# Patient Record
Sex: Female | Born: 1990
Health system: Southern US, Community
[De-identification: ages and names within clinical notes are randomized; demographics above are authoritative.]

## PROBLEM LIST (undated history)

## (undated) DIAGNOSIS — J45909 Unspecified asthma, uncomplicated: Secondary | ICD-10-CM

## (undated) DIAGNOSIS — F32A Depression, unspecified: Secondary | ICD-10-CM

## (undated) DIAGNOSIS — K219 Gastro-esophageal reflux disease without esophagitis: Secondary | ICD-10-CM

## (undated) DIAGNOSIS — F329 Major depressive disorder, single episode, unspecified: Secondary | ICD-10-CM

## (undated) DIAGNOSIS — E282 Polycystic ovarian syndrome: Secondary | ICD-10-CM

## (undated) DIAGNOSIS — F419 Anxiety disorder, unspecified: Secondary | ICD-10-CM

## (undated) DIAGNOSIS — F909 Attention-deficit hyperactivity disorder, unspecified type: Secondary | ICD-10-CM

## (undated) DIAGNOSIS — K589 Irritable bowel syndrome without diarrhea: Secondary | ICD-10-CM

## (undated) HISTORY — DX: Depression, unspecified: F32.A

## (undated) HISTORY — DX: Irritable bowel syndrome, unspecified: K58.9

## (undated) HISTORY — DX: Attention-deficit hyperactivity disorder, unspecified type: F90.9

## (undated) HISTORY — DX: Unspecified asthma, uncomplicated: J45.909

## (undated) HISTORY — DX: Gastro-esophageal reflux disease without esophagitis: K21.9

## (undated) HISTORY — DX: Polycystic ovarian syndrome: E28.2

## (undated) HISTORY — DX: Major depressive disorder, single episode, unspecified: F32.9

---
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
## 2014-03-07 ENCOUNTER — Other Ambulatory Visit: Payer: Self-pay | Admitting: Family Medicine

## 2014-03-07 ENCOUNTER — Other Ambulatory Visit (HOSPITAL_COMMUNITY)
Admission: RE | Admit: 2014-03-07 | Discharge: 2014-03-07 | Disposition: A | Discharge status: Home / Self Care | Payer: Commercial Managed Care - PPO | Source: Ambulatory Visit | Attending: Family Medicine | Admitting: Family Medicine

## 2014-03-07 DIAGNOSIS — Z113 Encounter for screening for infections with a predominantly sexual mode of transmission: Secondary | ICD-10-CM | POA: Diagnosis present

## 2014-03-07 DIAGNOSIS — Z124 Encounter for screening for malignant neoplasm of cervix: Secondary | ICD-10-CM | POA: Insufficient documentation

## 2014-03-08 LAB — CYTOLOGY - PAP

## 2014-10-17 ENCOUNTER — Ambulatory Visit: Payer: Self-pay | Admitting: Family Medicine

## 2014-10-25 ENCOUNTER — Encounter: Payer: Self-pay | Admitting: Family Medicine

## 2014-10-25 ENCOUNTER — Encounter (INDEPENDENT_AMBULATORY_CARE_PROVIDER_SITE_OTHER): Payer: Self-pay

## 2014-10-25 ENCOUNTER — Ambulatory Visit (INDEPENDENT_AMBULATORY_CARE_PROVIDER_SITE_OTHER): Payer: 59 | Admitting: Family Medicine

## 2014-10-25 VITALS — BP 121/72 | HR 88 | Ht 66.0 in | Wt 198.0 lb

## 2014-10-25 DIAGNOSIS — M25562 Pain in left knee: Secondary | ICD-10-CM

## 2014-10-25 NOTE — Patient Instructions (Signed)
You have patellofemoral syndrome and pes planus (flat feet). Avoid painful activities (especially squats and lunges, plyometrics, increasing running mileage) as much as possible. Straight leg raise, hip side raise, straight leg raise with foot turned outwards 3 sets of 10 once a day.  Add ankle weight if these become too easy. Consider formal physical therapy Correct foot breakdown with dr. Jari Sportsmanscholls active series or the sports insoles with scaphoid pads. Avoid barefoot walking, flat shoes as much as possible. Icing 15 minutes at a time 3-4 times a day as needed. Tylenol or aleve as needed for pain Try a small toe spacer on the right foot between 4th and 5th toes. Consider custom orthotics if you do well with these ones also. Follow up with me in 5-6 weeks.

## 2014-10-27 ENCOUNTER — Encounter: Payer: Self-pay | Admitting: Family Medicine

## 2014-10-27 DIAGNOSIS — M25562 Pain in left knee: Secondary | ICD-10-CM | POA: Insufficient documentation

## 2014-10-27 NOTE — Assessment & Plan Note (Signed)
consistent with patellofemoral syndrome.  Has pes planus which is contributing to this and her foot pain.  Shown home exercises to do daily.  Arch support.  Avoid barefoot walking/flat shoes.  Icing, tylenol/nsaids.  Consider custom orthotics, PT if not improving.  F/u in 5-6 weeks.

## 2014-10-27 NOTE — Progress Notes (Signed)
PCP: No primary care provider on file.  Subjective:   HPI: Patient is a 24 y.o. female here for left knee pain.  Patient reports for several months to years she's had off and on left knee pain. No known injury or trauma. No swelling. Pain worse going down stairs and after running. Pain is primarily around kneecap, patellar tendon area. No catching, locking. Has been icing, using heat. Brace helped. ALso with pain in the ball of her feet.  Past Medical History  Diagnosis Date  . IBS (irritable bowel syndrome)     No current outpatient prescriptions on file prior to visit.   No current facility-administered medications on file prior to visit.    No past surgical history on file.  No Known Allergies  History   Social History  . Marital Status: Single    Spouse Name: N/A  . Number of Children: N/A  . Years of Education: N/A   Occupational History  . Not on file.   Social History Main Topics  . Smoking status: Never Smoker   . Smokeless tobacco: Not on file  . Alcohol Use: Not on file  . Drug Use: Not on file  . Sexual Activity: Not on file   Other Topics Concern  . Not on file   Social History Narrative    No family history on file.  BP 121/72 mmHg  Pulse 88  Ht 5\' 6"  (1.676 m)  Wt 198 lb (89.812 kg)  BMI 31.97 kg/m2  Review of Systems: See HPI above.    Objective:  Physical Exam:  Gen: NAD  Pes planus  Left knee: No gross deformity, ecchymoses, effusion.  VMO atrophy No TTP currently. FROM. Negative ant/post drawers. Negative valgus/varus testing. Negative lachmanns. Negative mcmurrays, apleys, patellar apprehension. Hip abduction 4/5 strength. NV intact distally.    Assessment & Plan:  1. Left knee pain - consistent with patellofemoral syndrome.  Has pes planus which is contributing to this and her foot pain.  Shown home exercises to do daily.  Arch support.  Avoid barefoot walking/flat shoes.  Icing, tylenol/nsaids.  Consider custom  orthotics, PT if not improving.  F/u in 5-6 weeks.

## 2014-11-01 ENCOUNTER — Encounter: Payer: Self-pay | Admitting: Family Medicine

## 2014-11-01 ENCOUNTER — Ambulatory Visit (INDEPENDENT_AMBULATORY_CARE_PROVIDER_SITE_OTHER): Payer: 59 | Admitting: Family Medicine

## 2014-11-01 VITALS — BP 116/73 | HR 71 | Ht 66.0 in | Wt 198.0 lb

## 2014-11-01 DIAGNOSIS — M25562 Pain in left knee: Secondary | ICD-10-CM

## 2014-11-02 ENCOUNTER — Encounter: Payer: Self-pay | Admitting: Family Medicine

## 2014-11-02 NOTE — Progress Notes (Signed)
Patient ID: Laura Perez, female   DOB: 27-Nov-1990, 24 y.o.   MRN: [id number] PCP: Cala Bradford, MD  Subjective:   HPI: Patient is a 24 y.o. female here for orthotics.  6/1: Patient reports for several months to years she's had off and on left knee pain. No known injury or trauma. No swelling. Pain worse going down stairs and after running. Pain is primarily around kneecap, patellar tendon area. No catching, locking. Has been icing, using heat. Brace helped. ALso with pain in the ball of her feet.  6/8: Patient reports exercises and sports insoles are helping - would like to get custom orthotics today.  Past Medical History  Diagnosis Date  . IBS (irritable bowel syndrome)     Current Outpatient Prescriptions on File Prior to Visit  Medication Sig Dispense Refill  . hyoscyamine (LEVSIN, ANASPAZ) 0.125 MG tablet Take 0.125 mg by mouth every 4 (four) hours as needed.     No current facility-administered medications on file prior to visit.    History reviewed. No pertinent past surgical history.  No Known Allergies  History   Social History  . Marital Status: Single    Spouse Name: N/A  . Number of Children: N/A  . Years of Education: N/A   Occupational History  . Not on file.   Social History Main Topics  . Smoking status: Never Smoker   . Smokeless tobacco: Not on file  . Alcohol Use: Not on file  . Drug Use: Not on file  . Sexual Activity: Not on file   Other Topics Concern  . Not on file   Social History Narrative    No family history on file.  BP 116/73 mmHg  Pulse 71  Ht 5\' 6"  (1.676 m)  Wt 198 lb (89.812 kg)  BMI 31.97 kg/m2  Review of Systems: See HPI above.    Objective:  Physical Exam:  Gen: NAD  Pes planus No hallux valgus or rigidus. No transverse arch collapse. Leg lengths equal.  Left knee - not reexamined today. No gross deformity, ecchymoses, effusion.  VMO atrophy No TTP currently. FROM. Negative ant/post drawers.  Negative valgus/varus testing. Negative lachmanns. Negative mcmurrays, apleys, patellar apprehension. Hip abduction 4/5 strength. NV intact distally.    Assessment & Plan:  1. Patellofemoral syndrome, pes planus, gait abnormality - custom orthotics made today. Patient was fitted for a : standard, cushioned, semi-rigid orthotic. The orthotic was heated and afterward the patient stood on the orthotic blank positioned on the orthotic stand. The patient was positioned in subtalar neutral position and 10 degrees of ankle dorsiflexion in a weight bearing stance. After completion of molding, a stable base was applied to the orthotic blank. The blank was ground to a stable position for weight bearing. Size: 8  Base: blue med density eva Posting: none Additional orthotic padding: none Total prep time 40 minutes.

## 2014-11-02 NOTE — Assessment & Plan Note (Signed)
With pes planus, gait abnormality - custom orthotics made today. Patient was fitted for a : standard, cushioned, semi-rigid orthotic. The orthotic was heated and afterward the patient stood on the orthotic blank positioned on the orthotic stand. The patient was positioned in subtalar neutral position and 10 degrees of ankle dorsiflexion in a weight bearing stance. After completion of molding, a stable base was applied to the orthotic blank. The blank was ground to a stable position for weight bearing. Size: 8  Base: blue med density eva Posting: none Additional orthotic padding: none Total prep time 40 minutes.

## 2014-11-10 ENCOUNTER — Encounter: Payer: Self-pay | Admitting: Internal Medicine

## 2014-12-11 ENCOUNTER — Encounter: Payer: Self-pay | Admitting: Emergency Medicine

## 2014-12-11 ENCOUNTER — Emergency Department
Admission: EM | Admit: 2014-12-11 | Discharge: 2014-12-11 | Disposition: A | Discharge status: Home / Self Care | Payer: 59 | Source: Home / Self Care

## 2014-12-11 DIAGNOSIS — H9202 Otalgia, left ear: Secondary | ICD-10-CM

## 2014-12-11 DIAGNOSIS — H6982 Other specified disorders of Eustachian tube, left ear: Secondary | ICD-10-CM | POA: Diagnosis not present

## 2014-12-11 HISTORY — DX: Anxiety disorder, unspecified: F41.9

## 2014-12-11 MED ORDER — HYDROCORTISONE-ACETIC ACID 1-2 % OT SOLN: 4.0000 [drp] | Freq: Three times a day (TID) | OTIC | Status: DC

## 2014-12-11 MED ORDER — PREDNISONE 20 MG PO TABS: 20.0000 mg | ORAL_TABLET | Freq: Two times a day (BID) | ORAL | Status: DC

## 2014-12-11 NOTE — ED Provider Notes (Signed)
CSN: [id number]     Arrival date & time 12/11/14  1429 History   None    Chief Complaint  Patient presents with  . Otalgia      HPI Comments: Patient complains of itching and pain in her left ear during the past three days.  No nasal congestion, sore throat, or fever.  She reports that she had been swimming last week.  No drainage from her left ear canal.  The history is provided by the patient.    Past Medical History  Diagnosis Date  . Anxiety   . IBS (irritable bowel syndrome)    History reviewed. No pertinent past surgical history. No family history on file. History  Substance Use Topics  . Smoking status: Never Smoker   . Smokeless tobacco: Not on file  . Alcohol Use: No   OB History    No data available     Review of Systems No sore throat No cough No pleuritic pain No wheezing No nasal congestion No post-nasal drainage No sinus pain/pressure No itchy/red eyes ? earache No hemoptysis No SOB No fever/chills No nausea No vomiting No abdominal pain No diarrhea No urinary symptoms No skin rash No fatigue No myalgias No headache    Allergies  Review of patient's allergies indicates no known allergies.  Home Medications   Prior to Admission medications   Medication Sig Start Date End Date Taking? Authorizing Provider  hyoscyamine (LEVSIN, ANASPAZ) 0.125 MG tablet Take 0.125 mg by mouth every 4 (four) hours as needed.   Yes Historical Provider, MD  acetic acid-hydrocortisone (VOSOL-HC) otic solution Place 4 drops into the left ear 3 (three) times daily. Use for 3 to 5 days until itching resolves. 12/11/14   Lattie HawStephen A Zosia Lucchese, MD  predniSONE (DELTASONE) 20 MG tablet Take 1 tablet (20 mg total) by mouth 2 (two) times daily. Take with food. 12/11/14   Lattie HawStephen A Tierra Divelbiss, MD   BP 117/75 mmHg  Pulse 75  Temp(Src) 98.1 F (36.7 C) (Oral)  Ht 5\' 6"  (1.676 m)  Wt 211 lb (95.709 kg)  BMI 34.07 kg/m2  SpO2 98%  LMP 11/09/2014 Physical Exam Nursing notes and  Vital Signs reviewed. Appearance:  Patient appears stated age, and in no acute distress Eyes:  Pupils are equal, round, and reactive to light and accomodation.  Extraocular movement is intact.  Conjunctivae are not inflamed  Ears:  Canals normal although there is mild tenderness with insertion of speculum into left distal canal.  Tympanic membranes normal.  Nose:  Congested turbinates.  No sinus tenderness.   Mouth/Pharynx:  Normal Neck:  Supple.  No adenopathy Skin:  No rash present.   ED Course  Procedures    Labs Reviewed -   Tympanogram:  Wide left ear; positive peak pressure right ear    MDM   1. Otalgia of left ear; ?mild otitis externa   2. Eustachian tube dysfunction, left    Begin prednisone burst.  Rx for VoSol otic solution. May use Afrin nasal spray (or generic oxymetazoline) twice daily for about 5 days.  Also recommend using saline nasal spray several times daily and saline nasal irrigation (AYR is a common brand).  Use Flonase nasal spray each morning after using Afrin nasal spray and saline nasal irrigation. Followup with ENT if not improving.    Lattie HawStephen A Genever Hentges, MD 12/16/14 [phone]

## 2014-12-11 NOTE — ED Notes (Signed)
Left ear pain and itching x 3 days

## 2014-12-11 NOTE — Discharge Instructions (Signed)
May use Afrin nasal spray (or generic oxymetazoline) twice daily for about 5 days.  Also recommend using saline nasal spray several times daily and saline nasal irrigation (AYR is a common brand).  Use Flonase nasal spray each morning after using Afrin nasal spray and saline nasal irrigation.

## 2015-01-10 ENCOUNTER — Encounter: Payer: Self-pay | Admitting: Emergency Medicine

## 2015-01-18 ENCOUNTER — Ambulatory Visit: Payer: Commercial Managed Care - PPO | Admitting: Internal Medicine

## 2015-05-08 ENCOUNTER — Encounter: Payer: Self-pay | Admitting: *Deleted

## 2015-05-08 ENCOUNTER — Emergency Department
Admission: EM | Admit: 2015-05-08 | Discharge: 2015-05-08 | Disposition: A | Discharge status: Home / Self Care | Payer: 59 | Source: Home / Self Care | Attending: Family Medicine | Admitting: Family Medicine

## 2015-05-08 DIAGNOSIS — J0100 Acute maxillary sinusitis, unspecified: Secondary | ICD-10-CM | POA: Diagnosis not present

## 2015-05-08 DIAGNOSIS — H6593 Unspecified nonsuppurative otitis media, bilateral: Secondary | ICD-10-CM

## 2015-05-08 DIAGNOSIS — J069 Acute upper respiratory infection, unspecified: Secondary | ICD-10-CM | POA: Diagnosis not present

## 2015-05-08 MED ORDER — BENZONATATE 100 MG PO CAPS: 100.0000 mg | ORAL_CAPSULE | Freq: Three times a day (TID) | ORAL | Status: DC

## 2015-05-08 MED ORDER — AMOXICILLIN 400 MG/5ML PO SUSR: 875.0000 mg | Freq: Two times a day (BID) | ORAL | Status: DC

## 2015-05-08 MED ORDER — HYDROCODONE-HOMATROPINE 5-1.5 MG/5ML PO SYRP: 5.0000 mL | ORAL_SOLUTION | Freq: Four times a day (QID) | ORAL | Status: DC | PRN

## 2015-05-08 NOTE — ED Notes (Signed)
Pt c/o productive cough, sneezing, facial pressure, and chest hurts x 4 days. Denies fever.

## 2015-05-08 NOTE — ED Provider Notes (Signed)
CSN: [id number]     Arrival date & time 05/08/15  1615 History   First MD Initiated Contact with Patient 05/08/15 1632     Chief Complaint  Patient presents with  . Cough   (Consider location/radiation/quality/duration/timing/severity/associated sxs/prior Treatment) HPI  Pt is a 24yo female [facility] with c/o mild to moderately productive cough that is gradually worsening. Associated nasal congestion, facial pain and pressure, and fatigue. Cough keeps her up at night.  Chest soreness when she coughs. Symptoms started 4 days ago. She has tried OTC medications w/o relief. No sick contacts or recent travel. Denies difficulty breathing at this time.   Past Medical History  Diagnosis Date  . Anxiety   . IBS (irritable bowel syndrome)    History reviewed. No pertinent past surgical history. History reviewed. No pertinent family history. Social History  Substance Use Topics  . Smoking status: Never Smoker   . Smokeless tobacco: None  . Alcohol Use: No   OB History    Gravida Para Term Preterm AB TAB SAB Ectopic Multiple Living   0 0 0 0 0 0 0 0       Review of Systems  Constitutional: Positive for chills and fatigue. Negative for fever.  HENT: Positive for congestion, postnasal drip, rhinorrhea, sinus pressure, sneezing, sore throat and voice change. Negative for ear pain and trouble swallowing.   Respiratory: Positive for cough and wheezing. Negative for shortness of breath.   Cardiovascular: Negative for chest pain and palpitations.  Gastrointestinal: Negative for nausea, vomiting, abdominal pain and diarrhea.  Musculoskeletal: Positive for myalgias and arthralgias. Negative for back pain.  Skin: Negative for rash.  Neurological: Positive for headaches. Negative for dizziness and light-headedness.    Allergies  Review of patient's allergies indicates no known allergies.  Home Medications   Prior to Admission medications   Medication Sig Start Date End Date Taking?  Authorizing Provider  cloNIDine (CATAPRES) 0.2 MG tablet Take 0.2 mg by mouth 2 (two) times daily.   Yes Historical Provider, MD  escitalopram (LEXAPRO) 10 MG tablet Take 10 mg by mouth daily.   Yes Historical Provider, MD  lisdexamfetamine (VYVANSE) 50 MG capsule Take 50 mg by mouth daily.   Yes Historical Provider, MD  topiramate (TOPAMAX) 50 MG tablet Take 50 mg by mouth 2 (two) times daily.   Yes Historical Provider, MD  amoxicillin (AMOXIL) 400 MG/5ML suspension Take 10.9 mLs (875 mg total) by mouth 2 (two) times daily. For 7 days 05/08/15   Junius FinnerErin O'Malley, PA-C  benzonatate (TESSALON) 100 MG capsule Take 1 capsule (100 mg total) by mouth every 8 (eight) hours. 05/08/15   Junius FinnerErin O'Malley, PA-C  HYDROcodone-homatropine (HYCODAN) 5-1.5 MG/5ML syrup Take 5 mLs by mouth every 6 (six) hours as needed for cough. 05/08/15   Junius FinnerErin O'Malley, PA-C  hyoscyamine (LEVSIN, ANASPAZ) 0.125 MG tablet Take 0.125 mg by mouth every 4 (four) hours as needed.    Historical Provider, MD   Meds Ordered and Administered this Visit  Medications - No data to display  BP 139/86 mmHg  Pulse 96  Temp(Src) 98.1 F (36.7 C) (Oral)  Resp 16  Ht 5\' 6"  (1.676 m)  Wt 219 lb (99.338 kg)  BMI 35.36 kg/m2  SpO2 98%  LMP 10/25/2014 No data found.   Physical Exam  Constitutional: She appears well-developed and well-nourished. No distress.  HENT:  Head: Normocephalic and atraumatic.  Right Ear: Hearing, tympanic membrane, external ear and ear canal normal.  Left Ear: Hearing, tympanic membrane,  external ear and ear canal normal.  Nose: Mucosal edema present. Right sinus exhibits maxillary sinus tenderness and frontal sinus tenderness. Left sinus exhibits maxillary sinus tenderness and frontal sinus tenderness.  Mouth/Throat: Uvula is midline and mucous membranes are normal. Posterior oropharyngeal erythema present. No oropharyngeal exudate, posterior oropharyngeal edema or tonsillar abscesses.  Eyes: Conjunctivae are  normal. No scleral icterus.  Neck: Normal range of motion.  Cardiovascular: Normal rate, regular rhythm and normal heart sounds.   Pulmonary/Chest: Effort normal and breath sounds normal. No respiratory distress. She has no wheezes. She has no rales. She exhibits no tenderness.  Abdominal: Soft. She exhibits no distension and no mass. There is no tenderness. There is no rebound and no guarding.  Musculoskeletal: Normal range of motion.  Neurological: She is alert.  Skin: Skin is warm and dry. She is not diaphoretic.  Nursing note and vitals reviewed.   ED Course  Procedures (including critical care time)  Labs Review Labs Reviewed - No data to display  Imaging Review No results found.     MDM   1. Acute maxillary sinusitis, recurrence not specified   2. Middle ear effusion, bilateral   3. Acute upper respiratory infection    Pt c/o facial pain with worsening cough. Exam c/w sinusitis secondary to URI. Rx: amoxicillin, tessalon, hycodan.  Advised pt to use acetaminophen and ibuprofen as needed for fever and pain. Encouraged rest and fluids. F/u with PCP in 7-10 days if not improving, sooner if worsening. Pt verbalized understanding and agreement with tx plan.     Junius Finner, PA-C 05/10/15 [phone]

## 2015-05-08 NOTE — Discharge Instructions (Signed)
Hycodan is a narcotic pain and cough medication.  While taking, do not drink alcohol, drive, or perform any other activities that requires focus while taking these medications.   You may take 400-600mg  Ibuprofen (Motrin) every 6-8 hours for fever and pain  Alternate with Tylenol  You may take 500mg  Tylenol every 4-6 hours as needed for fever and pain  Follow-up with your primary care provider next week for recheck of symptoms if not improving.  Be sure to drink plenty of fluids and rest, at least 8hrs of sleep a night, preferably more while you are sick. Return urgent care or go to closest ER if you cannot keep down fluids/signs of dehydration, fever not reducing with Tylenol, difficulty breathing/wheezing, stiff neck, worsening condition, or other concerns (see below)  Please take antibiotics as prescribed and be sure to complete entire course even if you start to feel better to ensure infection does not come back.  Cool Mist Vaporizers Vaporizers may help relieve the symptoms of a cough and cold. They add moisture to the air, which helps mucus to become thinner and less sticky. This makes it easier to breathe and cough up secretions. Cool mist vaporizers do not cause serious burns like hot mist vaporizers, which may also be called steamers or humidifiers. Vaporizers have not been proven to help with colds. You should not use a vaporizer if you are allergic to mold. HOME CARE INSTRUCTIONS  Follow the package instructions for the vaporizer.  Do not use anything other than distilled water in the vaporizer.  Do not run the vaporizer all of the time. This can cause mold or bacteria to grow in the vaporizer.  Clean the vaporizer after each time it is used.  Clean and dry the vaporizer well before storing it.  Stop using the vaporizer if worsening respiratory symptoms develop.   This information is not intended to replace advice given to you by your health care provider. Make sure you discuss  any questions you have with your health care provider.   Document Released: 02/07/2004 Document Revised: 05/17/2013 Document Reviewed: 09/29/2012 Elsevier Interactive Patient Education [employer]

## 2015-05-11 DIAGNOSIS — N912 Amenorrhea, unspecified: Secondary | ICD-10-CM | POA: Insufficient documentation

## 2015-05-31 MED FILL — TOPIRAMATE 50 MG TABLET: 50 | 30 days supply | Qty: 30 | Fill #1

## 2015-06-01 DIAGNOSIS — E669 Obesity, unspecified: Secondary | ICD-10-CM | POA: Diagnosis not present

## 2015-06-01 DIAGNOSIS — N911 Secondary amenorrhea: Secondary | ICD-10-CM | POA: Diagnosis not present

## 2015-06-01 DIAGNOSIS — Z6835 Body mass index (BMI) 35.0-35.9, adult: Secondary | ICD-10-CM | POA: Diagnosis not present

## 2015-06-01 DIAGNOSIS — E282 Polycystic ovarian syndrome: Secondary | ICD-10-CM | POA: Diagnosis not present

## 2015-06-07 MED FILL — LORYNA 3-0.02 MG TAB: 3-0.02 | 84 days supply | Qty: 84 | Fill #0

## 2015-07-03 MED FILL — TOPIRAMATE 50 MG TABLET: 50 | 30 days supply | Qty: 30 | Fill #2

## 2015-07-03 MED FILL — ESCITALOPRAM 10 MG TABLET: 10 | 30 days supply | Qty: 45 | Fill #1

## 2015-07-12 MED FILL — VYVANSE 50 MG CAPSULE: 50 | 30 days supply | Qty: 30 | Fill #0

## 2015-07-12 MED FILL — ANGELIQ 0.25 MG-0.5 MG TAB: 0.25-0.5 | 28 days supply | Qty: 28 | Fill #0

## 2015-07-17 DIAGNOSIS — Z309 Encounter for contraceptive management, unspecified: Secondary | ICD-10-CM | POA: Diagnosis not present

## 2015-07-17 DIAGNOSIS — R102 Pelvic and perineal pain: Secondary | ICD-10-CM | POA: Diagnosis not present

## 2015-07-17 DIAGNOSIS — R300 Dysuria: Secondary | ICD-10-CM | POA: Diagnosis not present

## 2015-07-17 DIAGNOSIS — E282 Polycystic ovarian syndrome: Secondary | ICD-10-CM | POA: Diagnosis not present

## 2015-07-23 DIAGNOSIS — R102 Pelvic and perineal pain: Secondary | ICD-10-CM | POA: Diagnosis not present

## 2015-07-24 MED FILL — HYOSCYAMINE 0.125 MG TAB SL: 0.125 | 5 days supply | Qty: 30 | Fill #0

## 2015-07-30 MED FILL — ESCITALOPRAM 10 MG TABLET: 10 | 30 days supply | Qty: 45 | Fill #2

## 2015-08-13 MED FILL — TOPIRAMATE 50 MG TABLET: 50 | 30 days supply | Qty: 30 | Fill #3

## 2015-08-13 MED FILL — ANGELIQ 0.25 MG-0.5 MG TAB: 0.25-0.5 | 28 days supply | Qty: 28 | Fill #1

## 2015-08-16 DIAGNOSIS — F901 Attention-deficit hyperactivity disorder, predominantly hyperactive type: Secondary | ICD-10-CM | POA: Diagnosis not present

## 2015-08-17 MED FILL — FLUVOXAMINE MALEATE 100 MG: 100 | 30 days supply | Qty: 30 | Fill #0

## 2015-09-04 MED FILL — VYVANSE 50 MG CAPSULE: 50 | 30 days supply | Qty: 30 | Fill #0

## 2015-09-04 MED FILL — TOPIRAMATE 50 MG TABLET: 50 | 30 days supply | Qty: 30 | Fill #0

## 2015-09-04 MED FILL — ANGELIQ 0.25 MG-0.5 MG TAB: 0.25-0.5 | 28 days supply | Qty: 28 | Fill #2

## 2015-09-24 MED FILL — FLUVOXAMINE MALEATE 100 MG: 100 | 30 days supply | Qty: 30 | Fill #1

## 2015-10-03 MED FILL — ANGELIQ 0.25 MG-0.5 MG TAB: 0.25-0.5 | 28 days supply | Qty: 28 | Fill #3

## 2015-10-18 MED FILL — FLUVOXAMINE MALEATE 100 MG: 100 | 30 days supply | Qty: 30 | Fill #2

## 2015-10-18 MED FILL — TOPIRAMATE 50 MG TABLET: 50 | 30 days supply | Qty: 30 | Fill #1

## 2015-10-29 MED FILL — ANGELIQ 0.25 MG-0.5 MG TAB: 0.25-0.5 | 28 days supply | Qty: 28 | Fill #4

## 2015-11-08 DIAGNOSIS — F901 Attention-deficit hyperactivity disorder, predominantly hyperactive type: Secondary | ICD-10-CM | POA: Diagnosis not present

## 2015-11-08 MED FILL — FLUVOXAMINE MALEATE 100 MG: 100 | 30 days supply | Qty: 60 | Fill #0

## 2015-11-08 MED FILL — TOPIRAMATE 100 MG TABLET: 100 | 30 days supply | Qty: 30 | Fill #0

## 2015-11-08 MED FILL — VYVANSE 70 MG CAPSULE: 70 | 30 days supply | Qty: 30 | Fill #0

## 2015-12-03 MED FILL — VYVANSE 70 MG CAPSULE: 70 | 30 days supply | Qty: 30 | Fill #0

## 2015-12-03 MED FILL — ANGELIQ 0.25 MG-0.5 MG TAB: 0.25-0.5 | 28 days supply | Qty: 28 | Fill #5

## 2015-12-03 MED FILL — TOPIRAMATE 100 MG TABLET: 100 | 30 days supply | Qty: 30 | Fill #1

## 2015-12-03 MED FILL — FLUVOXAMINE MALEATE 100 MG: 100 | 30 days supply | Qty: 60 | Fill #1

## 2015-12-24 MED FILL — ANGELIQ 0.25 MG-0.5 MG TAB: 0.25-0.5 | 28 days supply | Qty: 28 | Fill #6

## 2016-01-03 DIAGNOSIS — F901 Attention-deficit hyperactivity disorder, predominantly hyperactive type: Secondary | ICD-10-CM | POA: Diagnosis not present

## 2016-01-04 MED FILL — BUPROPION HCL XL 150 MG TAB: 150 | 30 days supply | Qty: 30 | Fill #0

## 2016-01-07 DIAGNOSIS — L0231 Cutaneous abscess of buttock: Secondary | ICD-10-CM | POA: Diagnosis not present

## 2016-01-07 MED FILL — AMOX-CLAV 875-125 MG TABLET: 875-125 | 10 days supply | Qty: 20 | Fill #0

## 2016-01-08 MED FILL — FLUVOXAMINE MALEATE 100 MG: 100 | 30 days supply | Qty: 60 | Fill #2

## 2016-01-08 MED FILL — TOPIRAMATE 100 MG TABLET: 100 | 30 days supply | Qty: 30 | Fill #2

## 2016-01-21 MED FILL — ANGELIQ 0.25 MG-0.5 MG TAB: 0.25-0.5 | 28 days supply | Qty: 28 | Fill #7

## 2016-02-04 MED FILL — BUPROPION HCL XL 150 MG TAB: 150 | 30 days supply | Qty: 30 | Fill #1

## 2016-02-04 MED FILL — TOPIRAMATE 50 MG TABLET: 50 | 30 days supply | Qty: 30 | Fill #0

## 2016-02-11 MED FILL — FLUVOXAMINE MALEATE 100 MG: 100 | 30 days supply | Qty: 60 | Fill #0

## 2016-02-26 MED FILL — TOPIRAMATE 50 MG TABLET: 50 | 30 days supply | Qty: 30 | Fill #1

## 2016-02-27 MED FILL — ANGELIQ 0.25 MG-0.5 MG TAB: 0.25-0.5 | 28 days supply | Qty: 28 | Fill #8

## 2016-03-10 DIAGNOSIS — J029 Acute pharyngitis, unspecified: Secondary | ICD-10-CM | POA: Diagnosis not present

## 2016-03-10 MED FILL — AMOXICILLIN 875 MG TABLET: 875 | 10 days supply | Qty: 20 | Fill #0

## 2016-03-17 MED FILL — TOPIRAMATE 50 MG TABLET: 50 | 30 days supply | Qty: 30 | Fill #2

## 2016-03-17 MED FILL — BUPROPION HCL XL 150 MG TAB: 150 | 30 days supply | Qty: 30 | Fill #2

## 2016-03-17 MED FILL — VYVANSE 70 MG CAPSULE: 70 | 30 days supply | Qty: 30 | Fill #0

## 2016-03-17 MED FILL — FLUVOXAMINE MALEATE 100 MG: 100 | 30 days supply | Qty: 60 | Fill #1

## 2016-03-19 DIAGNOSIS — H52223 Regular astigmatism, bilateral: Secondary | ICD-10-CM | POA: Diagnosis not present

## 2016-03-27 DIAGNOSIS — F901 Attention-deficit hyperactivity disorder, predominantly hyperactive type: Secondary | ICD-10-CM | POA: Diagnosis not present

## 2016-03-28 MED FILL — ANGELIQ 0.25 MG-0.5 MG TAB: 0.25-0.5 | 28 days supply | Qty: 28 | Fill #9

## 2016-03-28 MED FILL — BUPROPION HCL XL 300 MG TAB: 300 | 30 days supply | Qty: 30 | Fill #0

## 2016-04-09 ENCOUNTER — Encounter: Payer: Self-pay | Admitting: Emergency Medicine

## 2016-04-09 ENCOUNTER — Emergency Department
Admission: EM | Admit: 2016-04-09 | Discharge: 2016-04-09 | Disposition: A | Discharge status: Home / Self Care | Payer: 59 | Source: Home / Self Care | Attending: Family Medicine | Admitting: Family Medicine

## 2016-04-09 DIAGNOSIS — R112 Nausea with vomiting, unspecified: Secondary | ICD-10-CM

## 2016-04-09 DIAGNOSIS — R197 Diarrhea, unspecified: Secondary | ICD-10-CM

## 2016-04-09 MED ORDER — ONDANSETRON 4 MG PO TBDP: ORAL_TABLET | ORAL | 0 refills | Status: AC

## 2016-04-09 MED FILL — ONDANSETRON ODT 4 MG TABLET: 4 | 3 days supply | Qty: 12 | Fill #0

## 2016-04-09 NOTE — Discharge Instructions (Signed)
Begin clear liquids (Pedialyte while having diarrhea) until improved, then advance to a [employer] diet (Bananas, Rice, Applesauce, Toast).  Then gradually resume a regular diet when tolerated.  Avoid milk products until. When stools become more formed, may take Imodium (loperamide) once or twice daily to decrease stool frequency.  If symptoms become significantly worse during the night or over the weekend, proceed to the local emergency room.

## 2016-04-09 NOTE — ED Provider Notes (Signed)
[facility]    CSN: [id number] Arrival date & time: 04/09/16  1308     History   Chief Complaint Chief Complaint  Patient presents with  . Emesis    HPI Laura Perez is a 25 y.o. female.   Patient states that she has been under increased stress, and had some loose stools yesterday.  At 2am today she developed nausea, fatigue, and low grade fever.  This morning she vomited at 4am, and has had nausea since then.  No abdominal pain.   The history is provided by the patient.  Emesis  Duration:  9 hours Timing:  Sporadic Quality:  Stomach contents Able to tolerate:  Liquids Progression:  Resolved Chronicity:  New Recent urination:  Normal Relieved by:  Nothing Exacerbated by: PeptoBismol. Associated symptoms: chills, diarrhea and fever   Associated symptoms: no abdominal pain, no arthralgias, no cough, no headaches, no myalgias, no sore throat and no URI   Diarrhea:    Quality:  Watery   Severity:  Mild   Timing:  Sporadic   Progression:  Resolved   Past Medical History:  Diagnosis Date  . Anxiety   . IBS (irritable bowel syndrome)     Patient Active Problem List   Diagnosis Date Noted  . Left knee pain 10/27/2014    History reviewed. No pertinent surgical history.  OB History    Gravida Para Term Preterm AB Living   0 0 0 0 0     SAB TAB Ectopic Multiple Live Births   0 0 0           Home Medications    Prior to Admission medications   Medication Sig Start Date End Date Taking? Authorizing Provider  amoxicillin (AMOXIL) 400 MG/5ML suspension Take 10.9 mLs (875 mg total) by mouth 2 (two) times daily. For 7 days 05/08/15   Junius Finner, PA-C  benzonatate (TESSALON) 100 MG capsule Take 1 capsule (100 mg total) by mouth every 8 (eight) hours. 05/08/15   Junius Finner, PA-C  cloNIDine (CATAPRES) 0.2 MG tablet Take 0.2 mg by mouth 2 (two) times daily.    Historical Provider, MD  escitalopram (LEXAPRO) 10 MG tablet Take 10 mg by mouth  daily.    Historical Provider, MD  HYDROcodone-homatropine (HYCODAN) 5-1.5 MG/5ML syrup Take 5 mLs by mouth every 6 (six) hours as needed for cough. 05/08/15   Junius Finner, PA-C  hyoscyamine (LEVSIN, ANASPAZ) 0.125 MG tablet Take 0.125 mg by mouth every 4 (four) hours as needed.    Historical Provider, MD  lisdexamfetamine (VYVANSE) 50 MG capsule Take 50 mg by mouth daily.    Historical Provider, MD  ondansetron (ZOFRAN ODT) 4 MG disintegrating tablet Take one tab by mouth Q6hr prn nausea (Dissolve under tongue) 04/09/16   Lattie Haw, MD  topiramate (TOPAMAX) 50 MG tablet Take 50 mg by mouth 2 (two) times daily.    Historical Provider, MD    Family History No family history on file.  Social History Social History  Substance Use Topics  . Smoking status: Never Smoker  . Smokeless tobacco: Never Used  . Alcohol use No     Allergies   Patient has no known allergies.   Review of Systems Review of Systems  Constitutional: Positive for chills and fever.  HENT: Negative for sore throat.   Respiratory: Negative for cough.   Gastrointestinal: Positive for diarrhea and vomiting. Negative for abdominal pain.  Musculoskeletal: Negative for arthralgias and myalgias.  Neurological: Negative for headaches.  All other systems reviewed and are negative.    Physical Exam Triage Vital Signs ED Triage Vitals  Enc Vitals Group     BP 04/09/16 1327 131/78     Pulse Rate 04/09/16 1327 93     Resp --      Temp 04/09/16 1327 97.9 F (36.6 C)     Temp Source 04/09/16 1327 Oral     SpO2 04/09/16 1327 99 %     Weight 04/09/16 1327 218 lb (98.9 kg)     Height 04/09/16 1327 5\' 6"  (1.676 m)     Head Circumference --      Peak Flow --      Pain Score 04/09/16 1333 0     Pain Loc --      Pain Edu? --      Excl. in GC? --    No data found.   Updated Vital Signs BP 131/78 (BP Location: Left Arm)   Pulse 93   Temp 97.9 F (36.6 C) (Oral)   Ht 5\' 6"  (1.676 m)   Wt 218 lb (98.9 kg)    LMP 04/02/2016 (Approximate)   SpO2 99%   BMI 35.19 kg/m   Visual Acuity Right Eye Distance:   Left Eye Distance:   Bilateral Distance:    Right Eye Near:   Left Eye Near:    Bilateral Near:     Physical Exam Nursing notes and Vital Signs reviewed. Appearance:  Patient appears stated age, and in no acute distress Eyes:  Pupils are equal, round, and reactive to light and accomodation.  Extraocular movement is intact.  Conjunctivae are not inflamed  Ears:  Canals normal.  Tympanic membranes normal.  Nose: Normal turbinates.  No sinus tenderness.  Pharynx:  Normal; moist mucous membranes  Neck:  Supple.  No adenopathy. Lungs:  Clear to auscultation.  Breath sounds are equal.  Moving air well. Heart:  Regular rate and rhythm without murmurs, rubs, or gallops.  Abdomen:  Nontender without masses or hepatosplenomegaly.  Bowel sounds are present.  No CVA or flank tenderness.  Extremities:  No edema.  Skin:  No rash present.    UC Treatments / Results  Labs (all labs ordered are listed, but only abnormal results are displayed) Labs Reviewed - No data to display  EKG  EKG Interpretation None       Radiology No results found.  Procedures Procedures (including critical care time)  Medications Ordered in UC Medications - No data to display   Initial Impression / Assessment and Plan / UC Course  I have reviewed the triage vital signs and the nursing notes.  Pertinent labs & imaging results that were available during my care of the patient were reviewed by me and considered in my medical decision making (see chart for details).  Clinical Course   Suspect viral gastroenteritis. Administered Zofran ODT 4mg  PO; given Rx for same. Begin clear liquids (Pedialyte while having diarrhea) until improved, then advance to a [employer] diet (Bananas, Rice, Applesauce, Toast).  Then gradually resume a regular diet when tolerated.  Avoid milk products until. When stools become more formed,  may take Imodium (loperamide) once or twice daily to decrease stool frequency.  If symptoms become significantly worse during the night or over the weekend, proceed to the local emergency room.  Followup with Family Doctor if not improved in about 5 days.     Final Clinical Impressions(s) / UC Diagnoses   Final diagnoses:  Non-intractable vomiting with nausea, unspecified  vomiting type  Diarrhea, unspecified type    New Prescriptions New Prescriptions   ONDANSETRON (ZOFRAN ODT) 4 MG DISINTEGRATING TABLET    Take one tab by mouth Q6hr prn nausea (Dissolve under tongue)     Lattie HawStephen A Beese, MD 04/12/16 [phone]

## 2016-04-09 NOTE — ED Triage Notes (Signed)
Diarrhea yesterday, nausea last night vomited x 4 this am

## 2016-04-15 MED FILL — TOPIRAMATE 50 MG TABLET: 50 | 30 days supply | Qty: 30 | Fill #3

## 2016-05-01 ENCOUNTER — Encounter (INDEPENDENT_AMBULATORY_CARE_PROVIDER_SITE_OTHER): Payer: Self-pay | Admitting: Family Medicine

## 2016-05-13 ENCOUNTER — Ambulatory Visit (INDEPENDENT_AMBULATORY_CARE_PROVIDER_SITE_OTHER): Payer: 59 | Admitting: Family Medicine

## 2016-05-13 ENCOUNTER — Encounter (INDEPENDENT_AMBULATORY_CARE_PROVIDER_SITE_OTHER): Payer: Self-pay | Admitting: Family Medicine

## 2016-05-13 VITALS — BP 119/82 | HR 112 | Temp 99.2°F | Resp 10 | Ht 66.0 in | Wt 217.8 lb

## 2016-05-13 DIAGNOSIS — Z6835 Body mass index (BMI) 35.0-35.9, adult: Secondary | ICD-10-CM | POA: Diagnosis not present

## 2016-05-13 DIAGNOSIS — R000 Tachycardia, unspecified: Secondary | ICD-10-CM | POA: Diagnosis not present

## 2016-05-13 DIAGNOSIS — R5383 Other fatigue: Secondary | ICD-10-CM

## 2016-05-13 DIAGNOSIS — Z1389 Encounter for screening for other disorder: Secondary | ICD-10-CM | POA: Diagnosis not present

## 2016-05-13 DIAGNOSIS — R0602 Shortness of breath: Secondary | ICD-10-CM | POA: Diagnosis not present

## 2016-05-13 DIAGNOSIS — Z9189 Other specified personal risk factors, not elsewhere classified: Secondary | ICD-10-CM

## 2016-05-13 DIAGNOSIS — E282 Polycystic ovarian syndrome: Secondary | ICD-10-CM | POA: Diagnosis not present

## 2016-05-13 DIAGNOSIS — Z1331 Encounter for screening for depression: Secondary | ICD-10-CM

## 2016-05-13 DIAGNOSIS — F902 Attention-deficit hyperactivity disorder, combined type: Secondary | ICD-10-CM | POA: Insufficient documentation

## 2016-05-13 DIAGNOSIS — J309 Allergic rhinitis, unspecified: Secondary | ICD-10-CM | POA: Insufficient documentation

## 2016-05-13 DIAGNOSIS — E669 Obesity, unspecified: Secondary | ICD-10-CM

## 2016-05-13 DIAGNOSIS — F419 Anxiety disorder, unspecified: Secondary | ICD-10-CM | POA: Insufficient documentation

## 2016-05-13 NOTE — Progress Notes (Signed)
Office: [phone]  /  Fax: [phone]   HPI:   Chief Complaint: OBESITY  Laura Perez (MR# [id number]) is a 25 y.o. female who presents on 05/13/2016 for obesity evaluation and treatment. Current BMI is Body mass index is 35.15 kg/m.Marland Kitchen. Laura Perez has struggled with obesity for years and has been unsuccessful in either losing weight or maintaining long term weight loss. Laura Perez attended our information session and states she is currently in the action stage of change and ready to dedicate time achieving and maintaining a healthier weight.  Ciani states she struggles with family and or coworkers weight loss sabotage she has significant food cravings issues  she is frequently drinking liquids with calories she frequently makes poor food choices she frequently eats larger portions than normal  she has binge eating behaviors she struggles with emotional eating    Fatigue Laura Perez feels her energy is lower than it should be. This has worsened with weight gain and has not worsened recently. Laura Perez reports daytime somnolence and  reports waking up still tired. Patient is at risk for obstructive sleep apnea. Patent has a history of symptoms of daytime fatigue. Patient generally gets 7 hours of sleep per night, and states they generally have nightime awakenings. Snoring is present. Apneic episodes are not present. Epworth Sleepiness Score is 2   Dyspnea on exertion Laura Perez notes increasing shortness of breath with exercising and seems to be worsening over time with weight gain. She notes getting out of breath sooner with activity than she used to. This has not gotten worse recently.  PCOS Pt has a hx of PCOS, not on metformin, on BCP, notes hirsutism and irregular periods.   Tachycardia Pt has Sinus Tach at 101 on EKG. Is on Vyvance and had her Wellbutrin increased about 3 months ago, notes worsening anxiety and irritability since then. No CB  Depression Screen Laura Perez Food and Mood  (modified PHQ-9) score was  Depression screen PHQ 2/9 05/13/2016  Decreased Interest 1  Down, Depressed, Hopeless 3  PHQ - 2 Score 4  Altered sleeping 2  Tired, decreased energy 1  Change in appetite 3  Feeling bad or failure about yourself  3  Trouble concentrating 2  Moving slowly or fidgety/restless 0  Suicidal thoughts 1  PHQ-9 Score 16    ALLERGIES: No Known Allergies  MEDICATIONS: Current Outpatient Prescriptions on File Prior to Visit  Medication Sig Dispense Refill  . escitalopram (LEXAPRO) 10 MG tablet Take 10 mg by mouth daily.    Marland Kitchen. lisdexamfetamine (VYVANSE) 50 MG capsule Take 50 mg by mouth daily.    Marland Kitchen. topiramate (TOPAMAX) 50 MG tablet Take 50 mg by mouth 2 (two) times daily.    Marland Kitchen. amoxicillin (AMOXIL) 400 MG/5ML suspension Take 10.9 mLs (875 mg total) by mouth 2 (two) times daily. For 7 days (Patient not taking: Reported on 05/13/2016) 160 mL 0  . benzonatate (TESSALON) 100 MG capsule Take 1 capsule (100 mg total) by mouth every 8 (eight) hours. (Patient not taking: Reported on 05/13/2016) 21 capsule 0  . cloNIDine (CATAPRES) 0.2 MG tablet Take 0.2 mg by mouth 2 (two) times daily.    Marland Kitchen. HYDROcodone-homatropine (HYCODAN) 5-1.5 MG/5ML syrup Take 5 mLs by mouth every 6 (six) hours as needed for cough. (Patient not taking: Reported on 05/13/2016) 120 mL 0  . hyoscyamine (LEVSIN, ANASPAZ) 0.125 MG tablet Take 0.125 mg by mouth every 4 (four) hours as needed.    . ondansetron (ZOFRAN ODT) 4 MG disintegrating tablet Take one tab by  mouth Q6hr prn nausea (Dissolve under tongue) (Patient not taking: Reported on 05/13/2016) 12 tablet 0   No current facility-administered medications on file prior to visit.     PAST MEDICAL HISTORY: Past Medical History:  Diagnosis Date  . ADHD   . Anxiety   . Asthma   . Depression   . GERD (gastroesophageal reflux disease)   . IBS (irritable bowel syndrome)   . PCOS (polycystic ovarian syndrome)     PAST SURGICAL HISTORY: History  reviewed. No pertinent surgical history.  SOCIAL HISTORY: Social History  Substance Use Topics  . Smoking status: Never Smoker  . Smokeless tobacco: Never Used  . Alcohol use No    FAMILY HISTORY: Family History  Problem Relation Age of Onset  . Adopted: Yes    ROS: ROS  PHYSICAL EXAM: Blood pressure 119/82, pulse (!) 112, temperature 99.2 F (37.3 C), temperature source Oral, resp. rate 10, height 5\' 6"  (1.676 m), weight 217 lb 12.8 oz (98.8 kg), SpO2 98 %. Body mass index is 35.15 kg/m. Physical Exam  Constitutional: She is oriented to person, place, and time. She appears well-developed and well-nourished.  HENT:  Head: Normocephalic and atraumatic.  Nose: Nose normal.  Eyes: EOM are normal. No scleral icterus.  Neck: Normal range of motion. Neck supple. No thyromegaly present.  Cardiovascular: Normal rate and regular rhythm.   Pulmonary/Chest: Effort normal and breath sounds normal.  Abdominal: Soft. There is no tenderness.  Musculoskeletal: Normal range of motion. She exhibits edema.  Neurological: She is alert and oriented to person, place, and time. Coordination normal.  Skin: Skin is warm and dry.  Psychiatric: She has a normal mood and affect. Her behavior is normal.  Vitals reviewed.   RECENT LABS AND TESTS: BMET No results found for: NA, K, CL, CO2, GLUCOSE, BUN, CREATININE, CALCIUM, GFRNONAA, GFRAA No results found for: HGBA1C No results found for: INSULIN CBC No results found for: WBC, RBC, HGB, HCT, PLT, MCV, MCH, MCHC, RDW, LYMPHSABS, MONOABS, EOSABS, BASOSABS Iron/TIBC/Ferritin/ %Sat No results found for: IRON, TIBC, FERRITIN, IRONPCTSAT Lipid Panel  No results found for: CHOL, TRIG, HDL, CHOLHDL, VLDL, LDLCALC, LDLDIRECT Hepatic Function Panel  No results found for: PROT, ALBUMIN, AST, ALT, ALKPHOS, BILITOT, BILIDIR, IBILI No results found for: TSH  ECG done today shows NSR @ 101 BPM INDIRECT CALORIMETER done today shows a VO2 of 240 and a REE  of 1670.    ASSESSMENT AND PLAN: Other fatigue - Plan: EKG 12-Lead, Comprehensive metabolic panel, CBC With Differential, Lipid Panel With LDL/HDL Ratio, VITAMIN D 25 Hydroxy (Vit-D Deficiency, Fractures), Vitamin B12, Folate, T3, T4, free, TSH  Shortness of breath  PCOS (polycystic ovarian syndrome)  At risk for diabetes mellitus - Plan: Hemoglobin A1c, Insulin, random  Depression screening  Class 2 obesity without serious comorbidity with body mass index (BMI) of 35.0 to 35.9 in adult, unspecified obesity type  PLAN:  Fatigue Laura Perez was informed that her fatigue may be related to obesity, depression or many other causes. Labs will be ordered, and in the meanwhile Laura Perez has agreed to work on diet, exercise and weight loss to help with fatigue. Proper sleep hygiene was discussed including the need for 7-8 hours of quality sleep each night. A sleep study was not ordered based on symptoms and Epworth score.  Exercise Induced Shortness of Breath Laura Perez's shortness of breath appears to be obesity related and exercise induced. She has agreed to work on weight loss and gradually increase exercise to treat her exercise  induced shortness of breath. If Laura Perez follows our instructions and loses weight without improvement of her shortness of breath, we will plan to refer to pulmonology. We will monitor this condition regularly. Laura Perez agrees to this plan.  PCOS Will check labs and follow, will work on weight loss and treating insulin resistance once labs are back.  Tachycardia In light of worsening anxiety and tachycardia, I have asked her to hold her Wellbutrin for now and follow up in 2 weeks to reasses. We may consider another option for depression and anxiety.  Depression Screen Laura Perez had a positive depression screening. Depression is commonly associated with obesity and often results in emotional eating behaviors. We will monitor this closely and work on CBT to help improve the  non-hunger eating patterns. Referral to Psychology may be required if no improvement is seen as she continues in our clinic.  Obesity Laura Perez is currently in the action stage of change and her goal is to continue with weight loss efforts She has agreed to follow the Category 2 plan Laura Perez has been instructed to work up to a goal of 150 minutes of combined cardio and strengthening exercise per week for weight loss and overall health benefits. We discussed the following Behavioral Modification Stratagies today: increasing lean protein intake, increasing vegetables, work on meal planning and easy cooking plans, dealing with family or coworker sabotage and holiday eating strategies   Laura Perez has agreed to follow up with our clinic in 2 weeks. She was informed of the importance of frequent follow up visits to maximize her success with intensive lifestyle modifications for her multiple health conditions. She was informed we would discuss her lab results at her next visit unless there is a critical issue that needs to be addressed sooner. Laura Perez agreed to keep her next visit at the agreed upon time to discuss these results.  I, [place] CraneJoanne Murray, am acting as scribe for Quillian Quincearen Mirakle Tomlin, MD  I have reviewed the above documentation for accuracy and completeness, and I agree with the above. -Quillian Quincearen Raeshawn Vo, MD

## 2016-05-15 ENCOUNTER — Other Ambulatory Visit (INDEPENDENT_AMBULATORY_CARE_PROVIDER_SITE_OTHER): Payer: Self-pay

## 2016-05-15 DIAGNOSIS — R5383 Other fatigue: Secondary | ICD-10-CM | POA: Diagnosis not present

## 2016-05-15 DIAGNOSIS — Z9189 Other specified personal risk factors, not elsewhere classified: Secondary | ICD-10-CM | POA: Diagnosis not present

## 2016-05-16 LAB — COMPREHENSIVE METABOLIC PANEL
A/G RATIO: 1.7 (ref 1.2–2.2)
ALT: 29 IU/L (ref 0–32)
AST: 26 IU/L (ref 0–40)
Albumin: 4.4 g/dL (ref 3.5–5.5)
Alkaline Phosphatase: 106 IU/L (ref 39–117)
BUN/Creatinine Ratio: 18 (ref 9–23)
BUN: 16 mg/dL (ref 6–20)
Bilirubin Total: 0.4 mg/dL (ref 0.0–1.2)
CALCIUM: 9.4 mg/dL (ref 8.7–10.2)
CO2: 22 mmol/L (ref 18–29)
CREATININE: 0.87 mg/dL (ref 0.57–1.00)
Chloride: 100 mmol/L (ref 96–106)
GFR, EST AFRICAN AMERICAN: 109 mL/min/{1.73_m2} (ref >59)
GFR, EST NON AFRICAN AMERICAN: 94 mL/min/{1.73_m2} (ref >59)
GLOBULIN, TOTAL: 2.6 g/dL (ref 1.5–4.5)
Glucose: 98 mg/dL (ref 65–99)
Potassium: 4.7 mmol/L (ref 3.5–5.2)
SODIUM: 142 mmol/L (ref 134–144)
TOTAL PROTEIN: 7 g/dL (ref 6.0–8.5)

## 2016-05-16 LAB — LIPID PANEL WITH LDL/HDL RATIO
CHOLESTEROL TOTAL: 125 mg/dL (ref 100–199)
HDL: 38 mg/dL — AB (ref >39)
LDL CALC: 60 mg/dL (ref 0–99)
LDl/HDL Ratio: 1.6 ratio units (ref 0.0–3.2)
TRIGLYCERIDES: 133 mg/dL (ref 0–149)
VLDL CHOLESTEROL CAL: 27 mg/dL (ref 5–40)

## 2016-05-16 LAB — CBC WITH DIFFERENTIAL
BASOS: 1 %
Basophils Absolute: 0.1 10*3/uL (ref 0.0–0.2)
EOS (ABSOLUTE): 0.1 10*3/uL (ref 0.0–0.4)
Eos: 2 %
HEMATOCRIT: 38.4 % (ref 34.0–46.6)
HEMOGLOBIN: 12.5 g/dL (ref 11.1–15.9)
IMMATURE GRANS (ABS): 0 10*3/uL (ref 0.0–0.1)
Immature Granulocytes: 0 %
LYMPHS ABS: 2.9 10*3/uL (ref 0.7–3.1)
LYMPHS: 32 %
MCH: 27.7 pg (ref 26.6–33.0)
MCHC: 32.6 g/dL (ref 31.5–35.7)
MCV: 85 fL (ref 79–97)
MONOCYTES: 7 %
Monocytes Absolute: 0.6 10*3/uL (ref 0.1–0.9)
NEUTROS ABS: 5.2 10*3/uL (ref 1.4–7.0)
Neutrophils: 58 %
RBC: 4.52 x10E6/uL (ref 3.77–5.28)
RDW: 14.1 % (ref 12.3–15.4)
WBC: 8.9 10*3/uL (ref 3.4–10.8)

## 2016-05-16 LAB — T3: T3, Total: 178 ng/dL (ref 71–180)

## 2016-05-16 LAB — VITAMIN D 25 HYDROXY (VIT D DEFICIENCY, FRACTURES): Vit D, 25-Hydroxy: 21.1 ng/mL — ABNORMAL LOW (ref 30.0–100.0)

## 2016-05-16 LAB — FOLATE: FOLATE: 16.4 ng/mL (ref >3.0)

## 2016-05-16 LAB — VITAMIN B12: Vitamin B-12: 433 pg/mL (ref 232–1245)

## 2016-05-16 LAB — TSH: TSH: 1.33 u[IU]/mL (ref 0.450–4.500)

## 2016-05-16 LAB — HEMOGLOBIN A1C
Est. average glucose Bld gHb Est-mCnc: 108 mg/dL
Hgb A1c MFr Bld: 5.4 % (ref 4.8–5.6)

## 2016-05-16 LAB — T4, FREE: Free T4: 1.27 ng/dL (ref 0.82–1.77)

## 2016-05-16 LAB — INSULIN, RANDOM: INSULIN: 30.2 u[IU]/mL — ABNORMAL HIGH (ref 2.6–24.9)

## 2016-05-21 ENCOUNTER — Encounter: Payer: Self-pay | Admitting: Podiatry

## 2016-05-21 ENCOUNTER — Ambulatory Visit (INDEPENDENT_AMBULATORY_CARE_PROVIDER_SITE_OTHER): Payer: 59 | Admitting: Podiatry

## 2016-05-21 ENCOUNTER — Ambulatory Visit (INDEPENDENT_AMBULATORY_CARE_PROVIDER_SITE_OTHER): Payer: 59

## 2016-05-21 VITALS — BP 118/78 | HR 93 | Resp 18

## 2016-05-21 DIAGNOSIS — M722 Plantar fascial fibromatosis: Secondary | ICD-10-CM

## 2016-05-21 MED ORDER — DICLOFENAC SODIUM 75 MG PO TBEC: 75.0000 mg | DELAYED_RELEASE_TABLET | Freq: Two times a day (BID) | ORAL | 1 refills | Status: AC

## 2016-05-21 MED FILL — DICLOFENAC SOD 75 MG TAB EC: 75 | 30 days supply | Qty: 60 | Fill #0

## 2016-05-21 NOTE — Progress Notes (Signed)
   Subjective:    Patient ID: Laura CrowNichole Perez, female    DOB: 05/19/1991, 25 y.o.   MRN: [id number]  HPI    This patient presents today with her mother present in the treatment room. She is complaining of bilateral inferior plantar pain in the heel arch areas with the left foot more symptomatic than the right. She describes pain upon initial standing and walking reducing somewhat with activity in gradually increasing with prolonged standing and walking. 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 She has been doing this job for approximately 3 months. She describes taking occasional Tylenol and ibuprofen for pain relief. She also has varied her shoes and finds that a more rigid shoe is more comfortable than at athletic style shoe. She denies any direct injury to or feet  Review of Systems  Musculoskeletal: Positive for gait problem.  All other systems reviewed and are negative.      Objective:   Physical Exam  Patient states she is approximated 5 foot 6 inches and weighs 217 pounds. She says that she's attempting to reduce weight  Orientated 3  Vascular: No calf edema or calf tenderness bilaterally DP and PT pulses 2/4 bilaterally Capillary reflex immediate bilaterally  Neurological: Sensation to 10 g monofilament wire intact 5/bilaterally Vibratory sensation reactive bilaterally Ankle reflex equal and reactive bilaterally  Dermatological: Texture and turgor within normal limits bilaterally No open skin lesions bilaterally  Musculoskeletal: Pes planus bilaterally Ankle motion 90 with knee flexed and extended Exquisite palpable tenderness and inferior heels and proximal one third of fascial bands bilaterally without any palpable lesions.  HAV bilaterally Varus rotated fifth toes bilaterally  X-ray examination weightbearing left foot dated 05/21/2016  Intact bony structure  without fracture and/or dislocation HAV Varus rotated fifth toe Small posterior calcaneal spur  Radiographic impression: No acute bony abnormality noted in weightbearing x-ray of the left foot dated 05/21/2016  X-ray weightbearing right foot dated 05/21/2016  Intact bony structure without fracture and/or dislocation Pes planus HAV Small posterior calcaneal spur Varus rotated fifth right toe  Radiographic impression: No acute bony abnormality noted weightbearing x-ray of the right foot dated 05/21/2016     Assessment & Plan:   Assessment: Satisfactory neurovascular status Plantar fasciitis bilaterally  Plan: I reviewed the results of the exam an x-ray with patient and patient's mother in detail. We discussed fasciitis and discuss treatment options including shoeing, supportive therapy, NSAID therapy We discussed stretching and weight reduction  Fasciitis strap's 2 dispensed with wearing instructions provided to wear an existing shoes that she finds most comfortable Rx diclofenac sodium 75 mg dispense 60 sig one by mouth twice a day 1 refill Instructed patient to DC over-the-counter ibuprofen Patient return if symptoms are not gradually improving over the next 4-6 weeks

## 2016-05-21 NOTE — Patient Instructions (Signed)
Stretch 1 When changing from a seated to standing position toes toward your nose 30-60 seconds Stretch to With shoes on and knee straight clean against the wall 30-60 seconds repeat as needed  Plantar Fasciitis Plantar fasciitis is a painful foot condition that affects the heel. It occurs when the band of tissue that connects the toes to the heel bone (plantar fascia) becomes irritated. This can happen after exercising too much or doing other repetitive activities (overuse injury). The pain from plantar fasciitis can range from mild irritation to severe pain that makes it difficult for you to walk or move. The pain is usually worse in the morning or after you have been sitting or lying down for a while. CAUSES This condition may be caused by:  Standing for long periods of time.  Wearing shoes that do not fit.  Doing high-impact activities, including running, aerobics, and ballet.  Being overweight.  Having an abnormal way of walking (gait).  Having tight calf muscles.  Having high arches in your feet.  Starting a new athletic activity. SYMPTOMS The main symptom of this condition is heel pain. Other symptoms include:  Pain that gets worse after activity or exercise.  Pain that is worse in the morning or after resting.  Pain that goes away after you walk for a few minutes. DIAGNOSIS This condition may be diagnosed based on your signs and symptoms. Your health care provider will also do a physical exam to check for:  A tender area on the bottom of your foot.  A high arch in your foot.  Pain when you move your foot.  Difficulty moving your foot. You may also need to have imaging studies to confirm the diagnosis. These can include:  X-rays.  Ultrasound.  MRI. TREATMENT  Treatment for plantar fasciitis depends on the severity of the condition. Your treatment may include:  Rest, ice, and over-the-counter pain medicines to manage your pain.  Exercises to stretch your  calves and your plantar fascia.  A splint that holds your foot in a stretched, upward position while you sleep (night splint).  Physical therapy to relieve symptoms and prevent problems in the future.  Cortisone injections to relieve severe pain.  Extracorporeal shock wave therapy (ESWT) to stimulate damaged plantar fascia with electrical impulses. It is often used as a last resort before surgery.  Surgery, if other treatments have not worked after 12 months. HOME CARE INSTRUCTIONS  Take medicines only as directed by your health care provider.  Avoid activities that cause pain.  Roll the bottom of your foot over a bag of ice or a bottle of cold water. Do this for 20 minutes, 3-4 times a day.  Perform simple stretches as directed by your health care provider.  Try wearing athletic shoes with air-sole or gel-sole cushions or soft shoe inserts.  Wear a night splint while sleeping, if directed by your health care provider.  Keep all follow-up appointments with your health care provider. PREVENTION   Do not perform exercises or activities that cause heel pain.  Consider finding low-impact activities if you continue to have problems.  Lose weight if you need to. The best way to prevent plantar fasciitis is to avoid the activities that aggravate your plantar fascia. SEEK MEDICAL CARE IF:  Your symptoms do not go away after treatment with home care measures.  Your pain gets worse.  Your pain affects your ability to move or do your daily activities. This information is not intended to replace advice given  to you by your health care provider. Make sure you discuss any questions you have with your health care provider. Document Released: 02/04/2001 Document Revised: 09/03/2015 Document Reviewed: 03/22/2014 [employer] Interactive Patient Education  2017 [employer]

## 2016-05-29 ENCOUNTER — Ambulatory Visit (INDEPENDENT_AMBULATORY_CARE_PROVIDER_SITE_OTHER): Payer: 59 | Admitting: Family Medicine

## 2016-05-29 VITALS — BP 103/70 | Ht 66.0 in | Wt 218.0 lb

## 2016-05-29 DIAGNOSIS — E559 Vitamin D deficiency, unspecified: Secondary | ICD-10-CM | POA: Diagnosis not present

## 2016-05-29 DIAGNOSIS — Z6835 Body mass index (BMI) 35.0-35.9, adult: Secondary | ICD-10-CM

## 2016-05-29 DIAGNOSIS — K219 Gastro-esophageal reflux disease without esophagitis: Secondary | ICD-10-CM | POA: Diagnosis not present

## 2016-05-29 DIAGNOSIS — Z9189 Other specified personal risk factors, not elsewhere classified: Secondary | ICD-10-CM

## 2016-05-29 DIAGNOSIS — F329 Major depressive disorder, single episode, unspecified: Secondary | ICD-10-CM | POA: Diagnosis not present

## 2016-05-29 DIAGNOSIS — E669 Obesity, unspecified: Secondary | ICD-10-CM | POA: Diagnosis not present

## 2016-05-29 DIAGNOSIS — F32A Depression, unspecified: Secondary | ICD-10-CM

## 2016-05-29 DIAGNOSIS — E8881 Metabolic syndrome: Secondary | ICD-10-CM | POA: Diagnosis not present

## 2016-05-29 MED ORDER — VITAMIN D (ERGOCALCIFEROL) 1.25 MG (50000 UNIT) PO CAPS: 50000.0000 [IU] | ORAL_CAPSULE | ORAL | 0 refills | Status: DC

## 2016-05-29 MED FILL — VIT D2 1.25 MG (50,000 UNIT: 1.25 MG | 28 days supply | Qty: 4 | Fill #0

## 2016-05-29 NOTE — Progress Notes (Signed)
Office: [phone]  /  Fax: [phone]   HPI:   Chief Complaint: OBESITY Laura Perez is here to discuss her progress with her obesity treatment plan. She states she is following her eating plan approximately 80 % of the time and states she is exercising 0 minutes 0 times per week. Laura Perez is currently struggling with sticking with her eating plan.  Her weight is 218 lb (98.9 kg) today and has not lost weight since her last visit. She has lost 0 lbs since starting treatment with us. Patient deviated from the plan, still drinking liquids with calories. She did not like having to eat breakfast, got bored with the plan, didn't want to eat all her protein and noted cravings were a problem.    GERD Worse after eating fatty foods and laying down, this is a long standing problem but has been worse in the last two weeks. She sometimes vomits after eating and laying down.   Vitamin D deficiency Laura Perez has a new diagnosis of vitamin D deficiency. She is positive for fatigue. She is not currently taking vit D and denies nausea, vomiting or muscle weakness.  Depression with emotional eating behaviors Laura Perez is struggling with emotional eating and using food for comfort to the extent that it is negatively impacting her health. Laura Perez shows no signs of suicidal or homicidal ideations. Laura Perez feels mood is worse in the last month, she sees a psychiatrist and has an appointment tomorrow. She does note an increase in stress and some flashes of intense anger.    Insulin Resistance Laura Perez has a diagnosis of insulin resistance based on her elevated fasting insulin level >5. Although Laura Perez blood glucose readings are still under good control, insulin resistance puts her at greater risk of metabolic syndrome and diabetes. She is not taking metformin currently and continues to work on diet and exercise to decrease risk of diabetes.  At risk for diabetes Laura Perez is at higher than averagerisk for developing  diabetes due to her obesity. She currently denies polyuria or polydipsia.  Wt Readings from Last 500 Encounters:  05/29/16 218 lb (98.9 kg)  05/13/16 217 lb 12.8 oz (98.8 kg)  04/09/16 218 lb (98.9 kg)  05/08/15 219 lb (99.3 kg)  12/11/14 211 lb (95.7 kg)  11/01/14 198 lb (89.8 kg)  10/25/14 198 lb (89.8 kg)     ALLERGIES: No Known Allergies  MEDICATIONS: Current Outpatient Prescriptions on File Prior to Visit  Medication Sig Dispense Refill  . beclomethasone (QVAR) 80 MCG/ACT inhaler Inhale 1 puff into the lungs as needed.    . diclofenac (VOLTAREN) 75 MG EC tablet Take 1 tablet (75 mg total) by mouth 2 (two) times daily. 60 tablet 1  . escitalopram (LEXAPRO) 10 MG tablet Take 10 mg by mouth daily.    Marland Kitchen. lisdexamfetamine (VYVANSE) 50 MG capsule Take 50 mg by mouth daily.    . ondansetron (ZOFRAN ODT) 4 MG disintegrating tablet Take one tab by mouth Q6hr prn nausea (Dissolve under tongue) 12 tablet 0   No current facility-administered medications on file prior to visit.     PAST MEDICAL HISTORY: Past Medical History:  Diagnosis Date  . ADHD   . Anxiety   . Asthma   . Depression   . GERD (gastroesophageal reflux disease)   . IBS (irritable bowel syndrome)   . PCOS (polycystic ovarian syndrome)     PAST SURGICAL HISTORY: No past surgical history on file.  SOCIAL HISTORY: Social History  Substance Use Topics  . Smoking  status: Never Smoker  . Smokeless tobacco: Never Used  . Alcohol use No    FAMILY HISTORY: Family History  Problem Relation Age of Onset  . Adopted: Yes    ROS: Review of Systems  Constitutional: Positive for malaise/fatigue.  Endo/Heme/Allergies:       Polyphagia    PHYSICAL EXAM: Blood pressure 103/70, height 5\' 6"  (1.676 m), weight 218 lb (98.9 kg). Body mass index is 35.19 kg/m. Physical Exam  Constitutional: She is oriented to person, place, and time. She appears well-developed and well-nourished.  HENT:  Head: Normocephalic and  atraumatic.  Eyes: EOM are normal. Pupils are equal, round, and reactive to light.  Neck: Normal range of motion. Neck supple.  Cardiovascular: Normal rate and regular rhythm.   Pulmonary/Chest: Effort normal and breath sounds normal.  Abdominal: Soft. Bowel sounds are normal.  Musculoskeletal: Normal range of motion.  Neurological: She is alert and oriented to person, place, and time.  Skin: Skin is warm and dry.  Psychiatric: She has a normal mood and affect. Her behavior is normal.    RECENT LABS AND TESTS: BMET    Component Value Date/Time   NA 142 05/15/2016 1156   K 4.7 05/15/2016 1156   CL 100 05/15/2016 1156   CO2 22 05/15/2016 1156   GLUCOSE 98 05/15/2016 1156   BUN 16 05/15/2016 1156   CREATININE 0.87 05/15/2016 1156   CALCIUM 9.4 05/15/2016 1156   GFRNONAA 94 05/15/2016 1156   GFRAA 109 05/15/2016 1156   Lab Results  Component Value Date   HGBA1C 5.4 05/15/2016   Lab Results  Component Value Date   INSULIN 30.2 (H) 05/15/2016   CBC    Component Value Date/Time   WBC 8.9 05/15/2016 1156   RBC 4.52 05/15/2016 1156   HCT 38.4 05/15/2016 1156   MCV 85 05/15/2016 1156   MCH 27.7 05/15/2016 1156   MCHC 32.6 05/15/2016 1156   RDW 14.1 05/15/2016 1156   LYMPHSABS 2.9 05/15/2016 1156   EOSABS 0.1 05/15/2016 1156   BASOSABS 0.1 05/15/2016 1156   Iron/TIBC/Ferritin/ %Sat No results found for: IRON, TIBC, FERRITIN, IRONPCTSAT Lipid Panel     Component Value Date/Time   CHOL 125 05/15/2016 1156   TRIG 133 05/15/2016 1156   HDL 38 (L) 05/15/2016 1156   LDLCALC 60 05/15/2016 1156   Hepatic Function Panel     Component Value Date/Time   PROT 7.0 05/15/2016 1156   ALBUMIN 4.4 05/15/2016 1156   AST 26 05/15/2016 1156   ALT 29 05/15/2016 1156   ALKPHOS 106 05/15/2016 1156   BILITOT 0.4 05/15/2016 1156      Component Value Date/Time   TSH 1.330 05/15/2016 1156    ASSESSMENT AND PLAN: Vitamin D deficiency - Plan: Vitamin D, Ergocalciferol, (DRISDOL)  50000 units CAPS capsule  PLAN:  GERD Will refer to GI and follow up as her symptoms do not appear to be due to GERD only  Vitamin D Deficiency Laura Perez was informed that low vitamin D levels contributes to fatigue and are associated with obesity, breast, and colon cancer. She agrees to continue to take prescription Vit D @50 ,000 IU every week and will follow up for routine testing of vitamin D, at least 2-3 times per year. She was informed of the risk of over-replacement of vitamin D and agrees to not increase her dose unless he discusses this with [place] first. Will start her on Vitamin D 50,000 IU every week #4 with 0 refills.   Depression with  emotional eating disorders  We discussed behavior modification technique today to help Laura Perez deal with her emotional eating and depression.   Insulin Resistance Mckay will continue to work on weight loss, exercise, and decreasing simple carbohydrates in her diet to help decrease the risk of diabetes. We dicussed metformin including benefits and risks. She was informed that eating too many simple carbohydrates or too many calories at one sitting increases the likelihood of GI side effects. Bonnita declined metformin for now and prescription was not written today. Mallery agreed to follow up with [place] as directed to monitor her progress.  Diabetes risk counselling Abigial was given extended (at least 15 minutes) diabetes prevention counseling today. She is 26 y.o. female and has risk factors for diabetes including obesity. We discussed intensive lifestyle modifications today with an emphasis on weight loss as well as increasing exercise and decreasing simple carbohydrates in her diet.  Obesity Corinna states she is now in the action stage of change. As such, her goal is to get back to weightloss efforts  She has agreed to keep a food journal with 300-450 calories and 25 protein daily and follow the Category 2 plan Mennie has been instructed to work up to  a goal of 150 minutes of combined cardio and strengthening exercise per week for weight loss and overall health benefits. We discussed the following Behavioral Modification Stratagies today: increasing lean protein intake, decreasing simple carbohydrates , emotional eating strategies and avoiding temptations   Kajal has agreed to follow up with our clinic in 2 weeks. She was informed of the importance of frequent follow up visits to maximize her success with intensive lifestyle modifications for her multiple health conditions.  I, April Moore, am acting as Neurosurgeon for Quillian Quince, MD  I have reviewed the above documentation for accuracy and completeness, and I agree with the above. -Quillian Quince, MD

## 2016-05-30 DIAGNOSIS — F901 Attention-deficit hyperactivity disorder, predominantly hyperactive type: Secondary | ICD-10-CM | POA: Diagnosis not present

## 2016-06-11 ENCOUNTER — Ambulatory Visit (INDEPENDENT_AMBULATORY_CARE_PROVIDER_SITE_OTHER): Payer: 59 | Admitting: Family Medicine

## 2016-06-13 DIAGNOSIS — R52 Pain, unspecified: Secondary | ICD-10-CM

## 2016-06-17 DIAGNOSIS — F901 Attention-deficit hyperactivity disorder, predominantly hyperactive type: Secondary | ICD-10-CM | POA: Diagnosis not present

## 2016-06-24 ENCOUNTER — Ambulatory Visit (INDEPENDENT_AMBULATORY_CARE_PROVIDER_SITE_OTHER): Payer: 59 | Admitting: Family Medicine

## 2016-06-24 VITALS — BP 117/74 | HR 74 | Temp 98.2°F | Ht 66.0 in | Wt 218.0 lb

## 2016-06-24 DIAGNOSIS — F419 Anxiety disorder, unspecified: Secondary | ICD-10-CM | POA: Diagnosis not present

## 2016-06-24 DIAGNOSIS — Z6835 Body mass index (BMI) 35.0-35.9, adult: Secondary | ICD-10-CM | POA: Diagnosis not present

## 2016-06-24 DIAGNOSIS — F3289 Other specified depressive episodes: Secondary | ICD-10-CM

## 2016-06-24 DIAGNOSIS — E559 Vitamin D deficiency, unspecified: Secondary | ICD-10-CM | POA: Diagnosis not present

## 2016-06-24 DIAGNOSIS — E669 Obesity, unspecified: Secondary | ICD-10-CM | POA: Diagnosis not present

## 2016-06-24 MED ORDER — VITAMIN D (ERGOCALCIFEROL) 1.25 MG (50000 UNIT) PO CAPS: 50000.0000 [IU] | ORAL_CAPSULE | ORAL | 0 refills | Status: DC

## 2016-06-24 MED FILL — VIT D2 1.25 MG (50,000 UNIT: 1.25 MG | 28 days supply | Qty: 4 | Fill #0

## 2016-06-25 NOTE — Progress Notes (Signed)
Office: [phone]  /  Fax: [phone]   HPI:   Chief Complaint: OBESITY Laura Perez is here to discuss her progress with her obesity treatment plan. She is following her eating plan approximately 10 % of the time and states she is exercising 0 minutes 0 times per week. Laura Perez is currently still struggling with category 2 plan, she doesn't like the structure of the plan, she doesn't like to cook and is looking for other options.  Her weight is 218 lb (98.9 kg) today and she has maintained her weight since her last visit. She has lost 0 lbs since starting treatment with [place].  Vitamin D deficiency Laura Perez has a diagnosis of vitamin D deficiency. She is currently taking vit D, she notes improved energy and denies nausea, vomiting or muscle weakness.  Anxiety/Depression Laura Perez had an exacerbation of anxiety, she notes increased stress eating, medications were changed to Trintellix, she has questions about how to avoid emotional eating.  Wt Readings from Last 500 Encounters:  06/24/16 218 lb (98.9 kg)  05/29/16 218 lb (98.9 kg)  05/13/16 217 lb 12.8 oz (98.8 kg)  04/09/16 218 lb (98.9 kg)  05/08/15 219 lb (99.3 kg)  12/11/14 211 lb (95.7 kg)  11/01/14 198 lb (89.8 kg)  10/25/14 198 lb (89.8 kg)     ALLERGIES: No Known Allergies  MEDICATIONS: Current Outpatient Prescriptions on File Prior to Visit  Medication Sig Dispense Refill  . beclomethasone (QVAR) 80 MCG/ACT inhaler Inhale 1 puff into the lungs as needed.    . diclofenac (VOLTAREN) 75 MG EC tablet Take 1 tablet (75 mg total) by mouth 2 (two) times daily. 60 tablet 1  . escitalopram (LEXAPRO) 10 MG tablet Take 10 mg by mouth daily.    Marland Kitchen lisdexamfetamine (VYVANSE) 50 MG capsule Take 50 mg by mouth daily.    . ondansetron (ZOFRAN ODT) 4 MG disintegrating tablet Take one tab by mouth Q6hr prn nausea (Dissolve under tongue) 12 tablet 0   No current facility-administered medications on file prior to visit.     PAST MEDICAL  HISTORY: Past Medical History:  Diagnosis Date  . ADHD   . Anxiety   . Asthma   . Depression   . GERD (gastroesophageal reflux disease)   . IBS (irritable bowel syndrome)   . PCOS (polycystic ovarian syndrome)     PAST SURGICAL HISTORY: No past surgical history on file.  SOCIAL HISTORY: Social History  Substance Use Topics  . Smoking status: Never Smoker  . Smokeless tobacco: Never Used  . Alcohol use No    FAMILY HISTORY: Family History  Problem Relation Age of Onset  . Adopted: Yes    ROS: Review of Systems  Constitutional: Negative for weight loss.  Gastrointestinal: Negative for nausea and vomiting.  Musculoskeletal:       Negative muscle weakness  Psychiatric/Behavioral: Positive for depression. The patient is nervous/anxious.     PHYSICAL EXAM: Blood pressure 117/74, pulse 74, temperature 98.2 F (36.8 C), temperature source Oral, height 5\' 6"  (1.676 m), weight 218 lb (98.9 kg), SpO2 99 %. Body mass index is 35.19 kg/m. Physical Exam  Constitutional: She is oriented to person, place, and time. She appears well-nourished.  Cardiovascular: Normal rate.   Pulmonary/Chest: Effort normal.  Musculoskeletal: Normal range of motion. She exhibits edema (trace edema bilateral lower extremeties).  Neurological: She is oriented to person, place, and time.  Skin: Skin is warm and dry.  Vitals reviewed.   RECENT LABS AND TESTS: BMET  Component Value Date/Time   NA 142 05/15/2016 1156   K 4.7 05/15/2016 1156   CL 100 05/15/2016 1156   CO2 22 05/15/2016 1156   GLUCOSE 98 05/15/2016 1156   BUN 16 05/15/2016 1156   CREATININE 0.87 05/15/2016 1156   CALCIUM 9.4 05/15/2016 1156   GFRNONAA 94 05/15/2016 1156   GFRAA 109 05/15/2016 1156   Lab Results  Component Value Date   HGBA1C 5.4 05/15/2016   Lab Results  Component Value Date   INSULIN 30.2 (H) 05/15/2016   CBC    Component Value Date/Time   WBC 8.9 05/15/2016 1156   RBC 4.52 05/15/2016 1156    HCT 38.4 05/15/2016 1156   MCV 85 05/15/2016 1156   MCH 27.7 05/15/2016 1156   MCHC 32.6 05/15/2016 1156   RDW 14.1 05/15/2016 1156   LYMPHSABS 2.9 05/15/2016 1156   EOSABS 0.1 05/15/2016 1156   BASOSABS 0.1 05/15/2016 1156   Iron/TIBC/Ferritin/ %Sat No results found for: IRON, TIBC, FERRITIN, IRONPCTSAT Lipid Panel     Component Value Date/Time   CHOL 125 05/15/2016 1156   TRIG 133 05/15/2016 1156   HDL 38 (L) 05/15/2016 1156   LDLCALC 60 05/15/2016 1156   Hepatic Function Panel     Component Value Date/Time   PROT 7.0 05/15/2016 1156   ALBUMIN 4.4 05/15/2016 1156   AST 26 05/15/2016 1156   ALT 29 05/15/2016 1156   ALKPHOS 106 05/15/2016 1156   BILITOT 0.4 05/15/2016 1156      Component Value Date/Time   TSH 1.330 05/15/2016 1156    ASSESSMENT AND PLAN: Vitamin D deficiency - Plan: Vitamin D, Ergocalciferol, (DRISDOL) 50000 units CAPS capsule  Anxiety  Other depression  Class 2 obesity without serious comorbidity with body mass index (BMI) of 35.0 to 35.9 in adult, unspecified obesity type  PLAN:  Vitamin D Deficiency Laura Perez was informed that low vitamin D levels contributes to fatigue and are associated with obesity, breast, and colon cancer. She agrees to continue to take prescription Vit D @50 ,000 IU every week. Refilled for 1 month and will follow up for routine testing of vitamin D, at least 2-3 times per year. She was informed of the risk of over-replacement of vitamin D and agrees to not increase her dose unless he discusses this with Laura Perez first.  Anxiety/Depression We discussed cognitive Behavior Therapy today to help re-direct her emotional eating.  Obesity Laura Perez is currently in the action stage of change. As such, her goal is to continue with weight loss efforts She has agreed to change to keeping a food journal with 1100-1400 calories and 75 grams of protein daily Laura Perez has been instructed to work up to a goal of 150 minutes of combined cardio and  strengthening exercise per week for weight loss and overall health benefits. We discussed the following Behavioral Modification Stratagies today: increasing lean protein intake, work on meal planning and easy cooking plans, ways to avoid boredom eating and ways to avoid night time snacking  Kimberlyann has agreed to follow up with our clinic in 2 weeks. She was informed of the importance of frequent follow up visits to maximize her success with intensive lifestyle modifications for her multiple health conditions.  I, [place] CraneJoanne Murray, am acting as scribe for Quillian Quincearen Allia Wiltsey, MD  I have reviewed the above documentation for accuracy and completeness, and I agree with the above. -Quillian Quincearen Tavon Corriher, MD

## 2016-07-09 ENCOUNTER — Ambulatory Visit (INDEPENDENT_AMBULATORY_CARE_PROVIDER_SITE_OTHER): Payer: 59 | Admitting: Family Medicine

## 2016-07-09 VITALS — BP 102/67 | HR 80 | Temp 97.4°F | Ht 66.0 in | Wt 218.0 lb

## 2016-07-09 DIAGNOSIS — F3289 Other specified depressive episodes: Secondary | ICD-10-CM

## 2016-07-09 DIAGNOSIS — E559 Vitamin D deficiency, unspecified: Secondary | ICD-10-CM

## 2016-07-09 DIAGNOSIS — E669 Obesity, unspecified: Secondary | ICD-10-CM

## 2016-07-09 DIAGNOSIS — Z6835 Body mass index (BMI) 35.0-35.9, adult: Secondary | ICD-10-CM | POA: Diagnosis not present

## 2016-07-09 MED ORDER — VITAMIN D (ERGOCALCIFEROL) 1.25 MG (50000 UNIT) PO CAPS: 50000.0000 [IU] | ORAL_CAPSULE | ORAL | 0 refills | Status: DC

## 2016-07-09 NOTE — Progress Notes (Signed)
5.6  

## 2016-07-09 NOTE — Progress Notes (Signed)
Office: [phone]  /  Fax: [phone]   HPI:   Chief Complaint: OBESITY Laura Perez is here to discuss her progress with her obesity treatment plan. She is following her eating plan approximately 75 % of the time and states she is exercising 0 minutes 0 times per week. Laura Perez continues to do well with weight maintenance. She hasn't been able to follow category 2 plan well and she isn't journaling. She's mostly doing portion control/smart choices, which is a good maintenance strategy but rarely causes weight loss.  Her weight is 218 lb (98.9 kg) today and has maintained weight since her last visit. She has gained 1 lb since starting treatment with Laura Perez.  Depression Laura Perez mood is better. She is doing cognitive behavioral therapy and working on stress relief techniques. She notes decreased emotional eating.  Vitamin D deficiency Laura Perez has a diagnosis of vitamin D deficiency. She is currently taking vit D and denies nausea, vomiting or muscle weakness.  Wt Readings from Last 500 Encounters:  07/09/16 218 lb (98.9 kg)  06/24/16 218 lb (98.9 kg)  05/29/16 218 lb (98.9 kg)  05/13/16 217 lb 12.8 oz (98.8 kg)  04/09/16 218 lb (98.9 kg)  05/08/15 219 lb (99.3 kg)  12/11/14 211 lb (95.7 kg)  11/01/14 198 lb (89.8 kg)  10/25/14 198 lb (89.8 kg)     ALLERGIES: No Known Allergies  MEDICATIONS: Current Outpatient Prescriptions on File Prior to Visit  Medication Sig Dispense Refill   beclomethasone (QVAR) 80 MCG/ACT inhaler Inhale 1 puff into the lungs as needed.     diclofenac (VOLTAREN) 75 MG EC tablet Take 1 tablet (75 mg total) by mouth 2 (two) times daily. 60 tablet 1   escitalopram (LEXAPRO) 10 MG tablet Take 10 mg by mouth daily.     lisdexamfetamine (VYVANSE) 50 MG capsule Take 50 mg by mouth daily.     ondansetron (ZOFRAN ODT) 4 MG disintegrating tablet Take one tab by mouth Q6hr prn nausea (Dissolve under tongue) 12 tablet 0   vortioxetine HBr (TRINTELLIX) 5 MG TABS  Take 1 tablet by mouth every morning.     No current facility-administered medications on file prior to visit.     PAST MEDICAL HISTORY: Past Medical History:  Diagnosis Date   ADHD    Anxiety    Asthma    Depression    GERD (gastroesophageal reflux disease)    IBS (irritable bowel syndrome)    PCOS (polycystic ovarian syndrome)     PAST SURGICAL HISTORY: No past surgical history on file.  SOCIAL HISTORY: Social History  Substance Use Topics   Smoking status: Never Smoker   Smokeless tobacco: Never Used   Alcohol use No    FAMILY HISTORY: Family History  Problem Relation Age of Onset   Adopted: Yes    ROS: Review of Systems  Constitutional: Positive for malaise/fatigue.  Gastrointestinal: Negative for nausea and vomiting.  Musculoskeletal:       Negative Muscle Weakness  Psychiatric/Behavioral: Positive for depression.    PHYSICAL EXAM: Blood pressure 102/67, pulse 80, temperature 97.4 F (36.3 C), temperature source Oral, height 5\' 6"  (1.676 m), weight 218 lb (98.9 kg), SpO2 98 %. Body mass index is 35.19 kg/m. Physical Exam  Constitutional: She is oriented to person, place, and time. She appears well-developed and well-nourished.  Cardiovascular: Normal rate.   Pulmonary/Chest: Effort normal.  Musculoskeletal: Normal range of motion.  Neurological: She is oriented to person, place, and time.  Skin: Skin is warm and dry.  Psychiatric: She has a normal mood and affect. Her behavior is normal.  Vitals reviewed.   RECENT LABS AND TESTS: BMET    Component Value Date/Time   NA 142 05/15/2016 1156   K 4.7 05/15/2016 1156   CL 100 05/15/2016 1156   CO2 22 05/15/2016 1156   GLUCOSE 98 05/15/2016 1156   BUN 16 05/15/2016 1156   CREATININE 0.87 05/15/2016 1156   CALCIUM 9.4 05/15/2016 1156   GFRNONAA 94 05/15/2016 1156   GFRAA 109 05/15/2016 1156   Lab Results  Component Value Date   HGBA1C 5.4 05/15/2016   Lab Results  Component Value  Date   INSULIN 30.2 (H) 05/15/2016   CBC    Component Value Date/Time   WBC 8.9 05/15/2016 1156   RBC 4.52 05/15/2016 1156   HCT 38.4 05/15/2016 1156   MCV 85 05/15/2016 1156   MCH 27.7 05/15/2016 1156   MCHC 32.6 05/15/2016 1156   RDW 14.1 05/15/2016 1156   LYMPHSABS 2.9 05/15/2016 1156   EOSABS 0.1 05/15/2016 1156   BASOSABS 0.1 05/15/2016 1156   Iron/TIBC/Ferritin/ %Sat No results found for: IRON, TIBC, FERRITIN, IRONPCTSAT Lipid Panel     Component Value Date/Time   CHOL 125 05/15/2016 1156   TRIG 133 05/15/2016 1156   HDL 38 (L) 05/15/2016 1156   LDLCALC 60 05/15/2016 1156   Hepatic Function Panel     Component Value Date/Time   PROT 7.0 05/15/2016 1156   ALBUMIN 4.4 05/15/2016 1156   AST 26 05/15/2016 1156   ALT 29 05/15/2016 1156   ALKPHOS 106 05/15/2016 1156   BILITOT 0.4 05/15/2016 1156      Component Value Date/Time   TSH 1.330 05/15/2016 1156    ASSESSMENT AND PLAN: Vitamin D deficiency - Plan: Vitamin D, Ergocalciferol, (DRISDOL) 50000 units CAPS capsule  Other depression  Class 2 obesity without serious comorbidity with body mass index (BMI) of 35.0 to 35.9 in adult, unspecified obesity type  PLAN:  Depression with Emotional Eating Behaviors We discussed behavior modification techniques today to help Laura Perez deal with her emotional eating and depression. She has agreed to continue to take Lexapro 10 mg qd and to work on cognitive behavioral therapy and will follow up as directed.  Vitamin D Deficiency Laura Perez was informed that low vitamin D levels contributes to fatigue and are associated with obesity, breast, and colon cancer. She agrees to continue to take prescription Vit D @50 ,000 IU every week, refill for 1 month and she will follow up for routine testing of vitamin D, at least 2-3 times per year. She was informed of the risk of over-replacement of vitamin D and agrees to not increase her dose unless he discusses this with [place]  first.  Obesity Laura Perez is currently in the action stage of change. As such, her goal is to continue with weight loss efforts She has agreed to start strict food journaling with 1200 to 1400 calories and 75+ grams of protein daily Laura Perez has been instructed to work up to a goal of 150 minutes of combined cardio and strengthening exercise per week for weight loss and overall health benefits. We discussed the following Behavioral Modification Stratagies today: increasing lean protein intake and work on meal planning and easy cooking plans  Laura Perez has agreed to follow up with our clinic in 2 weeks. She was informed of the importance of frequent follow up visits to maximize her success with intensive lifestyle modifications for her multiple health conditions.  Laura Perez, am  acting as scribe for Quillian Quince, MD  I have reviewed the above documentation for accuracy and completeness, and I agree with the above. -Quillian Quince, MD

## 2016-07-11 MED FILL — VYVANSE 70 MG CAPSULE: 70 | 30 days supply | Qty: 30 | Fill #0

## 2016-07-15 DIAGNOSIS — F902 Attention-deficit hyperactivity disorder, combined type: Secondary | ICD-10-CM | POA: Diagnosis not present

## 2016-07-21 ENCOUNTER — Ambulatory Visit (INDEPENDENT_AMBULATORY_CARE_PROVIDER_SITE_OTHER): Payer: 59 | Admitting: Family Medicine

## 2016-07-21 ENCOUNTER — Encounter (INDEPENDENT_AMBULATORY_CARE_PROVIDER_SITE_OTHER): Payer: Self-pay

## 2016-07-22 ENCOUNTER — Telehealth (INDEPENDENT_AMBULATORY_CARE_PROVIDER_SITE_OTHER): Payer: Self-pay | Admitting: Family Medicine

## 2016-07-22 NOTE — Telephone Encounter (Signed)
Pt called in yesterday to cancel and r/s her appt. She called in prior to her appt stating she did not have a ride. I advised the pt it would be a $25 no show/reschedule fee and I would need the payment before I rescheduled her. She agreed and said her mother would come and pay the fee. SS

## 2016-07-28 ENCOUNTER — Encounter: Payer: Self-pay | Admitting: Family Medicine

## 2016-08-05 ENCOUNTER — Ambulatory Visit (INDEPENDENT_AMBULATORY_CARE_PROVIDER_SITE_OTHER): Payer: 59 | Admitting: Family Medicine

## 2016-08-05 VITALS — BP 114/78 | HR 69 | Temp 97.8°F | Ht 66.0 in | Wt 218.0 lb

## 2016-08-05 DIAGNOSIS — E669 Obesity, unspecified: Secondary | ICD-10-CM | POA: Diagnosis not present

## 2016-08-05 DIAGNOSIS — E559 Vitamin D deficiency, unspecified: Secondary | ICD-10-CM

## 2016-08-05 DIAGNOSIS — Z6835 Body mass index (BMI) 35.0-35.9, adult: Secondary | ICD-10-CM

## 2016-08-05 MED ORDER — VITAMIN D (ERGOCALCIFEROL) 1.25 MG (50000 UNIT) PO CAPS: 50000.0000 [IU] | ORAL_CAPSULE | ORAL | 0 refills | Status: DC

## 2016-08-05 MED FILL — VIT D2 1.25 MG (50,000 UNIT: 1.25 MG | 28 days supply | Qty: 4 | Fill #0

## 2016-08-05 NOTE — Progress Notes (Signed)
Office: [phone]  /  Fax: [phone]   HPI:   Chief Complaint: OBESITY Laura Perez is here to discuss her progress with her obesity treatment plan. She is following her eating plan approximately 100 % of the time and states she is exercising 15 minutes 3 times per week. Laura Perez has done well maintaining her weight. She is not journaling, mostly portion control/smart choices.  Her weight is 218 lb (98.9 kg) today and has maintained weight over a period of 4 weeks since her last visit. She has gained 1 lb since starting treatment with Laura Perez.  Vitamin D deficiency Laura Perez has a diagnosis of vitamin D deficiency. She was put on vit D and not yet at goal. She denies nausea, vomiting or muscle weakness.  Wt Readings from Last 500 Encounters:  08/05/16 218 lb (98.9 kg)  07/09/16 218 lb (98.9 kg)  06/24/16 218 lb (98.9 kg)  05/29/16 218 lb (98.9 kg)  05/13/16 217 lb 12.8 oz (98.8 kg)  04/09/16 218 lb (98.9 kg)  05/08/15 219 lb (99.3 kg)  12/11/14 211 lb (95.7 kg)  11/01/14 198 lb (89.8 kg)  10/25/14 198 lb (89.8 kg)     ALLERGIES: No Known Allergies  MEDICATIONS: Current Outpatient Prescriptions on File Prior to Visit  Medication Sig Dispense Refill  . beclomethasone (QVAR) 80 MCG/ACT inhaler Inhale 1 puff into the lungs as needed.    . diclofenac (VOLTAREN) 75 MG EC tablet Take 1 tablet (75 mg total) by mouth 2 (two) times daily. 60 tablet 1  . escitalopram (LEXAPRO) 10 MG tablet Take 10 mg by mouth daily.    Marland Kitchen. lisdexamfetamine (VYVANSE) 50 MG capsule Take 50 mg by mouth daily.    . ondansetron (ZOFRAN ODT) 4 MG disintegrating tablet Take one tab by mouth Q6hr prn nausea (Dissolve under tongue) 12 tablet 0  . vortioxetine HBr (TRINTELLIX) 5 MG TABS Take 1 tablet by mouth every morning.     No current facility-administered medications on file prior to visit.     PAST MEDICAL HISTORY: Past Medical History:  Diagnosis Date  . ADHD   . Anxiety   . Asthma   . Depression   .  GERD (gastroesophageal reflux disease)   . IBS (irritable bowel syndrome)   . PCOS (polycystic ovarian syndrome)     PAST SURGICAL HISTORY: No past surgical history on file.  SOCIAL HISTORY: Social History  Substance Use Topics  . Smoking status: Never Smoker  . Smokeless tobacco: Never Used  . Alcohol use No    FAMILY HISTORY: Family History  Problem Relation Age of Onset  . Adopted: Yes    ROS: Review of Systems  Constitutional: Negative for weight loss.  Gastrointestinal: Negative for nausea and vomiting.  Musculoskeletal:       Negative muscle weakness    PHYSICAL EXAM: Blood pressure 114/78, pulse 69, temperature 97.8 F (36.6 C), temperature source Oral, height 5\' 6"  (1.676 m), weight 218 lb (98.9 kg), last menstrual period 06/07/2016, SpO2 95 %. Body mass index is 35.19 kg/m. Physical Exam  Constitutional: She is oriented to person, place, and time. She appears well-developed and well-nourished.  Cardiovascular: Normal rate.   Pulmonary/Chest: Effort normal.  Musculoskeletal: Normal range of motion.  Neurological: She is oriented to person, place, and time.  Skin: Skin is warm and dry.  Psychiatric: She has a normal mood and affect. Her behavior is normal.  Vitals reviewed.   RECENT LABS AND TESTS: BMET    Component Value Date/Time  NA 142 05/15/2016 1156   K 4.7 05/15/2016 1156   CL 100 05/15/2016 1156   CO2 22 05/15/2016 1156   GLUCOSE 98 05/15/2016 1156   BUN 16 05/15/2016 1156   CREATININE 0.87 05/15/2016 1156   CALCIUM 9.4 05/15/2016 1156   GFRNONAA 94 05/15/2016 1156   GFRAA 109 05/15/2016 1156   Lab Results  Component Value Date   HGBA1C 5.4 05/15/2016   Lab Results  Component Value Date   INSULIN 30.2 (H) 05/15/2016   CBC    Component Value Date/Time   WBC 8.9 05/15/2016 1156   RBC 4.52 05/15/2016 1156   HCT 38.4 05/15/2016 1156   MCV 85 05/15/2016 1156   MCH 27.7 05/15/2016 1156   MCHC 32.6 05/15/2016 1156   RDW 14.1  05/15/2016 1156   LYMPHSABS 2.9 05/15/2016 1156   EOSABS 0.1 05/15/2016 1156   BASOSABS 0.1 05/15/2016 1156   Iron/TIBC/Ferritin/ %Sat No results found for: IRON, TIBC, FERRITIN, IRONPCTSAT Lipid Panel     Component Value Date/Time   CHOL 125 05/15/2016 1156   TRIG 133 05/15/2016 1156   HDL 38 (L) 05/15/2016 1156   LDLCALC 60 05/15/2016 1156   Hepatic Function Panel     Component Value Date/Time   PROT 7.0 05/15/2016 1156   ALBUMIN 4.4 05/15/2016 1156   AST 26 05/15/2016 1156   ALT 29 05/15/2016 1156   ALKPHOS 106 05/15/2016 1156   BILITOT 0.4 05/15/2016 1156      Component Value Date/Time   TSH 1.330 05/15/2016 1156    ASSESSMENT AND PLAN: Vitamin D deficiency - Plan: Vitamin D, Ergocalciferol, (DRISDOL) 50000 units CAPS capsule  Class 2 obesity without serious comorbidity with body mass index (BMI) of 35.0 to 35.9 in adult, unspecified obesity type  PLAN:  Vitamin D Deficiency Laura Perez was informed that low vitamin D levels contributes to fatigue and are associated with obesity, breast, and colon cancer. She agrees to continue to take prescription Vit D @50 ,000 IU every week #4 with no refills and will follow up for routine testing of vitamin D, at least 2-3 times per year. She was informed of the risk of over-replacement of vitamin D and agrees to not increase her dose unless he discusses this with [place] first.  Obesity Laura Perez is not currently in the action stage of change. We will support her maintaining her weight until she moves into the action stage of change. As such, her goal is to maintain weight for now She has agreed to keep a food journal with 1200 to 1400 calories and 75+ grams of protein daily Laura Perez has been instructed to work up to a goal of 150 minutes of combined cardio and strengthening exercise per week or increase walking 15 minutes 5 times per week for weight loss and overall health benefits. We discussed the following Behavioral Modification  Stratagies today: increasing lean protein intake and decreasing simple carbohydrates   Laura Perez has agreed to follow up with our clinic in 2 weeks. She was informed of the importance of frequent follow up visits to maximize her success with intensive lifestyle modifications for her multiple health conditions.  I, Nevada Crane, am acting as scribe for Quillian Quince, MD  I have reviewed the above documentation for accuracy and completeness, and I agree with the above. -Quillian Quince, MD

## 2016-08-19 ENCOUNTER — Encounter (INDEPENDENT_AMBULATORY_CARE_PROVIDER_SITE_OTHER): Payer: Self-pay | Admitting: Family Medicine

## 2016-08-19 ENCOUNTER — Ambulatory Visit (INDEPENDENT_AMBULATORY_CARE_PROVIDER_SITE_OTHER): Payer: 59 | Admitting: Family Medicine

## 2016-08-19 VITALS — BP 103/72 | HR 88 | Temp 97.9°F | Ht 66.0 in | Wt 218.0 lb

## 2016-08-19 DIAGNOSIS — E669 Obesity, unspecified: Secondary | ICD-10-CM

## 2016-08-19 DIAGNOSIS — Z6835 Body mass index (BMI) 35.0-35.9, adult: Secondary | ICD-10-CM | POA: Diagnosis not present

## 2016-08-19 DIAGNOSIS — E559 Vitamin D deficiency, unspecified: Secondary | ICD-10-CM

## 2016-08-19 DIAGNOSIS — Z9189 Other specified personal risk factors, not elsewhere classified: Secondary | ICD-10-CM | POA: Diagnosis not present

## 2016-08-19 DIAGNOSIS — E8881 Metabolic syndrome: Secondary | ICD-10-CM | POA: Diagnosis not present

## 2016-08-19 MED ORDER — VITAMIN D (ERGOCALCIFEROL) 1.25 MG (50000 UNIT) PO CAPS: 50000.0000 [IU] | ORAL_CAPSULE | ORAL | 0 refills | Status: DC

## 2016-08-19 NOTE — Progress Notes (Signed)
Office: [phone]  /  Fax: [phone]   HPI:   Chief Complaint: OBESITY Laura Perez is here to discuss her progress with her obesity treatment plan. She is following her eating plan approximately 100 % of the time and states she is exercising 15 minutes 2 times per week. Reality journaled on and off and has done well with maintaining weight. She has increased walking and is trying to do 15 minutes 2 to 3 times per week. Although she is trying to eat healthier but resistant to making the changes we have requested her to make. Her weight is 218 lb (98.9 kg) today and has maintained weight over a period of 2 weeks since her last visit. She has gained  1 lb since starting treatment with [place].  Vitamin D deficiency Laura Perez has a diagnosis of vitamin D deficiency. She is currently taking vit D, last labs not at goal and denies nausea, vomiting or muscle weakness. She is due for labs.  Insulin Resistance Laura Perez has a diagnosis of insulin resistance based on her elevated fasting insulin level >5. Although Laura Perez's blood glucose readings are still under good control, insulin resistance puts her at greater risk of metabolic syndrome and diabetes. She is not taking metformin currently, she is attempting to diet control but has struggled to follow her diet prescription strictly. She continues to work on diet and exercise to decrease risk of diabetes.  At risk for diabetes Laura Perez is at higher than average risk for developing diabetes due to her obesity and insulin resistance. She currently denies polyuria or polydipsia.  Wt Readings from Last 500 Encounters:  08/19/16 218 lb (98.9 kg)  08/05/16 218 lb (98.9 kg)  07/09/16 218 lb (98.9 kg)  06/24/16 218 lb (98.9 kg)  05/29/16 218 lb (98.9 kg)  05/13/16 217 lb 12.8 oz (98.8 kg)  04/09/16 218 lb (98.9 kg)  05/08/15 219 lb (99.3 kg)  12/11/14 211 lb (95.7 kg)  11/01/14 198 lb (89.8 kg)  10/25/14 198 lb (89.8 kg)     ALLERGIES: No Known  Allergies  MEDICATIONS: Current Outpatient Prescriptions on File Prior to Visit  Medication Sig Dispense Refill  . beclomethasone (QVAR) 80 MCG/ACT inhaler Inhale 1 puff into the lungs as needed.    . diclofenac (VOLTAREN) 75 MG EC tablet Take 1 tablet (75 mg total) by mouth 2 (two) times daily. 60 tablet 1  . escitalopram (LEXAPRO) 10 MG tablet Take 10 mg by mouth daily.    Marland Kitchen lisdexamfetamine (VYVANSE) 50 MG capsule Take 50 mg by mouth daily.    . ondansetron (ZOFRAN ODT) 4 MG disintegrating tablet Take one tab by mouth Q6hr prn nausea (Dissolve under tongue) 12 tablet 0  . vortioxetine HBr (TRINTELLIX) 5 MG TABS Take 1 tablet by mouth every morning.     No current facility-administered medications on file prior to visit.     PAST MEDICAL HISTORY: Past Medical History:  Diagnosis Date  . ADHD   . Anxiety   . Asthma   . Depression   . GERD (gastroesophageal reflux disease)   . IBS (irritable bowel syndrome)   . PCOS (polycystic ovarian syndrome)     PAST SURGICAL HISTORY: No past surgical history on file.  SOCIAL HISTORY: Social History  Substance Use Topics  . Smoking status: Never Smoker  . Smokeless tobacco: Never Used  . Alcohol use No    FAMILY HISTORY: Family History  Problem Relation Age of Onset  . Adopted: Yes    ROS: Review of  Systems  Constitutional: Negative for weight loss.  Gastrointestinal: Negative for nausea and vomiting.  Genitourinary: Negative for frequency.  Musculoskeletal:       Negative muscle weakness  Endo/Heme/Allergies: Negative for polydipsia.    PHYSICAL EXAM: Blood pressure 103/72, pulse 88, temperature 97.9 F (36.6 C), temperature source Oral, height 5\' 6"  (1.676 m), weight 218 lb (98.9 kg), SpO2 97 %. Body mass index is 35.19 kg/m. Physical Exam  Constitutional: She is oriented to person, place, and time. She appears well-developed and well-nourished.  Cardiovascular: Normal rate.   Pulmonary/Chest: Effort normal.    Musculoskeletal: Normal range of motion.  Neurological: She is oriented to person, place, and time.  Skin: Skin is warm and dry.  Psychiatric: She has a normal mood and affect. Her behavior is normal.  Vitals reviewed.   RECENT LABS AND TESTS: BMET    Component Value Date/Time   NA 142 05/15/2016 1156   K 4.7 05/15/2016 1156   CL 100 05/15/2016 1156   CO2 22 05/15/2016 1156   GLUCOSE 98 05/15/2016 1156   BUN 16 05/15/2016 1156   CREATININE 0.87 05/15/2016 1156   CALCIUM 9.4 05/15/2016 1156   GFRNONAA 94 05/15/2016 1156   GFRAA 109 05/15/2016 1156   Lab Results  Component Value Date   HGBA1C 5.4 05/15/2016   Lab Results  Component Value Date   INSULIN 30.2 (H) 05/15/2016   CBC    Component Value Date/Time   WBC 8.9 05/15/2016 1156   RBC 4.52 05/15/2016 1156   HCT 38.4 05/15/2016 1156   MCV 85 05/15/2016 1156   MCH 27.7 05/15/2016 1156   MCHC 32.6 05/15/2016 1156   RDW 14.1 05/15/2016 1156   LYMPHSABS 2.9 05/15/2016 1156   EOSABS 0.1 05/15/2016 1156   BASOSABS 0.1 05/15/2016 1156   Iron/TIBC/Ferritin/ %Sat No results found for: IRON, TIBC, FERRITIN, IRONPCTSAT Lipid Panel     Component Value Date/Time   CHOL 125 05/15/2016 1156   TRIG 133 05/15/2016 1156   HDL 38 (L) 05/15/2016 1156   LDLCALC 60 05/15/2016 1156   Hepatic Function Panel     Component Value Date/Time   PROT 7.0 05/15/2016 1156   ALBUMIN 4.4 05/15/2016 1156   AST 26 05/15/2016 1156   ALT 29 05/15/2016 1156   ALKPHOS 106 05/15/2016 1156   BILITOT 0.4 05/15/2016 1156      Component Value Date/Time   TSH 1.330 05/15/2016 1156    ASSESSMENT AND PLAN: Vitamin D deficiency - Plan: Comprehensive metabolic panel, VITAMIN D 25 Hydroxy (Vit-D Deficiency, Fractures), Vitamin D, Ergocalciferol, (DRISDOL) 50000 units CAPS capsule  Insulin resistance - Plan: Hemoglobin A1c, Insulin, random, Lipid Panel With LDL/HDL Ratio  At risk for diabetes mellitus - Plan: Hemoglobin A1c, Insulin,  random  Class 2 obesity without serious comorbidity with body mass index (BMI) of 35.0 to 35.9 in adult, unspecified obesity type  PLAN:  Vitamin D Deficiency Laura Perez was informed that low vitamin D levels contributes to fatigue and are associated with obesity, breast, and colon cancer. She agrees to continue to take prescription Vit D @50 ,000 IU every week, we will refill for 1 month and will check labs and follow up for routine testing of vitamin D, at least 2-3 times per year. She was informed of the risk of over-replacement of vitamin D and agrees to not increase her dose unless he discusses this with Laura Perez first.  Insulin Resistance Laura Perez will continue to work on weight loss, exercise, and decreasing simple carbohydrates in her  diet to help decrease the risk of diabetes. She was informed that eating too many simple carbohydrates or too many calories at one sitting increases the likelihood of GI side effects. We will check labs and Laura Perez agreed to follow up with [place] as directed to monitor her progress.  Diabetes risk counselling Laura Perez was given extended (at least 15 minutes) diabetes prevention counseling today. She is 26 y.o. female and has risk factors for diabetes including obesity. We discussed intensive lifestyle modifications today with an emphasis on weight loss as well as increasing exercise and decreasing simple carbohydrates in her diet.  Obesity Laura Perez is not currently in the action stage of change. As such, her goal is to continue with weight loss efforts She has agreed to keep a food journal with 1200 to 1400 calories and 75+ grams of protein daily Laura Perez has been instructed to work up to a goal of 150 minutes of combined cardio and strengthening exercise per week for weight loss and overall health benefits. We discussed the following Behavioral Modification Stratagies today: increasing lean protein intake, decreasing simple carbohydrates , increasing vegetables, increasing  lower sugar fruits and dealing with family or coworker sabotage  Laura Perez has agreed to follow up with our clinic in 2 weeks. She was informed of the importance of frequent follow up visits to maximize her success with intensive lifestyle modifications for her multiple health conditions.  I, Nevada Crane, am acting as scribe for Quillian Quince, MD  I have reviewed the above documentation for accuracy and completeness, and I agree with the above. -Quillian Quince, MD

## 2016-08-20 LAB — LIPID PANEL WITH LDL/HDL RATIO
CHOLESTEROL TOTAL: 125 mg/dL (ref 100–199)
HDL: 33 mg/dL — ABNORMAL LOW (ref >39)
LDL CALC: 48 mg/dL (ref 0–99)
LDl/HDL Ratio: 1.5 ratio units (ref 0.0–3.2)
Triglycerides: 220 mg/dL — ABNORMAL HIGH (ref 0–149)
VLDL Cholesterol Cal: 44 mg/dL — ABNORMAL HIGH (ref 5–40)

## 2016-08-20 LAB — VITAMIN D 25 HYDROXY (VIT D DEFICIENCY, FRACTURES): VIT D 25 HYDROXY: 27.6 ng/mL — AB (ref 30.0–100.0)

## 2016-08-20 LAB — COMPREHENSIVE METABOLIC PANEL
A/G RATIO: 1.3 (ref 1.2–2.2)
ALBUMIN: 4.3 g/dL (ref 3.5–5.5)
ALK PHOS: 97 IU/L (ref 39–117)
ALT: 21 IU/L (ref 0–32)
AST: 19 IU/L (ref 0–40)
BILIRUBIN TOTAL: 0.4 mg/dL (ref 0.0–1.2)
BUN / CREAT RATIO: 26 — AB (ref 9–23)
BUN: 20 mg/dL (ref 6–20)
CHLORIDE: 101 mmol/L (ref 96–106)
CO2: 25 mmol/L (ref 18–29)
CREATININE: 0.76 mg/dL (ref 0.57–1.00)
Calcium: 9.6 mg/dL (ref 8.7–10.2)
GFR calc non Af Amer: 111 mL/min/{1.73_m2} (ref >59)
GFR, EST AFRICAN AMERICAN: 128 mL/min/{1.73_m2} (ref >59)
GLOBULIN, TOTAL: 3.2 g/dL (ref 1.5–4.5)
Glucose: 81 mg/dL (ref 65–99)
POTASSIUM: 4.8 mmol/L (ref 3.5–5.2)
Sodium: 142 mmol/L (ref 134–144)
Total Protein: 7.5 g/dL (ref 6.0–8.5)

## 2016-08-20 LAB — HEMOGLOBIN A1C
Est. average glucose Bld gHb Est-mCnc: 103 mg/dL
HEMOGLOBIN A1C: 5.2 % (ref 4.8–5.6)

## 2016-08-20 LAB — INSULIN, RANDOM: INSULIN: 25.1 u[IU]/mL — AB (ref 2.6–24.9)

## 2016-09-03 ENCOUNTER — Ambulatory Visit (INDEPENDENT_AMBULATORY_CARE_PROVIDER_SITE_OTHER): Payer: 59 | Admitting: Family Medicine

## 2016-09-03 VITALS — BP 110/75 | HR 76 | Temp 97.6°F | Ht 66.0 in | Wt 220.0 lb

## 2016-09-03 DIAGNOSIS — Z6835 Body mass index (BMI) 35.0-35.9, adult: Secondary | ICD-10-CM

## 2016-09-03 DIAGNOSIS — E559 Vitamin D deficiency, unspecified: Secondary | ICD-10-CM

## 2016-09-03 DIAGNOSIS — E669 Obesity, unspecified: Secondary | ICD-10-CM | POA: Diagnosis not present

## 2016-09-03 DIAGNOSIS — E8881 Metabolic syndrome: Secondary | ICD-10-CM | POA: Diagnosis not present

## 2016-09-03 DIAGNOSIS — E88819 Insulin resistance, unspecified: Secondary | ICD-10-CM

## 2016-09-03 MED ORDER — VITAMIN D (ERGOCALCIFEROL) 1.25 MG (50000 UNIT) PO CAPS: 50000.0000 [IU] | ORAL_CAPSULE | ORAL | 0 refills | Status: DC

## 2016-09-03 MED FILL — VIT D2 1.25 MG (50,000 UNIT: 1.25 MG | 28 days supply | Qty: 4 | Fill #0

## 2016-09-03 NOTE — Progress Notes (Signed)
Office: [phone]  /  Fax: [phone]   HPI:   Chief Complaint: OBESITY Laura Perez is here to discuss her progress with her obesity treatment plan. She is following her eating plan approximately 100 % of the time and states she is exercising 30 minutes 3 times per week. Laura Perez Is retaining fluid today. She has maintained her weight well overall. Laura Perez states she is journaling more and states she is meeting protein goal. Her weight is 220 lb (99.8 kg) today and has had a weight gain of 2 lbs over a period of 2 weeks since her last visit. She has gained 3 lbs since starting treatment with [place].  Vitamin D deficiency Laura Perez has a diagnosis of vitamin D deficiency. She is slowly improving on vitamin D, not yet at goal and denies nausea, vomiting or muscle weakness.  Insulin Resistance Laura Perez has a diagnosis of insulin resistance based on her elevated fasting insulin level >5. Although Laura Perez's blood glucose readings are still under good control, insulin resistance puts her at greater risk of metabolic syndrome and diabetes. She is not taking metformin currently and continues to work on diet and exercise to decrease risk of diabetes. Hgb A1c, glucose and insulin all improved.  Wt Readings from Last 500 Encounters:  09/03/16 220 lb (99.8 kg)  08/19/16 218 lb (98.9 kg)  08/05/16 218 lb (98.9 kg)  07/09/16 218 lb (98.9 kg)  06/24/16 218 lb (98.9 kg)  05/29/16 218 lb (98.9 kg)  05/13/16 217 lb 12.8 oz (98.8 kg)  04/09/16 218 lb (98.9 kg)  05/08/15 219 lb (99.3 kg)  12/11/14 211 lb (95.7 kg)  11/01/14 198 lb (89.8 kg)  10/25/14 198 lb (89.8 kg)     ALLERGIES: No Known Allergies  MEDICATIONS: Current Outpatient Prescriptions on File Prior to Visit  Medication Sig Dispense Refill  . beclomethasone (QVAR) 80 MCG/ACT inhaler Inhale 1 puff into the lungs as needed.    . diclofenac (VOLTAREN) 75 MG EC tablet Take 1 tablet (75 mg total) by mouth 2 (two) times daily. 60 tablet 1  .  escitalopram (LEXAPRO) 10 MG tablet Take 10 mg by mouth daily.    Marland Kitchen lisdexamfetamine (VYVANSE) 50 MG capsule Take 50 mg by mouth daily.    . ondansetron (ZOFRAN ODT) 4 MG disintegrating tablet Take one tab by mouth Q6hr prn nausea (Dissolve under tongue) 12 tablet 0  . Vitamin D, Ergocalciferol, (DRISDOL) 50000 units CAPS capsule Take 1 capsule (50,000 Units total) by mouth every 7 (seven) days. 4 capsule 0  . vortioxetine HBr (TRINTELLIX) 5 MG TABS Take 1 tablet by mouth every morning.     No current facility-administered medications on file prior to visit.     PAST MEDICAL HISTORY: Past Medical History:  Diagnosis Date  . ADHD   . Anxiety   . Asthma   . Depression   . GERD (gastroesophageal reflux disease)   . IBS (irritable bowel syndrome)   . PCOS (polycystic ovarian syndrome)     PAST SURGICAL HISTORY: No past surgical history on file.  SOCIAL HISTORY: Social History  Substance Use Topics  . Smoking status: Never Smoker  . Smokeless tobacco: Never Used  . Alcohol use No    FAMILY HISTORY: Family History  Problem Relation Age of Onset  . Adopted: Yes    ROS: Review of Systems  Constitutional: Negative for weight loss.  Gastrointestinal: Negative for nausea and vomiting.  Musculoskeletal:       Negative muscle weakness    PHYSICAL  EXAM: Blood pressure 110/75, pulse 76, temperature 97.6 F (36.4 C), temperature source Oral, height  (1.676 m), weight 220 lb (99.8 kg), last menstrual period 04/09/2016, SpO2 99 %. Body mass index is 35.51 kg/m. Physical Exam  Constitutional: She is oriented to person, place, and time. She appears well-developed and well-nourished.  Cardiovascular: Normal rate.   Pulmonary/Chest: Effort normal.  Musculoskeletal: Normal range of motion.  Neurological: She is oriented to person, place, and time.  Skin: Skin is warm and dry.  Psychiatric: She has a normal mood and affect. Her behavior is normal.  Vitals reviewed.   RECENT  LABS AND TESTS: BMET    Component Value Date/Time   NA 142 08/19/2016 1407   K 4.8 08/19/2016 1407   CL 101 08/19/2016 1407   CO2 25 08/19/2016 1407   GLUCOSE 81 08/19/2016 1407   BUN 20 08/19/2016 1407   CREATININE 0.76 08/19/2016 1407   CALCIUM 9.6 08/19/2016 1407   GFRNONAA 111 08/19/2016 1407   GFRAA 128 08/19/2016 1407   Lab Results  Component Value Date   HGBA1C 5.2 08/19/2016   HGBA1C 5.4 05/15/2016   Lab Results  Component Value Date   INSULIN 25.1 (H) 08/19/2016   INSULIN 30.2 (H) 05/15/2016   CBC    Component Value Date/Time   WBC 8.9 05/15/2016 1156   RBC 4.52 05/15/2016 1156   HCT 38.4 05/15/2016 1156   MCV 85 05/15/2016 1156   MCH 27.7 05/15/2016 1156   MCHC 32.6 05/15/2016 1156   RDW 14.1 05/15/2016 1156   LYMPHSABS 2.9 05/15/2016 1156   EOSABS 0.1 05/15/2016 1156   BASOSABS 0.1 05/15/2016 1156   Iron/TIBC/Ferritin/ %Sat No results found for: IRON, TIBC, FERRITIN, IRONPCTSAT Lipid Panel     Component Value Date/Time   CHOL 125 08/19/2016 1407   TRIG 220 (H) 08/19/2016 1407   HDL 33 (L) 08/19/2016 1407   LDLCALC 48 08/19/2016 1407   Hepatic Function Panel     Component Value Date/Time   PROT 7.5 08/19/2016 1407   ALBUMIN 4.3 08/19/2016 1407   AST 19 08/19/2016 1407   ALT 21 08/19/2016 1407   ALKPHOS 97 08/19/2016 1407   BILITOT 0.4 08/19/2016 1407      Component Value Date/Time   TSH 1.330 05/15/2016 1156    ASSESSMENT AND PLAN: Vitamin D deficiency - Plan: Vitamin D, Ergocalciferol, (DRISDOL) 50000 units CAPS capsule  Insulin resistance  Class 2 obesity without serious comorbidity with body mass index (BMI) of 35.0 to 35.9 in adult, unspecified obesity type  PLAN:  Vitamin D Deficiency Nikaya was informed that low vitamin D levels contributes to fatigue and are associated with obesity, breast, and colon cancer. She agrees to continue to take prescription Vit D ,000 IU every week, we will refill for 1 month and will follow up  for routine testing of vitamin D, at least 2-3 times per year. She was informed of the risk of over-replacement of vitamin D and agrees to not increase her dose unless he discusses this with [place] first.  Insulin Resistance Josslyn will continue to work on weight loss, exercise, and decreasing simple carbohydrates in her diet to help decrease the risk of diabetes. We dicussed metformin including benefits and risks. She was informed that eating too many simple carbohydrates or too many calories at one sitting increases the likelihood of GI side effects. Jeweliana declined metformin for now and prescription was not written today. Jaylon agreed to follow up with [place] as directed to monitor  her progress.  Obesity Andromeda is not currently in the action stage of change. As such, her goal is to try to maintain her weight and not gain further. We will reassess her readiness to change at her next visit. She has agreed to keep a food journal with 1200 to 1400 calories and 70 grams of protein daily Sharrell has been instructed to work up to a goal of 150 minutes of combined cardio and strengthening exercise per week for weight loss and overall health benefits. We discussed the following Behavioral Modification Stratagies today: increasing H2O, increasing lean protein intake, increasing vegetables and increasing fiber rich foods  Krystiana has agreed to follow up with our clinic in 2 weeks. She was informed of the importance of frequent follow up visits to maximize her success with intensive lifestyle modifications for her multiple health conditions.  I, [place] Crane, am acting as scribe for Quillian Quince, MD  I have reviewed the above documentation for accuracy and completeness, and I agree with the above. -Quillian Quince, MD

## 2016-09-05 MED FILL — TRINTELLIX 5 MG TABLET: 5 | 30 days supply | Qty: 30 | Fill #0

## 2016-09-23 ENCOUNTER — Ambulatory Visit (INDEPENDENT_AMBULATORY_CARE_PROVIDER_SITE_OTHER): Payer: 59 | Admitting: Family Medicine

## 2016-09-23 VITALS — BP 108/73 | HR 93 | Temp 97.9°F | Ht 66.0 in

## 2016-09-23 DIAGNOSIS — Z9189 Other specified personal risk factors, not elsewhere classified: Secondary | ICD-10-CM

## 2016-09-23 DIAGNOSIS — Z6835 Body mass index (BMI) 35.0-35.9, adult: Secondary | ICD-10-CM | POA: Diagnosis not present

## 2016-09-23 DIAGNOSIS — E669 Obesity, unspecified: Secondary | ICD-10-CM | POA: Diagnosis not present

## 2016-09-23 DIAGNOSIS — E559 Vitamin D deficiency, unspecified: Secondary | ICD-10-CM

## 2016-09-23 MED ORDER — VITAMIN D (ERGOCALCIFEROL) 1.25 MG (50000 UNIT) PO CAPS: 50000.0000 [IU] | ORAL_CAPSULE | ORAL | 0 refills | Status: DC

## 2016-09-23 MED ORDER — LIRAGLUTIDE -WEIGHT MANAGEMENT 18 MG/3ML ~~LOC~~ SOPN: 3.0000 mg | PEN_INJECTOR | Freq: Every morning | SUBCUTANEOUS | 0 refills | Status: AC

## 2016-09-23 NOTE — Progress Notes (Signed)
Office: [phone]  /  Fax: [phone]   HPI:   Chief Complaint: OBESITY Laura Perez is here to discuss her progress with her obesity treatment plan. She is on the keep a food journal daily plan and is following her eating plan approximately 100 % of the time. She states she is walking 15 to 20 minutes 2 times per week. Laura Perez is still struggling to lose weight. She is journaling on and off but mostly guessing calories. She states hunger is a problem. Her weight is   today and has had a weight gain of 1 lb over a period of 3 weeks since her last visit. She has gained 3 lbs since starting treatment with [place].  Vitamin D deficiency Laura Perez has a diagnosis of vitamin D deficiency. She is currently taking vit D, not yet at goal and denies nausea, vomiting or muscle weakness.  At risk for diabetes Laura Perez is at higher than average risk for developing diabetes due to her obesity. She currently denies polyuria or polydipsia.  Wt Readings from Last 500 Encounters:  09/03/16 220 lb (99.8 kg)  08/19/16 218 lb (98.9 kg)  08/05/16 218 lb (98.9 kg)  07/09/16 218 lb (98.9 kg)  06/24/16 218 lb (98.9 kg)  05/29/16 218 lb (98.9 kg)  05/13/16 217 lb 12.8 oz (98.8 kg)  04/09/16 218 lb (98.9 kg)  05/08/15 219 lb (99.3 kg)  12/11/14 211 lb (95.7 kg)  11/01/14 198 lb (89.8 kg)  10/25/14 198 lb (89.8 kg)     ALLERGIES: No Known Allergies  MEDICATIONS: Current Outpatient Prescriptions on File Prior to Visit  Medication Sig Dispense Refill  . beclomethasone (QVAR) 80 MCG/ACT inhaler Inhale 1 puff into the lungs as needed.    . diclofenac (VOLTAREN) 75 MG EC tablet Take 1 tablet (75 mg total) by mouth 2 (two) times daily. 60 tablet 1  . escitalopram (LEXAPRO) 10 MG tablet Take 10 mg by mouth daily.    Marland Kitchen lisdexamfetamine (VYVANSE) 50 MG capsule Take 50 mg by mouth daily.    . ondansetron (ZOFRAN ODT) 4 MG disintegrating tablet Take one tab by mouth Q6hr prn nausea (Dissolve under tongue) 12 tablet  0  . Vitamin D, Ergocalciferol, (DRISDOL) 50000 units CAPS capsule Take 1 capsule (50,000 Units total) by mouth every 7 (seven) days. 4 capsule 0  . vortioxetine HBr (TRINTELLIX) 5 MG TABS Take 1 tablet by mouth every morning.     No current facility-administered medications on file prior to visit.     PAST MEDICAL HISTORY: Past Medical History:  Diagnosis Date  . ADHD   . Anxiety   . Asthma   . Depression   . GERD (gastroesophageal reflux disease)   . IBS (irritable bowel syndrome)   . PCOS (polycystic ovarian syndrome)     PAST SURGICAL HISTORY: No past surgical history on file.  SOCIAL HISTORY: Social History  Substance Use Topics  . Smoking status: Never Smoker  . Smokeless tobacco: Never Used  . Alcohol use No    FAMILY HISTORY: Family History  Problem Relation Age of Onset  . Adopted: Yes    ROS: Review of Systems  Constitutional: Negative for weight loss.  Gastrointestinal: Negative for nausea and vomiting.  Genitourinary: Negative for frequency.  Musculoskeletal:       Negative muscle weakness  Endo/Heme/Allergies: Negative for polydipsia.    PHYSICAL EXAM: Blood pressure 108/73, pulse 93, temperature 97.9 F (36.6 C), temperature source Oral, height  (1.676 m), SpO2 96 %. There is  no height or weight on file to calculate BMI. Physical Exam  Constitutional: She is oriented to person, place, and time. She appears well-developed and well-nourished.  Cardiovascular: Normal rate.   Pulmonary/Chest: Effort normal.  Musculoskeletal: Normal range of motion.  Neurological: She is oriented to person, place, and time.  Skin: Skin is warm and dry.  Psychiatric: She has a normal mood and affect. Her behavior is normal.  Vitals reviewed.   RECENT LABS AND TESTS: BMET    Component Value Date/Time   NA 142 08/19/2016 1407   K 4.8 08/19/2016 1407   CL 101 08/19/2016 1407   CO2 25 08/19/2016 1407   GLUCOSE 81 08/19/2016 1407   BUN 20 08/19/2016 1407     CREATININE 0.76 08/19/2016 1407   CALCIUM 9.6 08/19/2016 1407   GFRNONAA 111 08/19/2016 1407   GFRAA 128 08/19/2016 1407   Lab Results  Component Value Date   HGBA1C 5.2 08/19/2016   HGBA1C 5.4 05/15/2016   Lab Results  Component Value Date   INSULIN 25.1 (H) 08/19/2016   INSULIN 30.2 (H) 05/15/2016   CBC    Component Value Date/Time   WBC 8.9 05/15/2016 1156   RBC 4.52 05/15/2016 1156   HCT 38.4 05/15/2016 1156   MCV 85 05/15/2016 1156   MCH 27.7 05/15/2016 1156   MCHC 32.6 05/15/2016 1156   RDW 14.1 05/15/2016 1156   LYMPHSABS 2.9 05/15/2016 1156   EOSABS 0.1 05/15/2016 1156   BASOSABS 0.1 05/15/2016 1156   Iron/TIBC/Ferritin/ %Sat No results found for: IRON, TIBC, FERRITIN, IRONPCTSAT Lipid Panel     Component Value Date/Time   CHOL 125 08/19/2016 1407   TRIG 220 (H) 08/19/2016 1407   HDL 33 (L) 08/19/2016 1407   LDLCALC 48 08/19/2016 1407   Hepatic Function Panel     Component Value Date/Time   PROT 7.5 08/19/2016 1407   ALBUMIN 4.3 08/19/2016 1407   AST 19 08/19/2016 1407   ALT 21 08/19/2016 1407   ALKPHOS 97 08/19/2016 1407   BILITOT 0.4 08/19/2016 1407      Component Value Date/Time   TSH 1.330 05/15/2016 1156    ASSESSMENT AND PLAN: Vitamin D deficiency - Plan: Vitamin D, Ergocalciferol, (DRISDOL) 50000 units CAPS capsule  At risk for diabetes mellitus  Class 2 obesity without serious comorbidity with body mass index (BMI) of 35.0 to 35.9 in adult, unspecified obesity type - Plan: Liraglutide -Weight Management (SAXENDA) 18 MG/3ML SOPN  PLAN:  Vitamin D Deficiency Lisset was informed that low vitamin D levels contributes to fatigue and are associated with obesity, breast, and colon cancer. She agrees to continue to take prescription Vit D ,000 IU every week, we will refill for 1 month and will follow up for routine testing of vitamin D, at least 2-3 times per year. She was informed of the risk of over-replacement of vitamin D and agrees  to not increase her dose unless he discusses this with [place] first. Laura Perez agrees to follow up with our clinic in 2 weeks.  Diabetes risk counselling Laura Perez was given extended (at least 15 minutes) diabetes prevention counseling today. She is 26 y.o. female and has risk factors for diabetes including obesity. We discussed intensive lifestyle modifications today with an emphasis on weight loss as well as increasing exercise and decreasing simple carbohydrates in her diet.  Obesity Laura Perez is currently in the action stage of change. As such, her goal is to continue with weight loss efforts She has agreed to keep a strict  food journal with 1100 to 1400 calories and 75 grams of protein daily Laura Perez has been instructed to work up to a goal of 150 minutes of combined cardio and strengthening exercise per week for weight loss and overall health benefits. We discussed the following Behavioral Modification Stratagies today: increasing lean protein intake, strict journaling and work on meal planning and easy cooking plans We discussed multiple medication options to help Laura Perez with her weight loss efforts but phentermine contraindicated due to her being on vyvanse and we both agreed to start Saxenda 3.0 mg every morning #1 month with no refills and will follow up at agreed upon time.  Laura Perez has agreed to follow up with our clinic in 2 weeks. She was informed of the importance of frequent follow up visits to maximize her success with intensive lifestyle modifications for her multiple health conditions.  I, Nevada Crane, am acting as scribe for Quillian Quince, MD  I have reviewed the above documentation for accuracy and completeness, and I agree with the above. -Quillian Quince, MD

## 2016-09-30 MED FILL — SAXENDA 18 MG/3 ML PEN: 18 | 30 days supply | Qty: 15 | Fill #0

## 2016-10-01 MED FILL — ULTICARE PEN NDL 8MM 31G: 31G X 8 MM | 90 days supply | Qty: 100 | Fill #0

## 2016-10-06 ENCOUNTER — Ambulatory Visit (INDEPENDENT_AMBULATORY_CARE_PROVIDER_SITE_OTHER): Payer: 59 | Admitting: Family Medicine

## 2016-10-06 VITALS — BP 105/70 | HR 82 | Temp 98.5°F | Ht 66.0 in

## 2016-10-06 DIAGNOSIS — E282 Polycystic ovarian syndrome: Secondary | ICD-10-CM | POA: Diagnosis not present

## 2016-10-06 DIAGNOSIS — E669 Obesity, unspecified: Secondary | ICD-10-CM

## 2016-10-06 DIAGNOSIS — E559 Vitamin D deficiency, unspecified: Secondary | ICD-10-CM | POA: Insufficient documentation

## 2016-10-06 DIAGNOSIS — Z6835 Body mass index (BMI) 35.0-35.9, adult: Secondary | ICD-10-CM

## 2016-10-06 DIAGNOSIS — IMO0001 Reserved for inherently not codable concepts without codable children: Secondary | ICD-10-CM | POA: Insufficient documentation

## 2016-10-06 NOTE — Progress Notes (Signed)
Office: [phone]  /  Fax: [phone]   HPI:   Chief Complaint: OBESITY Laura Perez is here to discuss her progress with her obesity treatment plan. She is on the  keep a food journal- [employer]itness Pal and is following her eating plan approximately 100 % of the time. She states she is walking 30 minutes 2 to 3 times per week. Laura Perez is doing well with weight loss. She started Saxenda at 0.6 and noted mild nausea but she aldo had a cold. She is journaling on and off and working on increasing lean protein but has decreased vegetable intake. Her weight is   today and has had a weight loss of 5 pounds over a period of 1 week since her last visit. She has lost 1 lb since starting treatment with us.  Vitamin D deficiency Laura Perez has a diagnosis of vitamin D deficiency. She is currently taking vit D, not yet at goal, but often forgetting to take. She denies nausea, vomiting or muscle weakness.  Polycystic Ovarian Syndrome Laura Perez is working on diet to help with PCOS, last menstrual period was approximately January. She is not sexually active. Laura Perez denies pain currently.   ALLERGIES: No Known Allergies  MEDICATIONS: Current Outpatient Prescriptions on File Prior to Visit  Medication Sig Dispense Refill   beclomethasone (QVAR) 80 MCG/ACT inhaler Inhale 1 puff into the lungs as needed.     diclofenac (VOLTAREN) 75 MG EC tablet Take 1 tablet (75 mg total) by mouth 2 (two) times daily. 60 tablet 1   escitalopram (LEXAPRO) 10 MG tablet Take 10 mg by mouth daily.     Liraglutide -Weight Management (SAXENDA) 18 MG/3ML SOPN Inject 3 mg into the skin every morning. 3 pen 0   lisdexamfetamine (VYVANSE) 50 MG capsule Take 50 mg by mouth daily.     ondansetron (ZOFRAN ODT) 4 MG disintegrating tablet Take one tab by mouth Q6hr prn nausea (Dissolve under tongue) 12 tablet 0   Vitamin D, Ergocalciferol, (DRISDOL) 50000 units CAPS capsule Take 1 capsule (50,000 Units total) by mouth every 7 (seven)  days. 4 capsule 0   vortioxetine HBr (TRINTELLIX) 5 MG TABS Take 1 tablet by mouth every morning.     No current facility-administered medications on file prior to visit.     PAST MEDICAL HISTORY: Past Medical History:  Diagnosis Date   ADHD    Anxiety    Asthma    Depression    GERD (gastroesophageal reflux disease)    IBS (irritable bowel syndrome)    PCOS (polycystic ovarian syndrome)     PAST SURGICAL HISTORY: No past surgical history on file.  SOCIAL HISTORY: Social History  Substance Use Topics   Smoking status: Never Smoker   Smokeless tobacco: Never Used   Alcohol use No    FAMILY HISTORY: Family History  Problem Relation Age of Onset   Adopted: Yes    ROS: Review of Systems  Constitutional: Positive for weight loss.  Gastrointestinal: Negative for nausea and vomiting.  Musculoskeletal:       Negative muscle weakness    PHYSICAL EXAM: Blood pressure 105/70, pulse 82, temperature 98.5 F (36.9 C), temperature source Oral, height 5\' 6"  (1.676 m), last menstrual period 05/26/2016, SpO2 96 %. There is no height or weight on file to calculate BMI. Physical Exam  Constitutional: She is oriented to person, place, and time. She appears well-developed and well-nourished.  Cardiovascular: Normal rate.   Pulmonary/Chest: Effort normal.  Musculoskeletal: Normal range of motion.  Neurological: She  is oriented to person, place, and time.  Skin: Skin is warm and dry.  Psychiatric: She has a normal mood and affect. Her behavior is normal.  Vitals reviewed.   RECENT LABS AND TESTS: BMET    Component Value Date/Time   NA 142 08/19/2016 1407   K 4.8 08/19/2016 1407   CL 101 08/19/2016 1407   CO2 25 08/19/2016 1407   GLUCOSE 81 08/19/2016 1407   BUN 20 08/19/2016 1407   CREATININE 0.76 08/19/2016 1407   CALCIUM 9.6 08/19/2016 1407   GFRNONAA 111 08/19/2016 1407   GFRAA 128 08/19/2016 1407   Lab Results  Component Value Date   HGBA1C 5.2  08/19/2016   HGBA1C 5.4 05/15/2016   Lab Results  Component Value Date   INSULIN 25.1 (H) 08/19/2016   INSULIN 30.2 (H) 05/15/2016   CBC    Component Value Date/Time   WBC 8.9 05/15/2016 1156   RBC 4.52 05/15/2016 1156   HCT 38.4 05/15/2016 1156   MCV 85 05/15/2016 1156   MCH 27.7 05/15/2016 1156   MCHC 32.6 05/15/2016 1156   RDW 14.1 05/15/2016 1156   LYMPHSABS 2.9 05/15/2016 1156   EOSABS 0.1 05/15/2016 1156   BASOSABS 0.1 05/15/2016 1156   Iron/TIBC/Ferritin/ %Sat No results found for: IRON, TIBC, FERRITIN, IRONPCTSAT Lipid Panel     Component Value Date/Time   CHOL 125 08/19/2016 1407   TRIG 220 (H) 08/19/2016 1407   HDL 33 (L) 08/19/2016 1407   LDLCALC 48 08/19/2016 1407   Hepatic Function Panel     Component Value Date/Time   PROT 7.5 08/19/2016 1407   ALBUMIN 4.3 08/19/2016 1407   AST 19 08/19/2016 1407   ALT 21 08/19/2016 1407   ALKPHOS 97 08/19/2016 1407   BILITOT 0.4 08/19/2016 1407      Component Value Date/Time   TSH 1.330 05/15/2016 1156    ASSESSMENT AND PLAN: Vitamin D deficiency  PCOS (polycystic ovarian syndrome)  Class 2 obesity without serious comorbidity with body mass index (BMI) of 35.0 to 35.9 in adult, unspecified obesity type  PLAN:  Vitamin D Deficiency Laura Perez was informed that low vitamin D levels contributes to fatigue and are associated with obesity, breast, and colon cancer. We discussed the importance of taking vitamin D and she agrees to continue to take prescription Vit D @50 ,000 IU every week and we will re-check labs in 6 weeks and will follow up for routine testing of vitamin D, at least 2-3 times per year. She was informed of the risk of over-replacement of vitamin D and agrees to not increase her dose unless he discusses this with [place] first.  Polycystic Ovarian Syndrome Laura Perez agrees to continue to work on diet, exercise and weight loss and follow up with our clinic in 2 weeks.  We spent > than 50% of the 30 minute  visit on the counseling as documented in the note.  Obesity Laura Perez is currently in the action stage of change. As such, her goal is to continue with weight loss efforts She has agreed to keep a food journal with 1100 to 1400 calories and 75 grams of protein daily Laura Perez has been instructed to work up to a goal of 150 minutes of combined cardio and strengthening exercise per week for weight loss and overall health benefits. We discussed the following Behavioral Modification Strategies today: increasing lean protein intake and decreasing simple carbohydrates   Laura Perez has agreed to follow up with our clinic in 2 weeks. She was informed of  the importance of frequent follow up visits to maximize her success with intensive lifestyle modifications for her multiple health conditions.  I, Nevada Crane, am acting as scribe for Quillian Quince, MD  I have reviewed the above documentation for accuracy and completeness, and I agree with the above. -Quillian Quince, MD

## 2016-10-07 DIAGNOSIS — F902 Attention-deficit hyperactivity disorder, combined type: Secondary | ICD-10-CM | POA: Diagnosis not present

## 2016-10-21 MED FILL — VIT D2 1.25 MG (50,000 UNIT: 1.25 MG | 28 days supply | Qty: 4 | Fill #0

## 2016-10-21 MED FILL — TRINTELLIX 5 MG TABLET: 5 | 30 days supply | Qty: 30 | Fill #1

## 2016-10-22 ENCOUNTER — Ambulatory Visit (INDEPENDENT_AMBULATORY_CARE_PROVIDER_SITE_OTHER): Payer: 59 | Admitting: Family Medicine

## 2016-10-22 VITALS — BP 100/67 | HR 90 | Temp 98.2°F | Ht 66.0 in | Wt 217.0 lb

## 2016-10-22 DIAGNOSIS — Z6835 Body mass index (BMI) 35.0-35.9, adult: Secondary | ICD-10-CM | POA: Diagnosis not present

## 2016-10-22 DIAGNOSIS — E785 Hyperlipidemia, unspecified: Secondary | ICD-10-CM

## 2016-10-22 DIAGNOSIS — E669 Obesity, unspecified: Secondary | ICD-10-CM | POA: Diagnosis not present

## 2016-10-22 DIAGNOSIS — E559 Vitamin D deficiency, unspecified: Secondary | ICD-10-CM

## 2016-10-22 MED ORDER — VITAMIN D (ERGOCALCIFEROL) 1.25 MG (50000 UNIT) PO CAPS: 50000.0000 [IU] | ORAL_CAPSULE | ORAL | 0 refills | Status: DC

## 2016-10-23 NOTE — Progress Notes (Signed)
Office: [phone]  /  Fax: [phone]   HPI:   Chief Complaint: OBESITY Laura Perez is here to discuss her progress with her obesity treatment plan. She is on the  keep a food journal with 1100 to 1400 calories and 75 grams of protein  and is following her eating plan approximately 100 % of the time. She states she is exercising 0 minutes 0 times per week. Laura Perez has been deviating from plan more. She states hunger and cravings are decreased after Saxenda. Her weight is 217 lb (98.4 kg) today and has gained 1 pound over a period of 2 weeks since her last visit. She has lost 0 lbs since starting treatment with [place].  Vitamin D deficiency Laura Perez has a diagnosis of vitamin D deficiency. She is currently taking vit D and denies nausea, vomiting or muscle weakness.  Mixed Hyperlipidemia Laura Perez has hyperlipidemia, low HDL and elevated triglycerides, number improved with diet and exercise. Laura Perez has been trying to improve her cholesterol levels with intensive lifestyle modification including a low saturated fat diet, exercise and weight loss. She denies any chest pain, claudication or myalgias.  ALLERGIES: No Known Allergies  MEDICATIONS: Current Outpatient Prescriptions on File Prior to Visit  Medication Sig Dispense Refill  . beclomethasone (QVAR) 80 MCG/ACT inhaler Inhale 1 puff into the lungs as needed.    . diclofenac (VOLTAREN) 75 MG EC tablet Take 1 tablet (75 mg total) by mouth 2 (two) times daily. 60 tablet 1  . escitalopram (LEXAPRO) 10 MG tablet Take 10 mg by mouth daily.    . Liraglutide -Weight Management (SAXENDA) 18 MG/3ML SOPN Inject 3 mg into the skin every morning. 3 pen 0  . lisdexamfetamine (VYVANSE) 50 MG capsule Take 50 mg by mouth daily.    . ondansetron (ZOFRAN ODT) 4 MG disintegrating tablet Take one tab by mouth Q6hr prn nausea (Dissolve under tongue) 12 tablet 0  . vortioxetine HBr (TRINTELLIX) 5 MG TABS Take 1 tablet by mouth every morning.     No current  facility-administered medications on file prior to visit.     PAST MEDICAL HISTORY: Past Medical History:  Diagnosis Date  . ADHD   . Anxiety   . Asthma   . Depression   . GERD (gastroesophageal reflux disease)   . IBS (irritable bowel syndrome)   . PCOS (polycystic ovarian syndrome)     PAST SURGICAL HISTORY: No past surgical history on file.  SOCIAL HISTORY: Social History  Substance Use Topics  . Smoking status: Never Smoker  . Smokeless tobacco: Never Used  . Alcohol use No    FAMILY HISTORY: Family History  Problem Relation Age of Onset  . Adopted: Yes    ROS: Review of Systems  Constitutional: Negative for weight loss.  Cardiovascular: Negative for chest pain and claudication.  Gastrointestinal: Negative for nausea and vomiting.  Musculoskeletal: Negative for myalgias.       Negative muscle weakness    PHYSICAL EXAM: Blood pressure 100/67, pulse 90, temperature 98.2 F (36.8 C), temperature source Oral, height 5\' 6"  (1.676 m), weight 217 lb (98.4 kg), SpO2 96 %. Body mass index is 35.02 kg/m. Physical Exam  Constitutional: She is oriented to person, place, and time. She appears well-developed and well-nourished.  Cardiovascular: Normal rate.   Pulmonary/Chest: Effort normal.  Musculoskeletal: Normal range of motion.  Neurological: She is oriented to person, place, and time.  Skin: Skin is warm and dry.  Psychiatric: She has a normal mood and affect. Her behavior is  normal.  Vitals reviewed.   RECENT LABS AND TESTS: BMET    Component Value Date/Time   NA 142 08/19/2016 1407   K 4.8 08/19/2016 1407   CL 101 08/19/2016 1407   CO2 25 08/19/2016 1407   GLUCOSE 81 08/19/2016 1407   BUN 20 08/19/2016 1407   CREATININE 0.76 08/19/2016 1407   CALCIUM 9.6 08/19/2016 1407   GFRNONAA 111 08/19/2016 1407   GFRAA 128 08/19/2016 1407   Lab Results  Component Value Date   HGBA1C 5.2 08/19/2016   HGBA1C 5.4 05/15/2016   Lab Results  Component Value  Date   INSULIN 25.1 (H) 08/19/2016   INSULIN 30.2 (H) 05/15/2016   CBC    Component Value Date/Time   WBC 8.9 05/15/2016 1156   RBC 4.52 05/15/2016 1156   HCT 38.4 05/15/2016 1156   MCV 85 05/15/2016 1156   MCH 27.7 05/15/2016 1156   MCHC 32.6 05/15/2016 1156   RDW 14.1 05/15/2016 1156   LYMPHSABS 2.9 05/15/2016 1156   EOSABS 0.1 05/15/2016 1156   BASOSABS 0.1 05/15/2016 1156   Iron/TIBC/Ferritin/ %Sat No results found for: IRON, TIBC, FERRITIN, IRONPCTSAT Lipid Panel     Component Value Date/Time   CHOL 125 08/19/2016 1407   TRIG 220 (H) 08/19/2016 1407   HDL 33 (L) 08/19/2016 1407   LDLCALC 48 08/19/2016 1407   Hepatic Function Panel     Component Value Date/Time   PROT 7.5 08/19/2016 1407   ALBUMIN 4.3 08/19/2016 1407   AST 19 08/19/2016 1407   ALT 21 08/19/2016 1407   ALKPHOS 97 08/19/2016 1407   BILITOT 0.4 08/19/2016 1407      Component Value Date/Time   TSH 1.330 05/15/2016 1156    ASSESSMENT AND PLAN: Vitamin D deficiency - Plan: Vitamin D, Ergocalciferol, (DRISDOL) 50000 units CAPS capsule  Hyperlipidemia, unspecified hyperlipidemia type  Class 2 obesity without serious comorbidity with body mass index (BMI) of 35.0 to 35.9 in adult, unspecified obesity type  PLAN:  Vitamin D Deficiency Laura Perez was informed that low vitamin D levels contributes to fatigue and are associated with obesity, breast, and colon cancer. She agrees to continue to take prescription Vit D @50 ,000 IU every week, we will refill for 1 month and will follow up for routine testing of vitamin D, at least 2-3 times per year. She was informed of the risk of over-replacement of vitamin D and agrees to not increase her dose unless he discusses this with us first. Laura Perez agrees to follow up with our clinic in 2 weeks.  Mixed Hyperlipidemia Laura Perez was informed of the American Heart Association Guidelines emphasizing intensive lifestyle modifications as the first line treatment for  hyperlipidemia. We discussed many lifestyle modifications today in depth, and Laura Perez will continue to work on decreasing saturated fats such as fatty red meat, butter and many fried foods. She will also increase vegetables and lean protein in her diet and continue to work on exercise and weight loss efforts.  Obesity Laura Perez may not currently be in the action stage of change. Her goal is to continue with weight loss efforts but I am unsure if she is ready to make the needed changes. She has agreed to keep a food journal with 1100 to 1400 calories and 75 grams of protein  Laura Perez has been instructed to work up to a goal of 150 minutes of combined cardio and strengthening exercise per week for weight loss and overall health benefits. We discussed the following Behavioral Modification Strategies today: increasing  H2O, increasing lean protein intake and decreasing simple carbohydrates  We discussed various medication options to help Laura Perez with her weight loss efforts and we both agreed to continue to take Saxenda and Laura Perez will follow up with our clinic in 2 weeks.  Laura Perez has agreed to follow up with our clinic in 2 weeks. She was informed of the importance of frequent follow up visits to maximize her success with intensive lifestyle modifications for her multiple health conditions.  I, Nevada Crane, am acting as scribe for Quillian Quince, MD  I have reviewed the above documentation for accuracy and completeness, and I agree with the above. -Quillian Quince, MD  OBESITY BEHAVIORAL INTERVENTION VISIT  Today's visit was # 10 out of 22.  Starting weight: 217 lbs Starting date: 05/13/16 Today's weight : 217 lbs Today's date: 10/22/2016 Total lbs lost to date: 0 (Patients must lose 7 lbs in the first 6 months to continue with counseling)   ASK: We discussed the diagnosis of obesity with Laura Perez today and Laura Perez agreed to give [place] permission to discuss obesity behavioral modification  therapy today.  ASSESS: Laura Perez has the diagnosis of obesity and her BMI today is @TBMI @ Laura Perez is in the action stage of change (maybe)  ADVISE: Jamaris was educated on the multiple health risks of obesity as well as the benefit of weight loss to improve her health. She was advised of the need for long term treatment and the importance of lifestyle modifications.  AGREE: Multiple dietary modification options and treatment options were discussed and  Reginia agreed to keep a food journal with 1100 to 1400 calories and 75 grams of protein  We discussed the following Behavioral Modification Strategies today: increasing H2O, increasing lean protein intake and decreasing simple carbohydrates

## 2016-11-05 ENCOUNTER — Ambulatory Visit (INDEPENDENT_AMBULATORY_CARE_PROVIDER_SITE_OTHER): Payer: 59 | Admitting: Family Medicine

## 2016-11-05 VITALS — BP 96/66 | HR 94 | Temp 98.8°F | Ht 66.0 in | Wt 217.0 lb

## 2016-11-05 DIAGNOSIS — E669 Obesity, unspecified: Secondary | ICD-10-CM

## 2016-11-05 DIAGNOSIS — Z9189 Other specified personal risk factors, not elsewhere classified: Secondary | ICD-10-CM

## 2016-11-05 DIAGNOSIS — Z6835 Body mass index (BMI) 35.0-35.9, adult: Secondary | ICD-10-CM

## 2016-11-05 DIAGNOSIS — E559 Vitamin D deficiency, unspecified: Secondary | ICD-10-CM

## 2016-11-05 MED ORDER — VITAMIN D (ERGOCALCIFEROL) 1.25 MG (50000 UNIT) PO CAPS: 50000.0000 [IU] | ORAL_CAPSULE | ORAL | 0 refills | Status: DC

## 2016-11-05 NOTE — Progress Notes (Signed)
Office: [phone]  /  Fax: [phone]   HPI:   Chief Complaint: OBESITY Laura Perez is here to discuss her progress with her obesity treatment plan. She is on the  keep a food journal with 1100 to 1400 calories and 75 grams of protein  and is following her eating plan approximately 100 % of the time. 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 Adrean is maintaining weight well. She has more GI upset with Saxenda but has increased calories and carbohydrates. Her weight is 217 lb (98.4 kg) today and has maintained weight over a period of 2 weeks since her last visit. She has lost 0 lbs since starting treatment with [place].  Vitamin D deficiency Laura Perez has a diagnosis of vitamin D deficiency. She is currently stable on vit D, not yet at goal and denies vomiting or muscle weakness.  At Risk for Osteopenia Laura Perez is at higher risk of osteopenia and osteoporosis due to vitamin D deficiency.    ALLERGIES: No Known Allergies  MEDICATIONS: Current Outpatient Prescriptions on File Prior to Visit  Medication Sig Dispense Refill  . beclomethasone (QVAR) 80 MCG/ACT inhaler Inhale 1 puff into the lungs as needed.    . diclofenac (VOLTAREN) 75 MG EC tablet Take 1 tablet (75 mg total) by mouth 2 (two) times daily. 60 tablet 1  . escitalopram (LEXAPRO) 10 MG tablet Take 10 mg by mouth daily.    Marland Kitchen lisdexamfetamine (VYVANSE) 50 MG capsule Take 50 mg by mouth daily.    . ondansetron (ZOFRAN ODT) 4 MG disintegrating tablet Take one tab by mouth Q6hr prn nausea (Dissolve under tongue) 12 tablet 0  . vortioxetine HBr (TRINTELLIX) 10 MG TABS Take 1 tablet by mouth every morning.     No current facility-administered medications on file prior to visit.     PAST MEDICAL HISTORY: Past Medical History:  Diagnosis Date  . ADHD   . Anxiety   . Asthma   . Depression   . GERD (gastroesophageal reflux disease)   . IBS (irritable bowel syndrome)   . PCOS (polycystic ovarian syndrome)      PAST SURGICAL HISTORY: No past surgical history on file.  SOCIAL HISTORY: Social History  Substance Use Topics  . Smoking status: Never Smoker  . Smokeless tobacco: Never Used  . Alcohol use No    FAMILY HISTORY: Family History  Problem Relation Age of Onset  . Adopted: Yes    ROS: Review of Systems  Constitutional: Negative for weight loss.  Gastrointestinal: Positive for diarrhea and nausea. Negative for vomiting.  Musculoskeletal:       Negative muscle weakness    PHYSICAL EXAM: Blood pressure 96/66, pulse 94, temperature 98.8 F (37.1 C), temperature source Oral, height 5\' 6"  (1.676 m), weight 217 lb (98.4 kg), SpO2 96 %. Body mass index is 35.02 kg/m. Physical Exam  Constitutional: She is oriented to person, place, and time. She appears well-developed and well-nourished.  Cardiovascular: Normal rate.   Pulmonary/Chest: Effort normal.  Musculoskeletal: Normal range of motion.  Neurological: She is oriented to person, place, and time.  Skin: Skin is warm and dry.  Psychiatric: She has a normal mood and affect. Her behavior is normal.  Vitals reviewed.   RECENT LABS AND TESTS: BMET    Component Value Date/Time   NA 142 08/19/2016 1407   K 4.8 08/19/2016 1407   CL 101 08/19/2016 1407   CO2 25 08/19/2016 1407   GLUCOSE 81 08/19/2016 1407   BUN 20  08/19/2016 1407   CREATININE 0.76 08/19/2016 1407   CALCIUM 9.6 08/19/2016 1407   GFRNONAA 111 08/19/2016 1407   GFRAA 128 08/19/2016 1407   Lab Results  Component Value Date   HGBA1C 5.2 08/19/2016   HGBA1C 5.4 05/15/2016   Lab Results  Component Value Date   INSULIN 25.1 (H) 08/19/2016   INSULIN 30.2 (H) 05/15/2016   CBC    Component Value Date/Time   WBC 8.9 05/15/2016 1156   RBC 4.52 05/15/2016 1156   HGB 12.5 05/15/2016 1156   HCT 38.4 05/15/2016 1156   MCV 85 05/15/2016 1156   MCH 27.7 05/15/2016 1156   MCHC 32.6 05/15/2016 1156   RDW 14.1 05/15/2016 1156   LYMPHSABS 2.9 05/15/2016 1156    EOSABS 0.1 05/15/2016 1156   BASOSABS 0.1 05/15/2016 1156   Iron/TIBC/Ferritin/ %Sat No results found for: IRON, TIBC, FERRITIN, IRONPCTSAT Lipid Panel     Component Value Date/Time   CHOL 125 08/19/2016 1407   TRIG 220 (H) 08/19/2016 1407   HDL 33 (L) 08/19/2016 1407   LDLCALC 48 08/19/2016 1407   Hepatic Function Panel     Component Value Date/Time   PROT 7.5 08/19/2016 1407   ALBUMIN 4.3 08/19/2016 1407   AST 19 08/19/2016 1407   ALT 21 08/19/2016 1407   ALKPHOS 97 08/19/2016 1407   BILITOT 0.4 08/19/2016 1407      Component Value Date/Time   TSH 1.330 05/15/2016 1156    ASSESSMENT AND PLAN: Vitamin D deficiency - Plan: Vitamin D, Ergocalciferol, (DRISDOL) 50000 units CAPS capsule  At risk for osteopenia  Class 2 obesity without serious comorbidity with body mass index (BMI) of 35.0 to 35.9 in adult, unspecified obesity type  PLAN:  Vitamin D Deficiency Laura Perez was informed that low vitamin D levels contributes to fatigue and are associated with obesity, breast, and colon cancer. She agrees to continue to take prescription Vit D @50 ,000 IU every week, we will refill for 1 month and we will re-check labs at next visit and will follow up for routine testing of vitamin D, at least 2-3 times per year. She was informed of the risk of over-replacement of vitamin D and agrees to not increase her dose unless he discusses this with us first.  At risk for osteopenia Laura Perez is at risk for osteopenia and osteoporsis due to her vitamin D deficiency. She was encouraged to take her vitamin D and follow her higher calcium diet and increase strengthening exercise to help strengthen her bones and decrease her risk of osteopenia and osteoporosis.  Obesity Laura Perez is currently in the action stage of change. As such, her goal is to continue with weight loss efforts She has agreed to change to follow the Category 2 plan Laura Perez has been instructed to work up to a goal of 150 minutes  of combined cardio and strengthening exercise per week for weight loss and overall health benefits. We discussed the following Behavioral Modification Strategies today: decreasing simple carbohydrates and meal planning & cooking strategies We discussed various medication options to help Laura Perez with her weight loss efforts and we both agreed to continue Saxenda at 0.6 mg and will follow up with our clinic in 3 weeks.  Laura Perez has agreed to follow up with our clinic in 3 weeks. She was informed of the importance of frequent follow up visits to maximize her success with intensive lifestyle modifications for her multiple health conditions.  Laura Perez Laura Perez, Laura Perez, am acting as transcriptionist for Quillian Quincearen Ziza Hastings, MD  I have reviewed the above documentation for accuracy and completeness, and I agree with the above. -Quillian Quince, MD  OBESITY BEHAVIORAL INTERVENTION VISIT  Today's visit was # 11 out of 22.  Starting weight: 217 lbs Starting date: 05/13/16 Today's weight : 217 lbs Today's date: 11/05/2016 Total lbs lost to date: 0 (Patients must lose 7 lbs in the first 6 months to continue with counseling)   ASK: We discussed the diagnosis of obesity with Einar Crow today and Yanique agreed to give [place] permission to discuss obesity behavioral modification therapy today.  ASSESS: Glynn has the diagnosis of obesity and her BMI today is 35.1 Jocelyn is in the action stage of change   ADVISE: Teyanna was educated on the multiple health risks of obesity as well as the benefit of weight loss to improve her health. She was advised of the need for long term treatment and the importance of lifestyle modifications.  AGREE: Multiple dietary modification options and treatment options were discussed and  Carletta agreed to change to follow the Category 2 plan We discussed the following Behavioral Modification Strategies today: decreasing simple carbohydrates and meal planning & cooking  strategies

## 2016-11-20 ENCOUNTER — Ambulatory Visit (INDEPENDENT_AMBULATORY_CARE_PROVIDER_SITE_OTHER): Payer: 59 | Admitting: Family Medicine

## 2016-11-24 ENCOUNTER — Ambulatory Visit (INDEPENDENT_AMBULATORY_CARE_PROVIDER_SITE_OTHER): Payer: 59 | Admitting: Family Medicine

## 2016-11-24 MED FILL — VIT D2 1.25 MG (50,000 UNIT: 1.25 MG | 28 days supply | Qty: 4 | Fill #0

## 2016-11-25 MED FILL — TRINTELLIX 10 MG TABLET: 10 | 30 days supply | Qty: 30 | Fill #0

## 2016-12-01 ENCOUNTER — Ambulatory Visit (INDEPENDENT_AMBULATORY_CARE_PROVIDER_SITE_OTHER): Payer: 59 | Admitting: Physician Assistant

## 2016-12-01 VITALS — BP 106/71 | HR 81 | Temp 97.6°F | Ht 66.0 in | Wt 218.0 lb

## 2016-12-01 DIAGNOSIS — Z9189 Other specified personal risk factors, not elsewhere classified: Secondary | ICD-10-CM | POA: Diagnosis not present

## 2016-12-01 DIAGNOSIS — E559 Vitamin D deficiency, unspecified: Secondary | ICD-10-CM

## 2016-12-01 DIAGNOSIS — Z6835 Body mass index (BMI) 35.0-35.9, adult: Secondary | ICD-10-CM

## 2016-12-01 DIAGNOSIS — E8881 Metabolic syndrome: Secondary | ICD-10-CM | POA: Diagnosis not present

## 2016-12-01 DIAGNOSIS — IMO0001 Reserved for inherently not codable concepts without codable children: Secondary | ICD-10-CM

## 2016-12-01 DIAGNOSIS — E782 Mixed hyperlipidemia: Secondary | ICD-10-CM

## 2016-12-01 DIAGNOSIS — E669 Obesity, unspecified: Secondary | ICD-10-CM

## 2016-12-01 NOTE — Progress Notes (Signed)
Office: [phone]  /  Fax: [phone]   HPI:   Chief Complaint: OBESITY Laura Perez is here to discuss her progress with her obesity treatment plan. She is on the  follow the Category 2 plan and is following her eating plan approximately 90 % of the time. She states she is walking 20 minutes 2 times per week. Laura Perez is struggling to eat all the food on the plan and states has been more hungry.  She has been working on getting chores around the house completed, and has been snacking more.  Her weight is 218 lb (98.9 kg) today and has had 1 pound of weight gain since her last visit. She has lost 0 lbs since starting treatment with [place].  Vitamin D deficiency Laura Perez has a diagnosis of vitamin D deficiency. She is currently taking vit D and denies nausea, vomiting or muscle weakness.  Hyperlipidemia Laura Perez has hyperlipidemia and has been trying to improve her cholesterol levels with intensive lifestyle modification including a low saturated fat diet, exercise and weight loss. She denies any chest pain, claudication or myalgias.  Insulin Resistance Laura Perez has a diagnosis of insulin resistance based on her elevated fasting insulin Perez >5. Although Laura Perez's blood glucose readings are still under good control, insulin resistance puts her at greater risk of metabolic syndrome and diabetes. She admits to polyphagia.  She is not taking metformin currently and continues to work on diet and exercise to decrease risk of diabetes.  At risk for diabetes Laura Perez is at higher than averagerisk for developing diabetes due to her obesity. She currently denies polyuria or polydipsia.  ALLERGIES: No Known Allergies  MEDICATIONS: Current Outpatient Prescriptions on File Prior to Visit  Medication Sig Dispense Refill  . beclomethasone (QVAR) 80 MCG/ACT inhaler Inhale 1 puff into the lungs as needed.    . diclofenac (VOLTAREN) 75 MG EC tablet Take 1 tablet (75 mg total) by mouth 2 (two) times daily. 60  tablet 1  . escitalopram (LEXAPRO) 10 MG tablet Take 10 mg by mouth daily.    Marland Kitchen lisdexamfetamine (VYVANSE) 50 MG capsule Take 50 mg by mouth daily.    . ondansetron (ZOFRAN ODT) 4 MG disintegrating tablet Take one tab by mouth Q6hr prn nausea (Dissolve under tongue) 12 tablet 0  . Vitamin D, Ergocalciferol, (DRISDOL) 50000 units CAPS capsule Take 1 capsule (50,000 Units total) by mouth every 7 (seven) days. 4 capsule 0  . vortioxetine HBr (TRINTELLIX) 10 MG TABS Take 1 tablet by mouth every morning.     No current facility-administered medications on file prior to visit.     PAST MEDICAL HISTORY: Past Medical History:  Diagnosis Date  . ADHD   . Anxiety   . Asthma   . Depression   . GERD (gastroesophageal reflux disease)   . IBS (irritable bowel syndrome)   . PCOS (polycystic ovarian syndrome)     PAST SURGICAL HISTORY: No past surgical history on file.  SOCIAL HISTORY: Social History  Substance Use Topics  . Smoking status: Never Smoker  . Smokeless tobacco: Never Used  . Alcohol use No    FAMILY HISTORY: Family History  Problem Relation Age of Onset  . Adopted: Yes    ROS: Review of Systems  Cardiovascular: Negative for chest pain and claudication.  Gastrointestinal: Negative for nausea and vomiting.  Musculoskeletal: Negative for myalgias.       Negative muscle weakness  Endo/Heme/Allergies:       Positive for polyphagia    PHYSICAL EXAM: Blood  pressure 106/71, pulse 81, temperature 97.6 F (36.4 C), temperature source Oral, height 5\' 6"  (1.676 m), weight 218 lb (98.9 kg), SpO2 94 %. Body mass index is 35.19 kg/m. Physical Exam  Constitutional: She is oriented to person, place, and time. She appears well-developed and well-nourished.  Cardiovascular: Normal rate.   Pulmonary/Chest: Effort normal.  Musculoskeletal: Normal range of motion.  Neurological: She is alert and oriented to person, place, and time.  Skin: Skin is warm and dry.    RECENT LABS  AND TESTS: BMET    Component Value Date/Time   NA 142 08/19/2016 1407   K 4.8 08/19/2016 1407   CL 101 08/19/2016 1407   CO2 25 08/19/2016 1407   GLUCOSE 81 08/19/2016 1407   BUN 20 08/19/2016 1407   CREATININE 0.76 08/19/2016 1407   CALCIUM 9.6 08/19/2016 1407   GFRNONAA 111 08/19/2016 1407   GFRAA 128 08/19/2016 1407   Lab Results  Component Value Date   HGBA1C 5.2 08/19/2016   HGBA1C 5.4 05/15/2016   Lab Results  Component Value Date   INSULIN 25.1 (H) 08/19/2016   INSULIN 30.2 (H) 05/15/2016   CBC    Component Value Date/Time   WBC 8.9 05/15/2016 1156   RBC 4.52 05/15/2016 1156   HGB 12.5 05/15/2016 1156   HCT 38.4 05/15/2016 1156   MCV 85 05/15/2016 1156   MCH 27.7 05/15/2016 1156   MCHC 32.6 05/15/2016 1156   RDW 14.1 05/15/2016 1156   LYMPHSABS 2.9 05/15/2016 1156   EOSABS 0.1 05/15/2016 1156   BASOSABS 0.1 05/15/2016 1156   Iron/TIBC/Ferritin/ %Sat No results found for: IRON, TIBC, FERRITIN, IRONPCTSAT Lipid Panel     Component Value Date/Time   CHOL 125 08/19/2016 1407   TRIG 220 (H) 08/19/2016 1407   HDL 33 (L) 08/19/2016 1407   LDLCALC 48 08/19/2016 1407   Hepatic Function Panel     Component Value Date/Time   PROT 7.5 08/19/2016 1407   ALBUMIN 4.3 08/19/2016 1407   AST 19 08/19/2016 1407   ALT 21 08/19/2016 1407   ALKPHOS 97 08/19/2016 1407   BILITOT 0.4 08/19/2016 1407      Component Value Date/Time   TSH 1.330 05/15/2016 1156    ASSESSMENT AND PLAN: Vitamin D deficiency - Plan: VITAMIN D 25 Hydroxy (Vit-D Deficiency, Fractures)  Mixed hyperlipidemia - Plan: Comprehensive metabolic panel, Lipid Panel With LDL/HDL Ratio  Insulin resistance - Plan: Hemoglobin A1c, Insulin, random  At risk for diabetes mellitus  Class 2 obesity with serious comorbidity and body mass index (BMI) of 35.0 to 35.9 in adult, unspecified obesity type  PLAN:  Vitamin D Deficiency Laura Perez was informed that low vitamin D levels contributes to fatigue and  are associated with obesity, breast, and colon cancer. She agrees to continue to take prescription Vit D @50 ,000 IU every week and will recheck Vit D levels today. She was informed of the risk of over-replacement of vitamin D and agrees to not increase her dose unless he discusses this with [place] first.  Hyperlipidemia Laura Perez was informed of the American Heart Association Guidelines emphasizing intensive lifestyle modifications as the first line treatment for hyperlipidemia. We discussed many lifestyle modifications today in depth, and Yalonda will continue to work on decreasing saturated fats such as fatty red meat, butter and many fried foods. She will also increase vegetables and lean protein in her diet and continue to work on exercise and weight loss efforts. Will recheck labs today.  Insulin Resistance Laura Perez will continue to  work on weight loss, exercise, and decreasing simple carbohydrates in her diet to help decrease the risk of diabetes. We dicussed metformin including benefits and risks. She was informed that eating too many simple carbohydrates or too many calories at one sitting increases the likelihood of GI side effects. Laura Perez declined metformin for now and prescription was not written today. Laura Perez agreed to follow up with us as directed to monitor her progress. Will recheck labs today.  Diabetes risk counselling Laura Perez was given extended (at least 15 minutes) diabetes prevention counseling today. She is 26 y.o. female and has risk factors for diabetes including obesity. We discussed intensive lifestyle modifications today with an emphasis on weight loss as well as increasing exercise and decreasing simple carbohydrates in her diet.  Obesity  Laura Perez is currently in the action stage of change. As such, her goal is to continue with weight loss efforts She has agreed to follow the Category 2 plan Laura Perez has been instructed to work up to a goal of 150 minutes of combined cardio and  strengthening exercise per week for weight loss and overall health benefits. We discussed the following Behavioral Modification Stratagies today: increasing lean protein intake and decrease snacking    Laura Perez has agreed to follow up with our clinic in 3 weeks. She was informed of the importance of frequent follow up visits to maximize her success with intensive lifestyle modifications for her multiple health conditions.  Laura LevelSahar Osman Mcdowell Arh Perez  I have reviewed the above documentation for accuracy and completeness, and I agree with the above. -Quillian Quincearen Teesha Ohm, MD

## 2016-12-02 LAB — COMPREHENSIVE METABOLIC PANEL
A/G RATIO: 1.5 (ref 1.2–2.2)
ALBUMIN: 4.6 g/dL (ref 3.5–5.5)
ALT: 23 IU/L (ref 0–32)
AST: 20 IU/L (ref 0–40)
Alkaline Phosphatase: 106 IU/L (ref 39–117)
BILIRUBIN TOTAL: 0.3 mg/dL (ref 0.0–1.2)
BUN / CREAT RATIO: 12 (ref 9–23)
BUN: 10 mg/dL (ref 6–20)
CALCIUM: 9.6 mg/dL (ref 8.7–10.2)
CO2: 23 mmol/L (ref 20–29)
Chloride: 104 mmol/L (ref 96–106)
Creatinine, Ser: 0.86 mg/dL (ref 0.57–1.00)
GFR, EST AFRICAN AMERICAN: 109 mL/min/{1.73_m2} (ref >59)
GFR, EST NON AFRICAN AMERICAN: 95 mL/min/{1.73_m2} (ref >59)
GLOBULIN, TOTAL: 3 g/dL (ref 1.5–4.5)
Glucose: 80 mg/dL (ref 65–99)
POTASSIUM: 4.6 mmol/L (ref 3.5–5.2)
SODIUM: 140 mmol/L (ref 134–144)
TOTAL PROTEIN: 7.6 g/dL (ref 6.0–8.5)

## 2016-12-02 LAB — LIPID PANEL WITH LDL/HDL RATIO
Cholesterol, Total: 112 mg/dL (ref 100–199)
HDL: 33 mg/dL — AB (ref >39)
LDL CALC: 52 mg/dL (ref 0–99)
LDL/HDL RATIO: 1.6 ratio (ref 0.0–3.2)
TRIGLYCERIDES: 136 mg/dL (ref 0–149)
VLDL CHOLESTEROL CAL: 27 mg/dL (ref 5–40)

## 2016-12-02 LAB — VITAMIN D 25 HYDROXY (VIT D DEFICIENCY, FRACTURES): Vit D, 25-Hydroxy: 36.9 ng/mL (ref 30.0–100.0)

## 2016-12-02 LAB — HEMOGLOBIN A1C
Est. average glucose Bld gHb Est-mCnc: 103 mg/dL
Hgb A1c MFr Bld: 5.2 % (ref 4.8–5.6)

## 2016-12-02 LAB — INSULIN, RANDOM: INSULIN: 24.6 u[IU]/mL (ref 2.6–24.9)

## 2016-12-11 ENCOUNTER — Ambulatory Visit (INDEPENDENT_AMBULATORY_CARE_PROVIDER_SITE_OTHER): Payer: 59 | Admitting: Clinical

## 2016-12-11 DIAGNOSIS — F411 Generalized anxiety disorder: Secondary | ICD-10-CM

## 2016-12-15 ENCOUNTER — Ambulatory Visit (INDEPENDENT_AMBULATORY_CARE_PROVIDER_SITE_OTHER): Payer: 59 | Admitting: Physician Assistant

## 2016-12-22 ENCOUNTER — Ambulatory Visit (INDEPENDENT_AMBULATORY_CARE_PROVIDER_SITE_OTHER): Payer: 59 | Admitting: Physician Assistant

## 2016-12-22 VITALS — BP 108/73 | HR 84 | Temp 98.7°F | Ht 66.0 in | Wt 219.0 lb

## 2016-12-22 DIAGNOSIS — Z9189 Other specified personal risk factors, not elsewhere classified: Secondary | ICD-10-CM

## 2016-12-22 DIAGNOSIS — E785 Hyperlipidemia, unspecified: Secondary | ICD-10-CM

## 2016-12-22 DIAGNOSIS — IMO0001 Reserved for inherently not codable concepts without codable children: Secondary | ICD-10-CM

## 2016-12-22 DIAGNOSIS — E559 Vitamin D deficiency, unspecified: Secondary | ICD-10-CM

## 2016-12-22 DIAGNOSIS — E669 Obesity, unspecified: Secondary | ICD-10-CM | POA: Diagnosis not present

## 2016-12-22 DIAGNOSIS — Z6835 Body mass index (BMI) 35.0-35.9, adult: Secondary | ICD-10-CM

## 2016-12-22 MED ORDER — VITAMIN D (ERGOCALCIFEROL) 1.25 MG (50000 UNIT) PO CAPS: 50000.0000 [IU] | ORAL_CAPSULE | ORAL | 0 refills | Status: DC

## 2016-12-22 MED FILL — VIT D2 1.25 MG (50,000 UNIT: 1.25 MG | 28 days supply | Qty: 4 | Fill #0

## 2016-12-22 NOTE — Progress Notes (Signed)
Office: [phone]  /  Fax: [phone]   HPI:   Chief Complaint: OBESITY Laura Perez is here to discuss her progress with her obesity treatment plan. She is on the  follow the Category 2 plan and is following her eating plan approximately 100 % of the time. She states she is walking 4 times per week. [name]   Her weight is 219 lb (99.3 kg) today and gained 1 pound since her last visit. She has gained 2 pounds since starting treatment with us.  Vitamin D deficiency Laura Perez has a diagnosis of vitamin D deficiency. She is currently taking vit D and denies nausea, vomiting or muscle weakness.  Hyperlipidemia Laura Perez has hyperlipidemia and has been trying to improve her cholesterol levels with intensive lifestyle modification including a low saturated fat diet, exercise and weight loss. She denies any chest pain, claudication or myalgias.  At risk for osteopenia Laura Perez is at higher risk of osteopenia and osteoporosis due to vitamin D deficiency.    ALLERGIES: No Known Allergies  MEDICATIONS: Current Outpatient Prescriptions on File Prior to Visit  Medication Sig Dispense Refill  . beclomethasone (QVAR) 80 MCG/ACT inhaler Inhale 1 puff into the lungs as needed.    . diclofenac (VOLTAREN) 75 MG EC tablet Take 1 tablet (75 mg total) by mouth 2 (two) times daily. 60 tablet 1  . escitalopram (LEXAPRO) 10 MG tablet Take 10 mg by mouth daily.    Marland Kitchen. lisdexamfetamine (VYVANSE) 50 MG capsule Take 50 mg by mouth daily.    . ondansetron (ZOFRAN ODT) 4 MG disintegrating tablet Take one tab by mouth Q6hr prn nausea (Dissolve under tongue) 12 tablet 0  . vortioxetine HBr (TRINTELLIX) 10 MG TABS Take 1 tablet by mouth every morning.     No current facility-administered medications on file prior to visit.     PAST MEDICAL HISTORY: Past Medical History:  Diagnosis Date  . ADHD   . Anxiety   . Asthma   . Depression   . GERD (gastroesophageal reflux disease)   . IBS (irritable bowel syndrome)    . PCOS (polycystic ovarian syndrome)     PAST SURGICAL HISTORY: No past surgical history on file.  SOCIAL HISTORY: Social History  Substance Use Topics  . Smoking status: Never Smoker  . Smokeless tobacco: Never Used  . Alcohol use No    FAMILY HISTORY: Family History  Problem Relation Age of Onset  . Adopted: Yes    ROS: Review of Systems  Cardiovascular: Negative for chest pain and claudication.  Gastrointestinal: Negative for nausea and vomiting.  Musculoskeletal: Negative for myalgias.       Negative muscle weakness    PHYSICAL EXAM: Blood pressure 108/73, pulse 84, temperature 98.7 F (37.1 C), temperature source Oral, height 5\' 6"  (1.676 m), weight 219 lb (99.3 kg), SpO2 95 %. Body mass index is 35.35 kg/m. Physical Exam  Constitutional: She is oriented to person, place, and time. She appears well-developed and well-nourished.  Cardiovascular: Normal rate.   Pulmonary/Chest: Effort normal.  Musculoskeletal: Normal range of motion.  Neurological: She is alert and oriented to person, place, and time.  Skin: Skin is warm and dry.  Psychiatric: She has a normal mood and affect.    RECENT LABS AND TESTS: BMET    Component Value Date/Time   NA 140 12/01/2016 1051   K 4.6 12/01/2016 1051   CL 104 12/01/2016 1051   CO2 23 12/01/2016 1051   GLUCOSE 80 12/01/2016 1051   BUN 10 12/01/2016 1051  CREATININE 0.86 12/01/2016 1051   CALCIUM 9.6 12/01/2016 1051   GFRNONAA 95 12/01/2016 1051   GFRAA 109 12/01/2016 1051   Lab Results  Component Value Date   HGBA1C 5.2 12/01/2016   HGBA1C 5.2 08/19/2016   HGBA1C 5.4 05/15/2016   Lab Results  Component Value Date   INSULIN 24.6 12/01/2016   INSULIN 25.1 (H) 08/19/2016   INSULIN 30.2 (H) 05/15/2016   CBC    Component Value Date/Time   WBC 8.9 05/15/2016 1156   RBC 4.52 05/15/2016 1156   HGB 12.5 05/15/2016 1156   HCT 38.4 05/15/2016 1156   MCV 85 05/15/2016 1156   MCH 27.7 05/15/2016 1156   MCHC 32.6  05/15/2016 1156   RDW 14.1 05/15/2016 1156   LYMPHSABS 2.9 05/15/2016 1156   EOSABS 0.1 05/15/2016 1156   BASOSABS 0.1 05/15/2016 1156   Iron/TIBC/Ferritin/ %Sat No results found for: IRON, TIBC, FERRITIN, IRONPCTSAT Lipid Panel     Component Value Date/Time   CHOL 112 12/01/2016 1051   TRIG 136 12/01/2016 1051   HDL 33 (L) 12/01/2016 1051   LDLCALC 52 12/01/2016 1051   Hepatic Function Panel     Component Value Date/Time   PROT 7.6 12/01/2016 1051   ALBUMIN 4.6 12/01/2016 1051   AST 20 12/01/2016 1051   ALT 23 12/01/2016 1051   ALKPHOS 106 12/01/2016 1051   BILITOT 0.3 12/01/2016 1051      Component Value Date/Time   TSH 1.330 05/15/2016 1156    ASSESSMENT AND PLAN: Vitamin D deficiency - Plan: Vitamin D, Ergocalciferol, (DRISDOL) 50000 units CAPS capsule  Hyperlipidemia, unspecified hyperlipidemia type  At risk for osteoporosis  Class 2 obesity with serious comorbidity and body mass index (BMI) of 35.0 to 35.9 in adult, unspecified obesity type  PLAN:  Vitamin D Deficiency Laura Perez was informed that low vitamin D levels contributes to fatigue and are associated with obesity, breast, and colon cancer. She agrees to continue to take prescription Vit D @50 ,000 IU every week, refill was written today, and will follow up for routine testing of vitamin D, at least 2-3 times per year. She was informed of the risk of over-replacement of vitamin D and agrees to not increase her dose unless he discusses this with [place] first.  Hyperlipidemia Laura Perez was informed of the American Heart Association Guidelines emphasizing intensive lifestyle modifications as the first line treatment for hyperlipidemia. We discussed many lifestyle modifications today in depth, and Laura Perez will continue to work on decreasing saturated fats such as fatty red meat, butter and many fried foods. She will also increase vegetables and lean protein in her diet and continue to work on exercise and weight loss  efforts.  At risk for osteopenia Laura Perez is at risk for osteopenia and osteoporsis due to her vitamin D deficiency. She was encouraged to take her vitamin D and follow her higher calcium diet and increase strengthening exercise to help strengthen her bones and decrease her risk of osteopenia and osteoporosis.  Obesity Deasiah is currently in the action stage of change. As such, her goal is to continue with weight loss efforts She has agreed to follow the Category 2 plan Trinika has been instructed to work up to a goal of 150 minutes of combined cardio and strengthening exercise per week for weight loss and overall health benefits. We discussed the following Behavioral Modification Stratagies today: increasing lean protein intake and work on meal planning and easy cooking plans and keeping healthy foods in the home.   Haydyn  has agreed to follow up with our clinic in 2 weeks. She was informed of the importance of frequent follow up visits to maximize her success with intensive lifestyle modifications for her multiple health conditions.   Office: [phone]  /  Fax: [phone]  OBESITY BEHAVIORAL INTERVENTION VISIT  Today's visit was # 12 out of 22.  Starting weight: 217 Starting date: 05/13/2016 Today's weight : Weight: 219 lb (99.3 kg)  Today's date: 12/22/2016 Total lbs lost to date: Gained 2 lbs (Patients must lose 7 lbs in the first 6 months to continue with counseling)   ASK: We discussed the diagnosis of obesity with Einar CrowNichole Satcher today and Karen agreed to give us permission to discuss obesity behavioral modification therapy today.  ASSESS: Gail has the diagnosis of obesity and her BMI today is 35.4 Dinita is in the action stage of change   ADVISE: Laura Perez was educated on the multiple health risks of obesity as well as the benefit of weight loss to improve her health. She was advised of the need for long term treatment and the importance of lifestyle  modifications.  AGREE: Multiple dietary modification options and treatment options were discussed and  Shawnee agreed to follow the Category 2 plan We discussed the following Behavioral Modification Stratagies today: increasing lean protein intake and work on meal planning and easy cooking plans and keeping healthy foods in the home.     I have reviewed the above documentation for accuracy and completeness, and I agree with the above. -Illa LevelSahar Osman, PA-C  I have reviewed the above note and agree with the plan. -Quillian Quincearen Edina Winningham, MD

## 2016-12-30 DIAGNOSIS — F902 Attention-deficit hyperactivity disorder, combined type: Secondary | ICD-10-CM | POA: Diagnosis not present

## 2016-12-31 MED FILL — clonazePAM 0.5 MG TABS: 0.5 | 30 days supply | Qty: 60 | Fill #0

## 2017-01-01 ENCOUNTER — Ambulatory Visit (INDEPENDENT_AMBULATORY_CARE_PROVIDER_SITE_OTHER): Payer: 59 | Admitting: Clinical

## 2017-01-01 DIAGNOSIS — F411 Generalized anxiety disorder: Secondary | ICD-10-CM | POA: Diagnosis not present

## 2017-01-05 ENCOUNTER — Ambulatory Visit (INDEPENDENT_AMBULATORY_CARE_PROVIDER_SITE_OTHER): Payer: 59 | Admitting: Physician Assistant

## 2017-01-05 VITALS — BP 107/69 | HR 100 | Temp 98.0°F | Ht 66.0 in | Wt 220.0 lb

## 2017-01-05 DIAGNOSIS — E669 Obesity, unspecified: Secondary | ICD-10-CM | POA: Diagnosis not present

## 2017-01-05 DIAGNOSIS — Z9189 Other specified personal risk factors, not elsewhere classified: Secondary | ICD-10-CM | POA: Diagnosis not present

## 2017-01-05 DIAGNOSIS — E559 Vitamin D deficiency, unspecified: Secondary | ICD-10-CM

## 2017-01-05 DIAGNOSIS — F410 Panic disorder [episodic paroxysmal anxiety] without agoraphobia: Secondary | ICD-10-CM

## 2017-01-05 DIAGNOSIS — IMO0001 Reserved for inherently not codable concepts without codable children: Secondary | ICD-10-CM

## 2017-01-05 DIAGNOSIS — Z6835 Body mass index (BMI) 35.0-35.9, adult: Secondary | ICD-10-CM | POA: Diagnosis not present

## 2017-01-05 MED ORDER — VITAMIN D (ERGOCALCIFEROL) 1.25 MG (50000 UNIT) PO CAPS: 50000.0000 [IU] | ORAL_CAPSULE | ORAL | 0 refills | Status: DC

## 2017-01-05 NOTE — Progress Notes (Signed)
Office: [phone]  /  Fax: [phone]   HPI:   Chief Complaint: OBESITY Laura Perez is here to discuss her progress with her obesity treatment plan. She is on the Category 2 plan and is following her eating plan approximately 100 % of the time. She states she is walking 15 minutes 2 times per week. Laura Perez eats out more but is incorporating change and is controlling her portions and makes smarter food choices. Her weight is 220 lb (99.8 kg) today and has had a weight gain of 1 pound over a period of 2 weeks since her last visit. She has gained 3 lbs since starting treatment with us.  Vitamin D deficiency Laura Perez has a diagnosis of vitamin D deficiency. She is currently taking vit D and denies nausea, vomiting or muscle weakness.  Panic Disorder Laura Perez states she had an episode of panic where she felt anxious and had associated nausea and vomiting, she saw her therapist who stopped Trintellix and started  Klonopin 0.5 mg for sleep and as needed for panic. She denies any current complaints of panic, anxiety, depression, insomnia, suicidal/homicidal ideations.  At risk for Osteopenia Laura Perez is at higher risk of osteopenia and osteoporosis due to vitamin D deficiency.    ALLERGIES: No Known Allergies  MEDICATIONS: Current Outpatient Prescriptions on File Prior to Visit  Medication Sig Dispense Refill  . beclomethasone (QVAR) 80 MCG/ACT inhaler Inhale 1 puff into the lungs as needed.    . diclofenac (VOLTAREN) 75 MG EC tablet Take 1 tablet (75 mg total) by mouth 2 (two) times daily. 60 tablet 1  . lisdexamfetamine (VYVANSE) 50 MG capsule Take 50 mg by mouth daily.    . ondansetron (ZOFRAN ODT) 4 MG disintegrating tablet Take one tab by mouth Q6hr prn nausea (Dissolve under tongue) 12 tablet 0   No current facility-administered medications on file prior to visit.     PAST MEDICAL HISTORY: Past Medical History:  Diagnosis Date  . ADHD   . Anxiety   . Asthma   . Depression   .  GERD (gastroesophageal reflux disease)   . IBS (irritable bowel syndrome)   . PCOS (polycystic ovarian syndrome)     PAST SURGICAL HISTORY: No past surgical history on file.  SOCIAL HISTORY: Social History  Substance Use Topics  . Smoking status: Never Smoker  . Smokeless tobacco: Never Used  . Alcohol use No    FAMILY HISTORY: Family History  Problem Relation Age of Onset  . Adopted: Yes    ROS: Review of Systems  Constitutional: Negative for weight loss.  Gastrointestinal: Negative for nausea and vomiting.  Musculoskeletal:       Negative muscle weakness  Psychiatric/Behavioral: Negative for depression and suicidal ideas. The patient is not nervous/anxious and does not have insomnia.     PHYSICAL EXAM: Blood pressure 107/69, pulse 100, temperature 98 F (36.7 C), temperature source Oral, height 5\' 6"  (1.676 m), weight 220 lb (99.8 kg), SpO2 97 %. Body mass index is 35.51 kg/m. Physical Exam  Constitutional: She is oriented to person, place, and time. She appears well-developed and well-nourished.  Cardiovascular: Normal rate.   Pulmonary/Chest: Effort normal.  Musculoskeletal: Normal range of motion.  Neurological: She is alert and oriented to person, place, and time.  Skin: Skin is warm and dry.  Psychiatric: She has a normal mood and affect. Her behavior is normal.  Vitals reviewed.   RECENT LABS AND TESTS: BMET    Component Value Date/Time   NA 140 12/01/2016 1051  K 4.6 12/01/2016 1051   CL 104 12/01/2016 1051   CO2 23 12/01/2016 1051   GLUCOSE 80 12/01/2016 1051   BUN 10 12/01/2016 1051   CREATININE 0.86 12/01/2016 1051   CALCIUM 9.6 12/01/2016 1051   GFRNONAA 95 12/01/2016 1051   GFRAA 109 12/01/2016 1051   Lab Results  Component Value Date   HGBA1C 5.2 12/01/2016   HGBA1C 5.2 08/19/2016   HGBA1C 5.4 05/15/2016   Lab Results  Component Value Date   INSULIN 24.6 12/01/2016   INSULIN 25.1 (H) 08/19/2016   INSULIN 30.2 (H) 05/15/2016    CBC    Component Value Date/Time   WBC 8.9 05/15/2016 1156   RBC 4.52 05/15/2016 1156   HGB 12.5 05/15/2016 1156   HCT 38.4 05/15/2016 1156   MCV 85 05/15/2016 1156   MCH 27.7 05/15/2016 1156   MCHC 32.6 05/15/2016 1156   RDW 14.1 05/15/2016 1156   LYMPHSABS 2.9 05/15/2016 1156   EOSABS 0.1 05/15/2016 1156   BASOSABS 0.1 05/15/2016 1156   Iron/TIBC/Ferritin/ %Sat No results found for: IRON, TIBC, FERRITIN, IRONPCTSAT Lipid Panel     Component Value Date/Time   CHOL 112 12/01/2016 1051   TRIG 136 12/01/2016 1051   HDL 33 (L) 12/01/2016 1051   LDLCALC 52 12/01/2016 1051   Hepatic Function Panel     Component Value Date/Time   PROT 7.6 12/01/2016 1051   ALBUMIN 4.6 12/01/2016 1051   AST 20 12/01/2016 1051   ALT 23 12/01/2016 1051   ALKPHOS 106 12/01/2016 1051   BILITOT 0.3 12/01/2016 1051      Component Value Date/Time   TSH 1.330 05/15/2016 1156    ASSESSMENT AND PLAN: Vitamin D deficiency - Plan: Vitamin D, Ergocalciferol, (DRISDOL) 50000 units CAPS capsule  Panic disorder  At risk for osteopenia  Class 2 obesity with serious comorbidity and body mass index (BMI) of 35.0 to 35.9 in adult, unspecified obesity type  PLAN:  Vitamin D Deficiency [name] was informed that low vitamin D levels contributes to fatigue and are associated with obesity, breast, and colon cancer. She agrees to continue to take prescription Vit D @50 ,000 IU every week, we will refill for 1 month and will follow up for routine testing of vitamin D, at least 2-3 times per year. She was informed of the risk of over-replacement of vitamin D and agrees to not increase her dose unless he discusses this with [place] first. [name] agrees to follow up with our clinic in 2 weeks.  Panic Disorder [name]  was informed of the risk of addiction with Klonopin and is advised to use her medicine only as prescribed and to follow up with her therapist as arranged. She agrees to not increase her dose unless  suggested by her therapist. She is advised that if she feels any changes in her mood or behavior to go to the ER.   At risk for osteopenia [name] is at risk for osteopenia and osteoporsis due to her vitamin D deficiency. She was encouraged to take her vitamin D and follow her higher calcium diet and increase strengthening exercise to help strengthen her bones and decrease her risk of osteopenia and osteoporosis.  Obesity Janeene is currently in the action stage of change. As such, her goal is to continue with weight loss efforts She has agreed to keep a food journal with 500 calories and 40+ grams of protein at supper daily and follow the Category 2 plan Yalissa has been instructed to work up to  a goal of 150 minutes of combined cardio and strengthening exercise per week for weight loss and overall health benefits. We discussed the following Behavioral Modification Strategies today: increasing lean protein intake and decrease eating out We discussed various medication options to help Jayana with her weight loss efforts and we agreed to increase Saxenda to 0.9 mg once daily.  Kyley has agreed to follow up with our clinic in 2 weeks. She was informed of the importance of frequent follow up visits to maximize her success with intensive lifestyle modifications for her multiple health conditions.   Office: [phone]  /  Fax: [phone]  OBESITY BEHAVIORAL INTERVENTION VISIT  Today's visit was # 13 out of 22.  Starting weight: 217 Starting date: 05/13/17 Today's weight : Weight: 220 lb (99.8 kg)  Today's date: 01/05/2017 Total lbs lost to date: 0 (Patients must lose 7 lbs in the first 6 months to continue with counseling)   ASK: We discussed the diagnosis of obesity with Einar Crow today and Vilma agreed to give [place] permission to discuss obesity behavioral modification therapy today.  ASSESS: Kennedie has the diagnosis of obesity and her BMI today is 35.6 Emali is in the  action stage of change   ADVISE: Tiamarie was educated on the multiple health risks of obesity as well as the benefit of weight loss to improve her health. She was advised of the need for long term treatment and the importance of lifestyle modifications.  AGREE: Multiple dietary modification options and treatment options were discussed and  Nyja agreed to keep a food journal with 500 calories and 40+g protein for supper and follow the category 2 plan. We discussed the following Behavioral Modification Stratagies today: increasing lean protein intake and decrease eating out    I, [place] Crane, am acting as transcriptionist for Illa Level, PA-C  I have reviewed the above documentation for accuracy and completeness, and I agree with the above. -Illa Level, PA-C  I have reviewed the above note and agree with the plan. -Quillian Quince, MD

## 2017-01-06 ENCOUNTER — Other Ambulatory Visit (INDEPENDENT_AMBULATORY_CARE_PROVIDER_SITE_OTHER): Payer: Self-pay | Admitting: Family Medicine

## 2017-01-06 ENCOUNTER — Telehealth (INDEPENDENT_AMBULATORY_CARE_PROVIDER_SITE_OTHER): Payer: Self-pay

## 2017-01-08 ENCOUNTER — Other Ambulatory Visit (INDEPENDENT_AMBULATORY_CARE_PROVIDER_SITE_OTHER): Payer: Self-pay

## 2017-01-08 ENCOUNTER — Telehealth (INDEPENDENT_AMBULATORY_CARE_PROVIDER_SITE_OTHER): Payer: Self-pay | Admitting: Family Medicine

## 2017-01-08 DIAGNOSIS — E669 Obesity, unspecified: Secondary | ICD-10-CM

## 2017-01-08 DIAGNOSIS — Z6835 Body mass index (BMI) 35.0-35.9, adult: Principal | ICD-10-CM

## 2017-01-08 MED ORDER — INSULIN PEN NEEDLE 32G X 4 MM MISC: 1.0000 | Freq: Every day | 0 refills | Status: DC

## 2017-01-08 MED FILL — ULTICARE PEN NDL 4MM 32G: 32G X 4 MM | 90 days supply | Qty: 100 | Fill #0

## 2017-01-08 NOTE — Telephone Encounter (Signed)
Sent a prescription over to [facility]  for pen needles.  Per Dr. Renae GlossBealsey

## 2017-01-08 NOTE — Telephone Encounter (Signed)
Liraglutide -Weight Management (SAXENDA) 18 MG/3ML SOPN  Having trouble getting ulticare pen needles from pharmacy

## 2017-01-08 NOTE — Telephone Encounter (Signed)
I sent over a prescription for pen needles to [facility].  Thank You

## 2017-01-15 ENCOUNTER — Ambulatory Visit (INDEPENDENT_AMBULATORY_CARE_PROVIDER_SITE_OTHER): Payer: 59 | Admitting: Clinical

## 2017-01-15 DIAGNOSIS — F411 Generalized anxiety disorder: Secondary | ICD-10-CM

## 2017-01-19 ENCOUNTER — Ambulatory Visit (INDEPENDENT_AMBULATORY_CARE_PROVIDER_SITE_OTHER): Payer: 59 | Admitting: Physician Assistant

## 2017-01-19 VITALS — BP 101/70 | HR 89 | Temp 98.9°F | Ht 66.0 in | Wt 218.0 lb

## 2017-01-19 DIAGNOSIS — F419 Anxiety disorder, unspecified: Secondary | ICD-10-CM | POA: Diagnosis not present

## 2017-01-19 DIAGNOSIS — E559 Vitamin D deficiency, unspecified: Secondary | ICD-10-CM

## 2017-01-19 DIAGNOSIS — E66812 Obesity, class 2: Secondary | ICD-10-CM | POA: Insufficient documentation

## 2017-01-19 DIAGNOSIS — E669 Obesity, unspecified: Secondary | ICD-10-CM | POA: Diagnosis not present

## 2017-01-19 DIAGNOSIS — Z6835 Body mass index (BMI) 35.0-35.9, adult: Secondary | ICD-10-CM | POA: Diagnosis not present

## 2017-01-19 MED ORDER — VITAMIN D (ERGOCALCIFEROL) 1.25 MG (50000 UNIT) PO CAPS: 50000.0000 [IU] | ORAL_CAPSULE | ORAL | 0 refills | Status: DC

## 2017-01-20 NOTE — Progress Notes (Signed)
Office: [phone]  /  Fax: [phone]   HPI:   Chief Complaint: OBESITY Laura Perez is here to discuss her progress with her obesity treatment plan. She is on the keep a food journal with 500 calories and 40+ grams of protein at supper daily and the Category 2 plan and is following her eating plan approximately 100 % of the time. She states she is walking/stairs for 30 minutes 3 to 4 times per week. Laura Perez continues to do well with weight loss. She states her mother is helping more with the cooking and is thus getting more protein intake. She still eats out more and has high calorie drinks. Her weight is 218 lb (98.9 kg) today and has had a weight loss of 2 pounds over a period of 2 weeks since her last visit. She has gained 1 lb since starting treatment with [place].  Vitamin D deficiency Laura Perez has a diagnosis of vitamin D deficiency. She is currently taking vit D and denies nausea, vomiting or muscle weakness.  Anxiety Laura Perez's mood is stable. She shows no sign of suicidal ideas and states Klonopin has helped greatly with her mood and anxiety.   ALLERGIES: No Known Allergies  MEDICATIONS: Current Outpatient Prescriptions on File Prior to Visit  Medication Sig Dispense Refill   beclomethasone (QVAR) 80 MCG/ACT inhaler Inhale 1 puff into the lungs as needed.     clonazePAM (KLONOPIN) 0.5 MG tablet Take 0.5 mg by mouth 2 (two) times daily as needed for anxiety (Take one tablet at bedtime and one tablet daily as needed for panic.).     diclofenac (VOLTAREN) 75 MG EC tablet Take 1 tablet (75 mg total) by mouth 2 (two) times daily. 60 tablet 1   Insulin Pen Needle 32G X 4 MM MISC 1 each by Does not apply route daily. 100 each 0   Liraglutide -Weight Management (SAXENDA) 18 MG/3ML SOPN Inject 3 mg into the skin.     lisdexamfetamine (VYVANSE) 50 MG capsule Take 50 mg by mouth daily.     ondansetron (ZOFRAN ODT) 4 MG disintegrating tablet Take one tab by mouth Q6hr prn nausea  (Dissolve under tongue) 12 tablet 0   No current facility-administered medications on file prior to visit.     PAST MEDICAL HISTORY: Past Medical History:  Diagnosis Date   ADHD    Anxiety    Asthma    Depression    GERD (gastroesophageal reflux disease)    IBS (irritable bowel syndrome)    PCOS (polycystic ovarian syndrome)     PAST SURGICAL HISTORY: No past surgical history on file.  SOCIAL HISTORY: Social History  Substance Use Topics   Smoking status: Never Smoker   Smokeless tobacco: Never Used   Alcohol use No    FAMILY HISTORY: Family History  Problem Relation Age of Onset   Adopted: Yes    ROS: Review of Systems  Constitutional: Positive for weight loss.  Gastrointestinal: Negative for nausea and vomiting.  Musculoskeletal:       Negative muscle weakness  Psychiatric/Behavioral: Negative for suicidal ideas.       Anxiety    PHYSICAL EXAM: Blood pressure 101/70, pulse 89, temperature 98.9 F (37.2 C), temperature source Oral, height 5\' 6"  (1.676 m), weight 218 lb (98.9 kg), SpO2 97 %. Body mass index is 35.19 kg/m. Physical Exam  Constitutional: She is oriented to person, place, and time. She appears well-developed and well-nourished.  Cardiovascular: Normal rate.   Pulmonary/Chest: Effort normal.  Musculoskeletal: Normal range of  motion.  Neurological: She is oriented to person, place, and time.  Skin: Skin is warm and dry.  Psychiatric: She has a normal mood and affect. Her behavior is normal.  Vitals reviewed.   RECENT LABS AND TESTS: BMET    Component Value Date/Time   NA 140 12/01/2016 1051   K 4.6 12/01/2016 1051   CL 104 12/01/2016 1051   CO2 23 12/01/2016 1051   GLUCOSE 80 12/01/2016 1051   BUN 10 12/01/2016 1051   CREATININE 0.86 12/01/2016 1051   CALCIUM 9.6 12/01/2016 1051   GFRNONAA 95 12/01/2016 1051   GFRAA 109 12/01/2016 1051   Lab Results  Component Value Date   HGBA1C 5.2 12/01/2016   HGBA1C 5.2  08/19/2016   HGBA1C 5.4 05/15/2016   Lab Results  Component Value Date   INSULIN 24.6 12/01/2016   INSULIN 25.1 (H) 08/19/2016   INSULIN 30.2 (H) 05/15/2016   CBC    Component Value Date/Time   WBC 8.9 05/15/2016 1156   RBC 4.52 05/15/2016 1156   HGB 12.5 05/15/2016 1156   HCT 38.4 05/15/2016 1156   MCV 85 05/15/2016 1156   MCH 27.7 05/15/2016 1156   MCHC 32.6 05/15/2016 1156   RDW 14.1 05/15/2016 1156   LYMPHSABS 2.9 05/15/2016 1156   EOSABS 0.1 05/15/2016 1156   BASOSABS 0.1 05/15/2016 1156   Iron/TIBC/Ferritin/ %Sat No results found for: IRON, TIBC, FERRITIN, IRONPCTSAT Lipid Panel     Component Value Date/Time   CHOL 112 12/01/2016 1051   TRIG 136 12/01/2016 1051   HDL 33 (L) 12/01/2016 1051   LDLCALC 52 12/01/2016 1051   Hepatic Function Panel     Component Value Date/Time   PROT 7.6 12/01/2016 1051   ALBUMIN 4.6 12/01/2016 1051   AST 20 12/01/2016 1051   ALT 23 12/01/2016 1051   ALKPHOS 106 12/01/2016 1051   BILITOT 0.3 12/01/2016 1051      Component Value Date/Time   TSH 1.330 05/15/2016 1156    ASSESSMENT AND PLAN: Vitamin D deficiency - Plan: Vitamin D, Ergocalciferol, (DRISDOL) 50000 units CAPS capsule  Anxiety  Class 2 obesity without serious comorbidity with body mass index (BMI) of 35.0 to 35.9 in adult, unspecified obesity type  PLAN:  Vitamin D Deficiency Laura Perez was informed that low vitamin D levels contributes to fatigue and are associated with obesity, breast, and colon cancer. She agrees to continue to take prescription Vit D @50 ,000 IU every week, we will refill for 1 month and will follow up for routine testing of vitamin D, at least 2-3 times per year. She was informed of the risk of over-replacement of vitamin D and agrees to not increase her dose unless he discusses this with [place] first. Laura Perez agrees to follow up with our clinic in 2 weeks.  Anxiety Laura Perez agrees to follow up with her Psychiatrist as scheduled and will follow up  with our clinic in 2 weeks.  Obesity Laura Perez is currently in the action stage of change. As such, her goal is to continue with weight loss efforts She has agreed to keep a food journal with 500 calories and 40+ grams of protein at supper daily and follow the Category 2 plan Laura Perez has been instructed to work up to a goal of 150 minutes of combined cardio and strengthening exercise per week for weight loss and overall health benefits. We discussed the following Behavioral Modification Strategies today: increasing lean protein intake and decrease liquid calories We discussed various medication options to help Laura Perez  with her weight loss efforts and we both agreed to continue Saxenda and increase to 1.2 mg. Laura Perez was educated on the importance of strictly following the plan as she has not lost weight since starting the program over 6 months ago.  Laura Perez has agreed to follow up with our clinic in 2 weeks. She was informed of the importance of frequent follow up visits to maximize her success with intensive lifestyle modifications for her multiple health conditions.  I, [place] Crane, am acting as transcriptionist for Illa Level, PA-C  I have reviewed the above documentation for accuracy and completeness, and I agree with the above. -Illa Level, PA-C  I have reviewed the above note and agree with the plan. -Quillian Quince, MD   OBESITY BEHAVIORAL INTERVENTION VISIT  Today's visit was # 15 out of 22.  Starting weight: 217 lbs Starting date: 05/13/16 Today's weight : 218 lbs Today's date: 01/19/2017 Total lbs lost to date: 0 (Patients must lose 7 lbs in the first 6 months to continue with counseling)   ASK: We discussed the diagnosis of obesity with Einar Crow today and Susannah agreed to give [place] permission to discuss obesity behavioral modification therapy today.  ASSESS: Xela has the diagnosis of obesity and her BMI today is 35.2 Marykay is in the action stage of change    ADVISE: Merryn was educated on the multiple health risks of obesity as well as the benefit of weight loss to improve her health. She was advised of the need for long term treatment and the importance of lifestyle modifications.  AGREE: Multiple dietary modification options and treatment options were discussed and  Maurissa agreed to keep a food journal with 500 calories and 40+ grams of protein at supper daily and follow the Category 2 plan We discussed the following Behavioral Modification Strategies today: increasing lean protein intake and decrease liquid calories

## 2017-01-22 ENCOUNTER — Other Ambulatory Visit (INDEPENDENT_AMBULATORY_CARE_PROVIDER_SITE_OTHER): Payer: Self-pay | Admitting: Family Medicine

## 2017-01-22 ENCOUNTER — Ambulatory Visit (INDEPENDENT_AMBULATORY_CARE_PROVIDER_SITE_OTHER): Payer: 59 | Admitting: Family Medicine

## 2017-01-22 DIAGNOSIS — Z6835 Body mass index (BMI) 35.0-35.9, adult: Principal | ICD-10-CM

## 2017-01-22 DIAGNOSIS — E669 Obesity, unspecified: Secondary | ICD-10-CM

## 2017-01-27 ENCOUNTER — Encounter (INDEPENDENT_AMBULATORY_CARE_PROVIDER_SITE_OTHER): Payer: Self-pay

## 2017-02-02 ENCOUNTER — Ambulatory Visit (INDEPENDENT_AMBULATORY_CARE_PROVIDER_SITE_OTHER): Payer: No Typology Code available for payment source | Admitting: Physician Assistant

## 2017-02-02 VITALS — BP 101/69 | HR 87 | Temp 98.0°F | Ht 66.0 in | Wt 216.0 lb

## 2017-02-02 DIAGNOSIS — E669 Obesity, unspecified: Secondary | ICD-10-CM | POA: Diagnosis not present

## 2017-02-02 DIAGNOSIS — E8881 Metabolic syndrome: Secondary | ICD-10-CM

## 2017-02-02 DIAGNOSIS — Z6834 Body mass index (BMI) 34.0-34.9, adult: Secondary | ICD-10-CM

## 2017-02-02 DIAGNOSIS — E88819 Insulin resistance, unspecified: Secondary | ICD-10-CM | POA: Insufficient documentation

## 2017-02-02 DIAGNOSIS — E559 Vitamin D deficiency, unspecified: Secondary | ICD-10-CM | POA: Diagnosis not present

## 2017-02-02 DIAGNOSIS — Z9189 Other specified personal risk factors, not elsewhere classified: Secondary | ICD-10-CM

## 2017-02-02 MED ORDER — VITAMIN D (ERGOCALCIFEROL) 1.25 MG (50000 UNIT) PO CAPS: 50000.0000 [IU] | ORAL_CAPSULE | ORAL | 0 refills | Status: DC

## 2017-02-02 MED ORDER — LIRAGLUTIDE -WEIGHT MANAGEMENT 18 MG/3ML ~~LOC~~ SOPN: 3.0000 mg | PEN_INJECTOR | Freq: Every day | SUBCUTANEOUS | 0 refills | Status: DC

## 2017-02-02 NOTE — Progress Notes (Signed)
Office: [phone]  /  Fax: [phone]   HPI:   Chief Complaint: OBESITY Laura Perez is here to discuss her progress with her obesity treatment plan. She is on the Category 2 plan and is following her eating plan approximately 100 % of the time. She states she is exercising walking 15 minutes 3 times per week. Laura Perez continues to do well with weight loss. She has not lost  Weight in the first 6 months of the program,  but is now motivated to follow the meal plan. She states that she still skips meals and drinks high calorie drinks.  Her weight is 216 lb (98 kg) today and has had a weight loss of 2 pounds over a period of 2 to 3 weeks since her last visit. She has lost 1 lb since starting treatment with us.  Vitamin D deficiency Laura Perez has a diagnosis of vitamin D deficiency. She is currently taking vit D and denies nausea, vomiting or muscle weakness.  Insulin Resistance Laura Perez has a diagnosis of insulin resistance based on her elevated fasting insulin Perez >5. Although Laura Perez's blood glucose readings are still under good control, insulin resistance puts her at greater risk of metabolic syndrome and diabetes. She is not taking metformin currently and continues to work on diet and exercise to decrease risk of diabetes.  At risk for diabetes Laura Perez is at higher than average risk for developing diabetes due to her obesity and diabetes. She currently denies polyuria or polydipsia.  ALLERGIES: No Known Allergies  MEDICATIONS: Current Outpatient Prescriptions on File Prior to Visit  Medication Sig Dispense Refill  . beclomethasone (QVAR) 80 MCG/ACT inhaler Inhale 1 puff into the lungs as needed.    . clonazePAM (KLONOPIN) 0.5 MG tablet Take 0.5 mg by mouth 2 (two) times daily as needed for anxiety (Take one tablet at bedtime and one tablet daily as needed for panic.).    Marland Kitchen. diclofenac (VOLTAREN) 75 MG EC tablet Take 1 tablet (75 mg total) by mouth 2 (two) times daily. 60 tablet 1  .  Insulin Pen Needle 32G X 4 MM MISC 1 each by Does not apply route daily. 100 each 0  . lisdexamfetamine (VYVANSE) 50 MG capsule Take 50 mg by mouth daily.    . ondansetron (ZOFRAN ODT) 4 MG disintegrating tablet Take one tab by mouth Q6hr prn nausea (Dissolve under tongue) 12 tablet 0   No current facility-administered medications on file prior to visit.     PAST MEDICAL HISTORY: Past Medical History:  Diagnosis Date  . ADHD   . Anxiety   . Asthma   . Depression   . GERD (gastroesophageal reflux disease)   . IBS (irritable bowel syndrome)   . PCOS (polycystic ovarian syndrome)     PAST SURGICAL HISTORY: No past surgical history on file.  SOCIAL HISTORY: Social History  Substance Use Topics  . Smoking status: Never Smoker  . Smokeless tobacco: Never Used  . Alcohol use No    FAMILY HISTORY: Family History  Problem Relation Age of Onset  . Adopted: Yes    ROS: Review of Systems  Constitutional: Positive for weight loss.  Gastrointestinal: Negative for nausea and vomiting.  Genitourinary: Negative for frequency.  Musculoskeletal:       Negative muscle weakness  Endo/Heme/Allergies: Negative for polydipsia.    PHYSICAL EXAM: Blood pressure 101/69, pulse 87, temperature 98 F (36.7 C), temperature source Oral, height 5\' 6"  (1.676 m), weight 216 lb (98 kg), SpO2 97 %. Body mass index  is 34.86 kg/m. Physical Exam  Constitutional: She is oriented to person, place, and time. She appears well-developed and well-nourished.  Cardiovascular: Normal rate.   Pulmonary/Chest: Effort normal.  Musculoskeletal: Normal range of motion.  Neurological: She is oriented to person, place, and time.  Skin: Skin is warm and dry.  Psychiatric: She has a normal mood and affect. Her behavior is normal.  Vitals reviewed.   RECENT LABS AND TESTS: BMET    Component Value Date/Time   NA 140 12/01/2016 1051   K 4.6 12/01/2016 1051   CL 104 12/01/2016 1051   CO2 23 12/01/2016 1051    GLUCOSE 80 12/01/2016 1051   BUN 10 12/01/2016 1051   CREATININE 0.86 12/01/2016 1051   CALCIUM 9.6 12/01/2016 1051   GFRNONAA 95 12/01/2016 1051   GFRAA 109 12/01/2016 1051   Lab Results  Component Value Date   HGBA1C 5.2 12/01/2016   HGBA1C 5.2 08/19/2016   HGBA1C 5.4 05/15/2016   Lab Results  Component Value Date   INSULIN 24.6 12/01/2016   INSULIN 25.1 (H) 08/19/2016   INSULIN 30.2 (H) 05/15/2016   CBC    Component Value Date/Time   WBC 8.9 05/15/2016 1156   RBC 4.52 05/15/2016 1156   HGB 12.5 05/15/2016 1156   HCT 38.4 05/15/2016 1156   MCV 85 05/15/2016 1156   MCH 27.7 05/15/2016 1156   MCHC 32.6 05/15/2016 1156   RDW 14.1 05/15/2016 1156   LYMPHSABS 2.9 05/15/2016 1156   EOSABS 0.1 05/15/2016 1156   BASOSABS 0.1 05/15/2016 1156   Iron/TIBC/Ferritin/ %Sat No results found for: IRON, TIBC, FERRITIN, IRONPCTSAT Lipid Panel     Component Value Date/Time   CHOL 112 12/01/2016 1051   TRIG 136 12/01/2016 1051   HDL 33 (L) 12/01/2016 1051   LDLCALC 52 12/01/2016 1051   Hepatic Function Panel     Component Value Date/Time   PROT 7.6 12/01/2016 1051   ALBUMIN 4.6 12/01/2016 1051   AST 20 12/01/2016 1051   ALT 23 12/01/2016 1051   ALKPHOS 106 12/01/2016 1051   BILITOT 0.3 12/01/2016 1051      Component Value Date/Time   TSH 1.330 05/15/2016 1156    ASSESSMENT AND PLAN: Vitamin D deficiency - Plan: Vitamin D, Ergocalciferol, (DRISDOL) 50000 units CAPS capsule  Insulin resistance  At risk for diabetes mellitus  Class 1 obesity with serious comorbidity and body mass index (BMI) of 34.0 to 34.9 in adult, unspecified obesity type - Plan: Liraglutide -Weight Management (SAXENDA) 18 MG/3ML SOPN  PLAN:  Vitamin D Deficiency Metta was informed that low vitamin D levels contributes to fatigue and are associated with obesity, breast, and colon cancer. She agrees to continue taking prescription Vit D ,000 IU every week #4 and we will refill for 1 month. She  follow up for routine testing of vitamin D, at least 2-3 times per year. She was informed of the risk of over-replacement of vitamin D and agrees to not increase her dose unless he discusses this with [place] first. Laura Perez agrees to follow up with our clinic in 2 weeks.  Insulin Resistance Laura Perez will continue to work on weight loss, exercise, and decreasing simple carbohydrates in her diet to help decrease the risk of diabetes. We dicussed metformin including benefits and risks. She was informed that eating too many simple carbohydrates or too many calories at one sitting increases the likelihood of GI side effects. Laura Perez declined metformin for now and prescription was not written today. Laura Perez agreed to continue  diet and exercise and will follow up with our clinic in 2 weeks as directed to monitor her progress.  Diabetes risk counselling Laura Perez was given extended (15 minutes) diabetes prevention counseling today. She is 26 y.o. female and has risk factors for diabetes including obesity and diabetes. We discussed intensive lifestyle modifications today with an emphasis on weight loss as well as increasing exercise and decreasing simple carbohydrates in her diet.  Obesity Laura Perez is currently in the action stage of change. As such, her goal is to continue with weight loss efforts She has agreed to follow the Category 2 plan Laura Perez has been instructed to work up to a goal of 150 minutes of combined cardio and strengthening exercise per week for weight loss and overall health benefits. We discussed the following Behavioral Modification Strategies today: increasing lean protein intake, decrease liquid calories, and no skipping meals We discussed various medication options to help Laura Perez with her weight loss efforts and we both agreed to increase saxenda to 1.5 mg and we will refill for 5 pens.   Esthela has agreed to follow up with our clinic in 2 weeks. She was informed of the importance of  frequent follow up visits to maximize her success with intensive lifestyle modifications for her multiple health conditions.  I, Burt Knack, am acting as transcriptionist for Laura Level, PA-C  I have reviewed the above documentation for accuracy and completeness, and I agree with the above. -Laura Level, PA-C  I have reviewed the above note and agree with the plan. -Quillian Quince, MD   OBESITY BEHAVIORAL INTERVENTION VISIT  Today's visit was # 16 out of 22.  Starting weight: 217 lbs Starting date: 05/13/16 Today's weight: 216 lbs  Today's date: 02/02/17 Total lbs lost to date: 1 (Patients must lose 7 lbs in the first 6 months to continue with counseling)   ASK: We discussed the diagnosis of obesity with Einar Crow today and Kamorah agreed to give [place] permission to discuss obesity behavioral modification therapy today.  ASSESS: Lynora has the diagnosis of obesity and her BMI today is 34 Lakashia is in the action stage of change   ADVISE: Corra was educated on the multiple health risks of obesity as well as the benefit of weight loss to improve her health. She was advised of the need for long term treatment and the importance of lifestyle modifications.  AGREE: Multiple dietary modification options and treatment options were discussed and  Cayla agreed to follow the Category 2 plan We discussed the following Behavioral Modification Strategies today: increasing lean protein intake, decrease liquid calories, and no skipping meals

## 2017-02-09 ENCOUNTER — Ambulatory Visit (INDEPENDENT_AMBULATORY_CARE_PROVIDER_SITE_OTHER): Payer: No Typology Code available for payment source | Admitting: Clinical

## 2017-02-09 DIAGNOSIS — F411 Generalized anxiety disorder: Secondary | ICD-10-CM

## 2017-02-10 ENCOUNTER — Telehealth (INDEPENDENT_AMBULATORY_CARE_PROVIDER_SITE_OTHER): Payer: Self-pay | Admitting: Family Medicine

## 2017-02-10 NOTE — Telephone Encounter (Signed)
Liraglutide -Weight Management (SAXENDA) 18 MG/3ML SOPN  Vomiting and diarrhea 3 days , is there something else to take or lower dosage   Contact pt at [phone]

## 2017-02-10 NOTE — Telephone Encounter (Signed)
PA Sahar spoke with the patient and after obtaining more details of her symptoms advised her to be seen by her PCP ASAP. Laura Perez will do a follow up call to check the status of the patient and see how her appt with her PCP went within the next 2 days. April, CMA  I spoke with the patient and she states she increased dose of Saxenda to 1.8 mg and started having nonspecific abd pain, vomiting and diarrhea for 3 days last week. She stopped the Saxenda, and last vomitus and diarrhea episode were 5 days ago. Abd pain has improved but she contines to have non specific crampy pain throughout the belly, typical of her "nervous belly" pain. She has had regular bowel movements since then, last BM 3 days ago. States she has not passed gas for past 3 days. Since she voices improvement of her symptoms with cessation of Saxenda, she is advised to stop the [place], and to follow up with her PCP as soon as she can. She is instructed to go to the ER if symptoms return, worsen, or change. She is to follow up with our clinic as previously planned.  Illa Level, [facility]

## 2017-02-13 MED FILL — VIT D2 1.25 MG (50,000 UNIT: 1.25 MG | 28 days supply | Qty: 4 | Fill #0

## 2017-02-16 ENCOUNTER — Ambulatory Visit (INDEPENDENT_AMBULATORY_CARE_PROVIDER_SITE_OTHER): Payer: No Typology Code available for payment source | Admitting: Physician Assistant

## 2017-02-16 VITALS — BP 104/68 | HR 84 | Temp 98.2°F | Ht 66.0 in | Wt 217.0 lb

## 2017-02-16 DIAGNOSIS — Z6835 Body mass index (BMI) 35.0-35.9, adult: Secondary | ICD-10-CM

## 2017-02-16 DIAGNOSIS — E669 Obesity, unspecified: Secondary | ICD-10-CM

## 2017-02-16 DIAGNOSIS — Z9189 Other specified personal risk factors, not elsewhere classified: Secondary | ICD-10-CM | POA: Diagnosis not present

## 2017-02-16 DIAGNOSIS — F3289 Other specified depressive episodes: Secondary | ICD-10-CM

## 2017-02-16 DIAGNOSIS — IMO0001 Reserved for inherently not codable concepts without codable children: Secondary | ICD-10-CM

## 2017-02-16 DIAGNOSIS — E559 Vitamin D deficiency, unspecified: Secondary | ICD-10-CM | POA: Diagnosis not present

## 2017-02-16 MED ORDER — BUPROPION HCL ER (SR) 150 MG PO TB12: 150.0000 mg | ORAL_TABLET | Freq: Every day | ORAL | 0 refills | Status: DC

## 2017-02-16 NOTE — Progress Notes (Signed)
Office: [phone]  /  Fax: [phone]   HPI:   Chief Complaint: OBESITY Laura Perez is here to discuss her progress with her obesity treatment plan. She is on the Category 2 plan and is following her eating plan approximately 100 % of the time. She states she is walking for 15 minutes 3 times per week. Laura Perez tries to [employer] choices but she still struggles with following the meal plan. Laura Perez was increased to 1.8 mg and she had vomiting and diarrhea, as she has had high calorie foods. Laura Perez would like to stop [place]. Her weight is 217 lb (98.4 kg) today and she has had a weight gain of 1 lb over a period of 2 weeks since her last visit. She has lost 0 lbs since starting treatment with [place].  Vitamin D deficiency Laura Perez has a diagnosis of vitamin D deficiency. She is currently taking vit D and denies nausea, vomiting or muscle weakness.  At risk for osteopenia and osteoporosis Laura Perez is at higher risk of osteopenia and osteoporosis due to vitamin D deficiency.   Depression with emotional eating behaviors Laura Perez is struggling with emotional eating and using food for comfort to the extent that it is negatively impacting her health. She often snacks when she is not hungry. Laura Perez sometimes feels she is out of control and then feels guilty that she made poor food choices. She has been working on behavior modification techniques to help reduce her emotional eating and has been somewhat successful. Her mood is stable and she shows no sign of suicidal or homicidal ideations.  Depression screen PHQ 2/9 05/13/2016  Decreased Interest 1  Down, Depressed, Hopeless 3  PHQ - 2 Score 4  Altered sleeping 2  Tired, decreased energy 1  Change in appetite 3  Feeling bad or failure about yourself  3  Trouble concentrating 2  Moving slowly or fidgety/restless 0  Suicidal thoughts 1  PHQ-9 Score 16      ALLERGIES: No Known Allergies  MEDICATIONS: Current Outpatient  Prescriptions on File Prior to Visit  Medication Sig Dispense Refill   beclomethasone (QVAR) 80 MCG/ACT inhaler Inhale 1 puff into the lungs as needed.     clonazePAM (KLONOPIN) 0.5 MG tablet Take 0.5 mg by mouth 2 (two) times daily as needed for anxiety (Take one tablet at bedtime and one tablet daily as needed for panic.).     diclofenac (VOLTAREN) 75 MG EC tablet Take 1 tablet (75 mg total) by mouth 2 (two) times daily. 60 tablet 1   Insulin Pen Needle 32G X 4 MM MISC 1 each by Does not apply route daily. 100 each 0   lisdexamfetamine (VYVANSE) 50 MG capsule Take 50 mg by mouth daily.     ondansetron (ZOFRAN ODT) 4 MG disintegrating tablet Take one tab by mouth Q6hr prn nausea (Dissolve under tongue) 12 tablet 0   Vitamin D, Ergocalciferol, (DRISDOL) 50000 units CAPS capsule Take 1 capsule (50,000 Units total) by mouth every 7 (seven) days. 4 capsule 0   No current facility-administered medications on file prior to visit.     PAST MEDICAL HISTORY: Past Medical History:  Diagnosis Date   ADHD    Anxiety    Asthma    Depression    GERD (gastroesophageal reflux disease)    IBS (irritable bowel syndrome)    PCOS (polycystic ovarian syndrome)     PAST SURGICAL HISTORY: No past surgical history on file.  SOCIAL HISTORY: Social History  Substance Use Topics  Smoking status: Never Smoker   Smokeless tobacco: Never Used   Alcohol use No    FAMILY HISTORY: Family History  Problem Relation Age of Onset   Adopted: Yes    ROS: Review of Systems  Constitutional: Negative for weight loss.  Gastrointestinal: Negative for nausea and vomiting.  Musculoskeletal:       Negative muscle weakness  Psychiatric/Behavioral: Positive for depression. Negative for suicidal ideas.    PHYSICAL EXAM: Blood pressure 104/68, pulse 84, temperature 98.2 F (36.8 C), temperature source Oral, height  (1.676 m), weight 217 lb (98.4 kg), SpO2 100 %. Body mass index is 35.02  kg/m. Physical Exam  Constitutional: She is oriented to person, place, and time. She appears well-developed and well-nourished.  Cardiovascular: Normal rate.   Pulmonary/Chest: Effort normal.  Musculoskeletal: Normal range of motion.  Neurological: She is oriented to person, place, and time.  Skin: Skin is warm and dry.  Psychiatric: She has a normal mood and affect. Her behavior is normal.  Vitals reviewed.   RECENT LABS AND TESTS: BMET    Component Value Date/Time   NA 140 12/01/2016 1051   K 4.6 12/01/2016 1051   CL 104 12/01/2016 1051   CO2 23 12/01/2016 1051   GLUCOSE 80 12/01/2016 1051   BUN 10 12/01/2016 1051   CREATININE 0.86 12/01/2016 1051   CALCIUM 9.6 12/01/2016 1051   GFRNONAA 95 12/01/2016 1051   GFRAA 109 12/01/2016 1051   Lab Results  Component Value Date   HGBA1C 5.2 12/01/2016   HGBA1C 5.2 08/19/2016   HGBA1C 5.4 05/15/2016   Lab Results  Component Value Date   INSULIN 24.6 12/01/2016   INSULIN 25.1 (H) 08/19/2016   INSULIN 30.2 (H) 05/15/2016   CBC    Component Value Date/Time   WBC 8.9 05/15/2016 1156   RBC 4.52 05/15/2016 1156   HGB 12.5 05/15/2016 1156   HCT 38.4 05/15/2016 1156   MCV 85 05/15/2016 1156   MCH 27.7 05/15/2016 1156   MCHC 32.6 05/15/2016 1156   RDW 14.1 05/15/2016 1156   LYMPHSABS 2.9 05/15/2016 1156   EOSABS 0.1 05/15/2016 1156   BASOSABS 0.1 05/15/2016 1156   Iron/TIBC/Ferritin/ %Sat No results found for: IRON, TIBC, FERRITIN, IRONPCTSAT Lipid Panel     Component Value Date/Time   CHOL 112 12/01/2016 1051   TRIG 136 12/01/2016 1051   HDL 33 (L) 12/01/2016 1051   LDLCALC 52 12/01/2016 1051   Hepatic Function Panel     Component Value Date/Time   PROT 7.6 12/01/2016 1051   ALBUMIN 4.6 12/01/2016 1051   AST 20 12/01/2016 1051   ALT 23 12/01/2016 1051   ALKPHOS 106 12/01/2016 1051   BILITOT 0.3 12/01/2016 1051      Component Value Date/Time   TSH 1.330 05/15/2016 1156    ASSESSMENT AND PLAN: Vitamin D  deficiency  Other depression - with emotional eating - Plan: buPROPion (WELLBUTRIN SR) 150 MG 12 hr tablet  At risk for osteoporosis  Class 2 obesity with serious comorbidity and body mass index (BMI) of 35.0 to 35.9 in adult, unspecified obesity type  PLAN:  Vitamin D Deficiency Laura Perez was informed that low vitamin D levels contributes to fatigue and are associated with obesity, breast, and colon cancer. She agrees to continue to take prescription Vit D ,000 IU every week and will follow up for routine testing of vitamin D, at least 2-3 times per year. She was informed of the risk of over-replacement of vitamin D and agrees  to not increase her dose unless he discusses this with [place] first.  At risk for osteopenia and osteoporosis Laura Perez is at risk for osteopenia and osteoporosis due to her vitamin D deficiency. She was encouraged to take her vitamin D and follow her higher calcium diet and increase strengthening exercise to help strengthen her bones and decrease her risk of osteopenia and osteoporosis.  Depression with Emotional Eating Behaviors We discussed behavior modification techniques today to help Laura Perez deal with her emotional eating and depression. She has agreed to start to take Wellbutrin SR 150 mg qd #30 with no refills and agreed to follow up as directed.  Obesity Laura Perez is currently in the action stage of change. As such, her goal is to continue with weight loss efforts She has agreed to follow the Category 2 plan Laura Perez has been instructed to work up to a goal of 150 minutes of combined cardio and strengthening exercise per week for weight loss and overall health benefits. We discussed the following Behavioral Modification Strategies today: increasing lean protein intake and work on meal planning and easy cooking plans  We discussed various medication options to help Brailey with her weight loss efforts and we both agreed to stop Laura Perez has agreed to follow  up with our clinic in 2 weeks. She was informed of the importance of frequent follow up visits to maximize her success with intensive lifestyle modifications for her multiple health conditions.  I, [place] Crane, am acting as transcriptionist for Illa Level, PA-C  I have reviewed the above documentation for accuracy and completeness, and I agree with the above. -Illa Level, PA-C  I have reviewed the above note and agree with the plan. -Quillian Quince, MD   OBESITY BEHAVIORAL INTERVENTION VISIT  Today's visit was # 17 out of 22.  Starting weight: 217 lbs Starting date: 05/13/16 Today's weight : 217 lbs Today's date: 02/16/2017 Total lbs lost to date: 0 (Patients must lose 7 lbs in the first 6 months to continue with counseling)   ASK: We discussed the diagnosis of obesity with Laura Perez today and Laura Perez agreed to give [place] permission to discuss obesity behavioral modification therapy today.  ASSESS: Laura Perez has the diagnosis of obesity and her BMI today is 35.04 Laura Perez is in the action stage of change   ADVISE: Laura Perez was educated on the multiple health risks of obesity as well as the benefit of weight loss to improve her health. She was advised of the need for long term treatment and the importance of lifestyle modifications.  AGREE: Multiple dietary modification options and treatment options were discussed and  Laura Perez agreed to follow the Category 2 plan We discussed the following Behavioral Modification Strategies today: increasing lean protein intake and work on meal planning and easy cooking plans

## 2017-02-17 MED FILL — BUPROPION SR 150 MG TABLET: 150 | 30 days supply | Qty: 30 | Fill #0

## 2017-03-05 ENCOUNTER — Ambulatory Visit (INDEPENDENT_AMBULATORY_CARE_PROVIDER_SITE_OTHER): Payer: No Typology Code available for payment source | Admitting: Physician Assistant

## 2017-03-05 VITALS — BP 104/71 | HR 86 | Temp 97.6°F | Ht 66.0 in | Wt 217.0 lb

## 2017-03-05 DIAGNOSIS — E782 Mixed hyperlipidemia: Secondary | ICD-10-CM | POA: Diagnosis not present

## 2017-03-05 DIAGNOSIS — Z6835 Body mass index (BMI) 35.0-35.9, adult: Secondary | ICD-10-CM

## 2017-03-05 DIAGNOSIS — E559 Vitamin D deficiency, unspecified: Secondary | ICD-10-CM | POA: Diagnosis not present

## 2017-03-05 DIAGNOSIS — Z9189 Other specified personal risk factors, not elsewhere classified: Secondary | ICD-10-CM

## 2017-03-05 DIAGNOSIS — E8881 Metabolic syndrome: Secondary | ICD-10-CM

## 2017-03-05 MED FILL — clonazePAM 0.5 MG TABS: 0.5 | 30 days supply | Qty: 60 | Fill #1

## 2017-03-05 NOTE — Progress Notes (Signed)
Office: [phone]  /  Fax: [phone]   HPI:   Chief Complaint: OBESITY Birdia is here to discuss her progress with her obesity treatment plan. She is on the Category 2 plan and is following her eating plan approximately 100 % of the time. She states she is exercising 0 minutes 0 times per week. Jamia has maintained her weight and states she is making more mindful food choices, but she still does not get all her protein in. She would like more variety with her meals.  Her weight is 217 lb (98.4 kg) today and has not lost weight since her last visit. She has lost 0 lbs since starting treatment with [place].  Hyperlipidemia Mixed Beckett has hyperlipidemia mixed and has been trying to improve her cholesterol levels with intensive lifestyle modification including a low saturated fat diet, exercise and weight loss. She is not on statin. She denies any chest pain, claudication or myalgias.  Vitamin D deficiency Elmire has a diagnosis of vitamin D deficiency. She is currently taking prescription Vit D and denies nausea, vomiting or muscle weakness.  Insulin Resistance Eudora has a diagnosis of insulin resistance based on her elevated fasting insulin level >5. Although Montia's blood glucose readings are still under good control, insulin resistance puts her at greater risk of metabolic syndrome and diabetes. She denies polyphagia.  She is not taking metformin currently and continues to work on diet and exercise to decrease risk of diabetes.   At risk for diabetes Lorenia is at higher than averagerisk for developing diabetes due to her obesity. She currently denies polyuria or polydipsia.  ALLERGIES: No Known Allergies  MEDICATIONS: Current Outpatient Prescriptions on File Prior to Visit  Medication Sig Dispense Refill  . beclomethasone (QVAR) 80 MCG/ACT inhaler Inhale 1 puff into the lungs as needed.    . clonazePAM (KLONOPIN) 0.5 MG tablet Take 0.5 mg by mouth 2 (two) times daily as  needed for anxiety (Take one tablet at bedtime and one tablet daily as needed for panic.).    Marland Kitchen diclofenac (VOLTAREN) 75 MG EC tablet Take 1 tablet (75 mg total) by mouth 2 (two) times daily. 60 tablet 1  . Insulin Pen Needle 32G X 4 MM MISC 1 each by Does not apply route daily. 100 each 0  . lisdexamfetamine (VYVANSE) 50 MG capsule Take 50 mg by mouth daily.    . ondansetron (ZOFRAN ODT) 4 MG disintegrating tablet Take one tab by mouth Q6hr prn nausea (Dissolve under tongue) 12 tablet 0  . Vitamin D, Ergocalciferol, (DRISDOL) 50000 units CAPS capsule Take 1 capsule (50,000 Units total) by mouth every 7 (seven) days. 4 capsule 0   No current facility-administered medications on file prior to visit.     PAST MEDICAL HISTORY: Past Medical History:  Diagnosis Date  . ADHD   . Anxiety   . Asthma   . Depression   . GERD (gastroesophageal reflux disease)   . IBS (irritable bowel syndrome)   . PCOS (polycystic ovarian syndrome)     PAST SURGICAL HISTORY: No past surgical history on file.  SOCIAL HISTORY: Social History  Substance Use Topics  . Smoking status: Never Smoker  . Smokeless tobacco: Never Used  . Alcohol use No    FAMILY HISTORY: Family History  Problem Relation Age of Onset  . Adopted: Yes    ROS: Review of Systems  Constitutional: Negative for weight loss.  Cardiovascular: Negative for chest pain and claudication.  Gastrointestinal: Negative for nausea and vomiting.  Musculoskeletal: Negative for myalgias.  Endo/Heme/Allergies:       Negative muscle weakness Negative polyphagia    PHYSICAL EXAM: Blood pressure 104/71, pulse 86, temperature 97.6 F (36.4 C), temperature source Oral, height  (1.676 m), weight 217 lb (98.4 kg), SpO2 98 %. Body mass index is 35.02 kg/m. Physical Exam  Constitutional: She is oriented to person, place, and time. She appears well-developed and well-nourished.  Cardiovascular: Normal rate.   Pulmonary/Chest: Effort normal.   Musculoskeletal: Normal range of motion.  Neurological: She is oriented to person, place, and time.  Skin: Skin is warm and dry.  Psychiatric: She has a normal mood and affect. Her behavior is normal.  Vitals reviewed.   RECENT LABS AND TESTS: BMET    Component Value Date/Time   NA 140 12/01/2016 1051   K 4.6 12/01/2016 1051   CL 104 12/01/2016 1051   CO2 23 12/01/2016 1051   GLUCOSE 80 12/01/2016 1051   BUN 10 12/01/2016 1051   CREATININE 0.86 12/01/2016 1051   CALCIUM 9.6 12/01/2016 1051   GFRNONAA 95 12/01/2016 1051   GFRAA 109 12/01/2016 1051   Lab Results  Component Value Date   HGBA1C 5.2 12/01/2016   HGBA1C 5.2 08/19/2016   HGBA1C 5.4 05/15/2016   Lab Results  Component Value Date   INSULIN 24.6 12/01/2016   INSULIN 25.1 (H) 08/19/2016   INSULIN 30.2 (H) 05/15/2016   CBC    Component Value Date/Time   WBC 8.9 05/15/2016 1156   RBC 4.52 05/15/2016 1156   HGB 12.5 05/15/2016 1156   HCT 38.4 05/15/2016 1156   MCV 85 05/15/2016 1156   MCH 27.7 05/15/2016 1156   MCHC 32.6 05/15/2016 1156   RDW 14.1 05/15/2016 1156   LYMPHSABS 2.9 05/15/2016 1156   EOSABS 0.1 05/15/2016 1156   BASOSABS 0.1 05/15/2016 1156   Iron/TIBC/Ferritin/ %Sat No results found for: IRON, TIBC, FERRITIN, IRONPCTSAT Lipid Panel     Component Value Date/Time   CHOL 112 12/01/2016 1051   TRIG 136 12/01/2016 1051   HDL 33 (L) 12/01/2016 1051   LDLCALC 52 12/01/2016 1051   Hepatic Function Panel     Component Value Date/Time   PROT 7.6 12/01/2016 1051   ALBUMIN 4.6 12/01/2016 1051   AST 20 12/01/2016 1051   ALT 23 12/01/2016 1051   ALKPHOS 106 12/01/2016 1051   BILITOT 0.3 12/01/2016 1051      Component Value Date/Time   TSH 1.330 05/15/2016 1156    ASSESSMENT AND PLAN: Mixed hyperlipidemia - Plan: Lipid Panel With LDL/HDL Ratio  Vitamin D deficiency - Plan: VITAMIN D 25 Hydroxy (Vit-D Deficiency, Fractures)  Insulin resistance - Plan: Comprehensive metabolic panel,  Hemoglobin A1c, Insulin, random  At risk for diabetes mellitus  Class 2 severe obesity with serious comorbidity and body mass index (BMI) of 35.0 to 35.9 in adult, unspecified obesity type (HCC)  PLAN:  Hyperlipidemia Mixed Sayda was informed of the American Heart Association Guidelines emphasizing intensive lifestyle modifications as the first line treatment for hyperlipidemia. We discussed many lifestyle modifications today in depth, and Lidiya will continue to work on decreasing saturated fats such as fatty red meat, butter and many fried foods. She will also increase vegetables and lean protein in her diet and continue to work on exercise and weight loss efforts. We will recheck labs and Joni Reining agrees to follow up with our clinic in 4 weeks.  Vitamin D Deficiency Irie was informed that low vitamin D levels contributes to fatigue and  are associated with obesity, breast, and colon cancer. She agrees to continue to taking prescription Vit D ,000 IU every week and will follow up for routine testing of vitamin D, at least 2-3 times per year. She was informed of the risk of over-replacement of vitamin D and agrees to not increase her dose unless he discusses this with [place] first. Trixy will follow up with our clinic in 4 weeks.  Insulin Resistance Aulani will continue to work on weight loss, exercise, and decreasing simple carbohydrates in her diet to help decrease the risk of diabetes. We dicussed metformin including benefits and risks. She was informed that eating too many simple carbohydrates or too many calories at one sitting increases the likelihood of GI side effects. Mallissa declined metformin for now and prescription was not written today. We will recheck labs and Catriona agrees to follow up with our clinic in 4 weeks as directed to monitor her progress.  Diabetes risk counselling Almarosa was given extended (15 minutes) diabetes prevention counseling today. She is 26 y.o. female  and has risk factors for diabetes including obesity. We discussed intensive lifestyle modifications today with an emphasis on weight loss as well as increasing exercise and decreasing simple carbohydrates in her diet.   Obesity Barbar is currently in the action stage of change. As such, her goal is to continue with weight loss efforts She has agreed to change to keep a food journal with 1200 calories and 85 grams of protein daily Matsue has been instructed to work up to a goal of 150 minutes of combined cardio and strengthening exercise per week for weight loss and overall health benefits. We discussed the following Behavioral Modification Strategies today: increasing lean protein intake and keep a strict food journal   Lahna has agreed to follow up with our clinic in 4 weeks. She was informed of the importance of frequent follow up visits to maximize her success with intensive lifestyle modifications for her multiple health conditions.  I, Burt Knack, am acting as transcriptionist for Illa Level, PA-C  I have reviewed the above documentation for accuracy and completeness, and I agree with the above. -Illa Level, PA-C  I have reviewed the above note and agree with the plan. -Quillian Quince, MD     Today's visit was # 18 out of 22.  Starting weight: 217 lbs Starting date: 05/13/16 Today's weight : 217 lbs Today's date: 03/05/2017 Total lbs lost to date: 0 (Patients must lose 7 lbs in the first 6 months to continue with counseling)   ASK: We discussed the diagnosis of obesity with Einar Crow today and Addilyne agreed to give [place] permission to discuss obesity behavioral modification therapy today.  ASSESS: Caya has the diagnosis of obesity and her BMI today is 35 Seda is in the action stage of change   ADVISE: Rubyann was educated on the multiple health risks of obesity as well as the benefit of weight loss to improve her health. She was advised of the need for  long term treatment and the importance of lifestyle modifications.  AGREE: Multiple dietary modification options and treatment options were discussed and  Patric agreed to keep a food journal with 1200 calories and 85 grams of  protein daily We discussed the following Behavioral Modification Strategies today: increasing lean protein intake and keep a strict food journal

## 2017-03-06 LAB — COMPREHENSIVE METABOLIC PANEL
A/G RATIO: 1.6 (ref 1.2–2.2)
ALT: 20 IU/L (ref 0–32)
AST: 18 IU/L (ref 0–40)
Albumin: 4.2 g/dL (ref 3.5–5.5)
Alkaline Phosphatase: 86 IU/L (ref 39–117)
BILIRUBIN TOTAL: 0.3 mg/dL (ref 0.0–1.2)
BUN/Creatinine Ratio: 18 (ref 9–23)
BUN: 14 mg/dL (ref 6–20)
CALCIUM: 9.3 mg/dL (ref 8.7–10.2)
CHLORIDE: 103 mmol/L (ref 96–106)
CO2: 22 mmol/L (ref 20–29)
Creatinine, Ser: 0.78 mg/dL (ref 0.57–1.00)
GFR calc Af Amer: 123 mL/min/{1.73_m2} (ref >59)
GFR calc non Af Amer: 107 mL/min/{1.73_m2} (ref >59)
GLUCOSE: 90 mg/dL (ref 65–99)
Globulin, Total: 2.7 g/dL (ref 1.5–4.5)
POTASSIUM: 4.2 mmol/L (ref 3.5–5.2)
Sodium: 139 mmol/L (ref 134–144)
TOTAL PROTEIN: 6.9 g/dL (ref 6.0–8.5)

## 2017-03-06 LAB — LIPID PANEL WITH LDL/HDL RATIO
CHOLESTEROL TOTAL: 105 mg/dL (ref 100–199)
HDL: 25 mg/dL — ABNORMAL LOW (ref >39)
LDL Calculated: 25 mg/dL (ref 0–99)
LDl/HDL Ratio: 1 ratio (ref 0.0–3.2)
Triglycerides: 277 mg/dL — ABNORMAL HIGH (ref 0–149)
VLDL Cholesterol Cal: 55 mg/dL — ABNORMAL HIGH (ref 5–40)

## 2017-03-06 LAB — INSULIN, RANDOM: INSULIN: 32.1 u[IU]/mL — AB (ref 2.6–24.9)

## 2017-03-06 LAB — HEMOGLOBIN A1C
ESTIMATED AVERAGE GLUCOSE: 97 mg/dL
Hgb A1c MFr Bld: 5 % (ref 4.8–5.6)

## 2017-03-06 LAB — VITAMIN D 25 HYDROXY (VIT D DEFICIENCY, FRACTURES): VIT D 25 HYDROXY: 30.3 ng/mL (ref 30.0–100.0)

## 2017-03-09 ENCOUNTER — Ambulatory Visit (INDEPENDENT_AMBULATORY_CARE_PROVIDER_SITE_OTHER): Payer: No Typology Code available for payment source | Admitting: Clinical

## 2017-03-09 DIAGNOSIS — F411 Generalized anxiety disorder: Secondary | ICD-10-CM

## 2017-03-18 ENCOUNTER — Ambulatory Visit (INDEPENDENT_AMBULATORY_CARE_PROVIDER_SITE_OTHER): Payer: No Typology Code available for payment source | Admitting: Dietician

## 2017-03-18 VITALS — Ht 66.0 in | Wt 218.0 lb

## 2017-03-18 DIAGNOSIS — Z9189 Other specified personal risk factors, not elsewhere classified: Secondary | ICD-10-CM | POA: Diagnosis not present

## 2017-03-18 DIAGNOSIS — E8881 Metabolic syndrome: Secondary | ICD-10-CM | POA: Diagnosis not present

## 2017-03-18 DIAGNOSIS — Z6835 Body mass index (BMI) 35.0-35.9, adult: Secondary | ICD-10-CM

## 2017-03-18 NOTE — Progress Notes (Signed)
  Office: [phone]  /  Fax: [phone]     Arlina Robesichole has a diagnosis of insulin resistance based on her elevated fasting insulin level >5. Although Keller's blood glucose readings are still under good control, insulin resistance puts her at greater risk of metabolic syndrome and diabetes.  She is here today for diabetes prevention nutrition counseling. She admits she is deviating from her category treatment plan, ie skipping breakfast and making substitutions at lunch. Joni Reiningicole continues to be more mindful with her meal choices and reports trying new foods to increase variety in meal choices. She also reports eating out less often.  Her weight is 218 lbs today, (BMI 35.2)  a 1 lb wt gain since her last visit however she is retaining some fluid today. She reports walking 15 min 3 days per week.    Patient was educated about food nutrients ie protein, fats, simple and complex carbohydrates and how these affect insulin response. Provided Joni Reiningicole with some lunch options with a focus on portion control,  avoiding simple carbohydrates and lower fat foods for ongoing weight loss. Also reminded to avoid skipping meals.   Rozella is on the following meal plan Category 2. Her meal plan was individualized for maximum benefit.  Also discussed at length the following behavioral modifications to help maximize success increasing lean protein intake, decreasing simple carbohydrates, avoiding skipping meals,  avoiding temptations, planning for success, keeping a strict food journal, and decreasing junk food.    Claramae has been instructed to work up to a goal of 150 minutes of combined cardio and strengthening exercise per week for weight loss and overall health benefits.

## 2017-03-23 ENCOUNTER — Ambulatory Visit (INDEPENDENT_AMBULATORY_CARE_PROVIDER_SITE_OTHER): Payer: No Typology Code available for payment source | Admitting: Physician Assistant

## 2017-03-23 VITALS — BP 109/77 | HR 100 | Temp 97.7°F | Ht 66.0 in | Wt 217.0 lb

## 2017-03-23 DIAGNOSIS — K5900 Constipation, unspecified: Secondary | ICD-10-CM

## 2017-03-23 DIAGNOSIS — Z9189 Other specified personal risk factors, not elsewhere classified: Secondary | ICD-10-CM | POA: Diagnosis not present

## 2017-03-23 DIAGNOSIS — Z6835 Body mass index (BMI) 35.0-35.9, adult: Secondary | ICD-10-CM

## 2017-03-23 DIAGNOSIS — E781 Pure hyperglyceridemia: Secondary | ICD-10-CM | POA: Diagnosis not present

## 2017-03-23 DIAGNOSIS — E559 Vitamin D deficiency, unspecified: Secondary | ICD-10-CM | POA: Diagnosis not present

## 2017-03-23 MED ORDER — VITAMIN D (ERGOCALCIFEROL) 1.25 MG (50000 UNIT) PO CAPS: 50000.0000 [IU] | ORAL_CAPSULE | ORAL | 0 refills | Status: DC

## 2017-03-23 MED ORDER — POLYETHYLENE GLYCOL 3350 17 GM/SCOOP PO POWD: 17.0000 g | Freq: Every day | ORAL | 0 refills | Status: DC

## 2017-03-23 NOTE — Progress Notes (Addendum)
Office: [phone]  /  Fax: [phone]   HPI:   Chief Complaint: OBESITY Arlina Robesichole is here to discuss her progress with her obesity treatment plan. She is on the Category 2 plan and is following her eating plan approximately 100 % of the time. She states she is exercising 0 minutes 0 times per week. Delayna maintained her weight. She states she is becoming more mindful of her eating, and controls her portions. She still has challenges getting her daily required protein.  She would like more meal planning ideas. Her weight is 217 lb (98.4 kg) today and has maintained weight over a period of 2 weeks since her last visit. She has lost 0 lbs since starting treatment with us.  Vitamin D deficiency Swayze has a diagnosis of vitamin D deficiency. She is currently taking vit D and denies nausea, vomiting or muscle weakness.  At risk for osteopenia and osteoporosis Karlynn is at higher risk of osteopenia and osteoporosis due to vitamin D deficiency.   Hypertriglyceridemia Katelyn has hypertriglyceridemia and has been trying to improve her cholesterol levels with intensive lifestyle modification including a low saturated fat diet, exercise and weight loss. She denies any chest pain, claudication or myalgias. She is offered medicines that lowers triglycerides but she declines today.    ALLERGIES: No Known Allergies  MEDICATIONS: Current Outpatient Prescriptions on File Prior to Visit  Medication Sig Dispense Refill  . beclomethasone (QVAR) 80 MCG/ACT inhaler Inhale 1 puff into the lungs as needed.    . clonazePAM (KLONOPIN) 0.5 MG tablet Take 0.5 mg by mouth 2 (two) times daily as needed for anxiety (Take one tablet at bedtime and one half tablet every morning).     Marland Kitchen. diclofenac (VOLTAREN) 75 MG EC tablet Take 1 tablet (75 mg total) by mouth 2 (two) times daily. 60 tablet 1  . Insulin Pen Needle 32G X 4 MM MISC 1 each by Does not apply route daily. 100 each 0  . lisdexamfetamine (VYVANSE)  50 MG capsule Take 50 mg by mouth daily.    . ondansetron (ZOFRAN ODT) 4 MG disintegrating tablet Take one tab by mouth Q6hr prn nausea (Dissolve under tongue) 12 tablet 0   No current facility-administered medications on file prior to visit.     PAST MEDICAL HISTORY: Past Medical History:  Diagnosis Date  . ADHD   . Anxiety   . Asthma   . Depression   . GERD (gastroesophageal reflux disease)   . IBS (irritable bowel syndrome)   . PCOS (polycystic ovarian syndrome)     PAST SURGICAL HISTORY: No past surgical history on file.  SOCIAL HISTORY: Social History  Substance Use Topics  . Smoking status: Never Smoker  . Smokeless tobacco: Never Used  . Alcohol use No    FAMILY HISTORY: Family History  Problem Relation Age of Onset  . Adopted: Yes    ROS: Review of Systems  Constitutional: Negative for weight loss.  Gastrointestinal: Negative for constipation, melena, nausea and vomiting.       Negative hematochezia  Musculoskeletal:       Negative muscle weakness    PHYSICAL EXAM: Blood pressure 109/77, pulse 100, temperature 97.7 F (36.5 C), temperature source Oral, height 5\' 6"  (1.676 m), weight 217 lb (98.4 kg), SpO2 94 %. Body mass index is 35.02 kg/m. Physical Exam  Constitutional: She is oriented to person, place, and time. She appears well-developed and well-nourished.  Cardiovascular: Normal rate.   Pulmonary/Chest: Effort normal.  Musculoskeletal: Normal range of  motion.  Neurological: She is oriented to person, place, and time.  Skin: Skin is warm and dry.  Psychiatric: She has a normal mood and affect. Her behavior is normal.  Vitals reviewed.   RECENT LABS AND TESTS: BMET    Component Value Date/Time   NA 139 03/05/2017 1055   K 4.2 03/05/2017 1055   CL 103 03/05/2017 1055   CO2 22 03/05/2017 1055   GLUCOSE 90 03/05/2017 1055   BUN 14 03/05/2017 1055   CREATININE 0.78 03/05/2017 1055   CALCIUM 9.3 03/05/2017 1055   GFRNONAA 107 03/05/2017  1055   GFRAA 123 03/05/2017 1055   Lab Results  Component Value Date   HGBA1C 5.0 03/05/2017   HGBA1C 5.2 12/01/2016   HGBA1C 5.2 08/19/2016   HGBA1C 5.4 05/15/2016   Lab Results  Component Value Date   INSULIN 32.1 (H) 03/05/2017   INSULIN 24.6 12/01/2016   INSULIN 25.1 (H) 08/19/2016   INSULIN 30.2 (H) 05/15/2016   CBC    Component Value Date/Time   WBC 8.9 05/15/2016 1156   RBC 4.52 05/15/2016 1156   HGB 12.5 05/15/2016 1156   HCT 38.4 05/15/2016 1156   MCV 85 05/15/2016 1156   MCH 27.7 05/15/2016 1156   MCHC 32.6 05/15/2016 1156   RDW 14.1 05/15/2016 1156   LYMPHSABS 2.9 05/15/2016 1156   EOSABS 0.1 05/15/2016 1156   BASOSABS 0.1 05/15/2016 1156   Iron/TIBC/Ferritin/ %Sat No results found for: IRON, TIBC, FERRITIN, IRONPCTSAT Lipid Panel     Component Value Date/Time   CHOL 105 03/05/2017 1055   TRIG 277 (H) 03/05/2017 1055   HDL 25 (L) 03/05/2017 1055   LDLCALC 25 03/05/2017 1055   Hepatic Function Panel     Component Value Date/Time   PROT 6.9 03/05/2017 1055   ALBUMIN 4.2 03/05/2017 1055   AST 18 03/05/2017 1055   ALT 20 03/05/2017 1055   ALKPHOS 86 03/05/2017 1055   BILITOT 0.3 03/05/2017 1055      Component Value Date/Time   TSH 1.330 05/15/2016 1156    ASSESSMENT AND PLAN: Vitamin D deficiency - Plan: Vitamin D, Ergocalciferol, (DRISDOL) 50000 units CAPS capsule  At risk for osteoporosis  Hypertriglyceridemia  Class 2 severe obesity with serious comorbidity and body mass index (BMI) of 35.0 to 35.9 in adult, unspecified obesity type (HCC)  PLAN:  Vitamin D Deficiency Imya was informed that low vitamin D levels contributes to fatigue and are associated with obesity, breast, and colon cancer. She agrees to continue to take prescription Vit D @50 ,000 IU every week #4 with no refills and will follow up for routine testing of vitamin D, at least 2-3 times per year. She was informed of the risk of over-replacement of vitamin D and agrees to  not increase her dose unless he discusses this with [place] first. Hoang agrees to follow up with our clinic in 2 weeks.  At risk for osteopenia and osteoporosis Hatsumi is at risk for osteopenia and osteoporosis due to her vitamin D deficiency. She was encouraged to take her vitamin D and follow her higher calcium diet and increase strengthening exercise to help strengthen her bones and decrease her risk of osteopenia and osteoporosis.  Hypertriglyceridemia Mylene was informed of the American Heart Association Guidelines emphasizing intensive lifestyle modifications as the first line treatment for hypertriglyceridemia. We discussed many lifestyle modifications today in depth, and Carmie will continue to work on decreasing saturated fats such as fatty red meat, butter and many fried foods. She will  also increase vegetables and lean protein in her diet and continue to work on exercise and weight loss efforts.  Obesity Dimple is currently in the action stage of change. As such, her goal is to continue with weight loss efforts She has agreed to keep a food journal with 1200 calories and 90 grams of protein daily Nico has been instructed to work up to a goal of 150 minutes of combined cardio and strengthening exercise per week for weight loss and overall health benefits. We discussed the following Behavioral Modification Strategies today: increasing lean protein intake and work on meal planning and easy cooking plans  Alaiza has agreed to follow up with our clinic in 2 weeks. She was informed of the importance of frequent follow up visits to maximize her success with intensive lifestyle modifications for her multiple health conditions.  I, [place] Crane, am acting as transcriptionist for Illa Level, PA-C  I have reviewed the above documentation for accuracy and completeness, and I agree with the above. -Illa Level, PA-C  I have reviewed the above note and agree with the plan. -Quillian Quince,  MD   OBESITY BEHAVIORAL INTERVENTION VISIT  Today's visit was # 19 out of 22.  Starting weight: 217 lbs Starting date: 05/13/16 Today's weight : 217 lbs Today's date: 03/24/2017 Total lbs lost to date: 0 (Patients must lose 7 lbs in the first 6 months to continue with counseling)   ASK: We discussed the diagnosis of obesity with Einar Crow today and Melisia agreed to give [place] permission to discuss obesity behavioral modification therapy today.  ASSESS: Risha has the diagnosis of obesity and her BMI today is 35.04 Tationa is in the action stage of change   ADVISE: Cythia was educated on the multiple health risks of obesity as well as the benefit of weight loss to improve her health. She was advised of the need for long term treatment and the importance of lifestyle modifications.  AGREE: Multiple dietary modification options and treatment options were discussed and  Tayloranne agreed to keep a food journal with 1200' calories and 90 grams of protein daily We discussed the following Behavioral Modification Strategies today: increasing lean protein intake and work on meal planning and easy cooking plans    Addendum11/15/18: Diagnosis of Constipation was discussed with patient on 03/23/17 but was not added to chart.   Constipation  HPI Lyndsay notes constipation on previous visits, but states her bowels are now normal and she has not required to use the Miralax.  ROS Pt denies hard/painful stools, hematochezia, or melena.  Physical Exam  Please see above   Plan Joliene was informed decrease bowel movement frequency is normal while losing weight, but stools should not be hard or painful. She was advised to increase her H20 intake and work on increasing her fiber intake. High fiber foods were discussed today. Joni Reining is advised to use Miralax as needed, on if she has recurrent constipation.   This addendum was created by Illa Level PAC on 04/09/17

## 2017-03-24 MED FILL — POLYETHYLENE GLYCOL 3350 PO: 30 days supply | Qty: 527 | Fill #0

## 2017-03-24 MED FILL — VIT D2 1.25 MG (50,000 UNIT: 1.25 MG | 28 days supply | Qty: 4 | Fill #0

## 2017-04-06 ENCOUNTER — Ambulatory Visit: Payer: No Typology Code available for payment source | Admitting: Clinical

## 2017-04-06 DIAGNOSIS — F411 Generalized anxiety disorder: Secondary | ICD-10-CM | POA: Diagnosis not present

## 2017-04-09 ENCOUNTER — Ambulatory Visit (INDEPENDENT_AMBULATORY_CARE_PROVIDER_SITE_OTHER): Payer: No Typology Code available for payment source | Admitting: Physician Assistant

## 2017-04-09 VITALS — BP 102/69 | HR 89 | Temp 97.7°F | Ht 66.0 in | Wt 213.0 lb

## 2017-04-09 DIAGNOSIS — E559 Vitamin D deficiency, unspecified: Secondary | ICD-10-CM | POA: Diagnosis not present

## 2017-04-09 DIAGNOSIS — E669 Obesity, unspecified: Secondary | ICD-10-CM

## 2017-04-09 DIAGNOSIS — Z6834 Body mass index (BMI) 34.0-34.9, adult: Secondary | ICD-10-CM | POA: Diagnosis not present

## 2017-04-09 NOTE — Progress Notes (Signed)
Office: [phone]  /  Fax: [phone]   HPI:   Chief Complaint: OBESITY Laura Perez is here to discuss her progress with her obesity treatment plan. She is on the Category 2 plan and is following her eating plan approximately 100 % of the time. She states she is walking for 7 minutes 3 times per week. Laura Perez continues to do well with weight loss. She is becoming more mindful of her eating and states her hunger is well controlled. She continues to work on decreasing her eating out. Her weight is 213 lb (96.6 kg) today and has had a weight loss of 4 pounds over a period of 2 weeks since her last visit. She has lost 4 lbs since starting treatment with [place].  Vitamin D deficiency Laura Perez has a diagnosis of vitamin D deficiency. She is currently taking vit D and denies nausea, vomiting or muscle weakness.  ALLERGIES: No Known Allergies  MEDICATIONS: Current Outpatient Medications on File Prior to Visit  Medication Sig Dispense Refill  . beclomethasone (QVAR) 80 MCG/ACT inhaler Inhale 1 puff into the lungs as needed.    . clonazePAM (KLONOPIN) 0.5 MG tablet Take 0.5 mg 2 (two) times daily by mouth.     . diclofenac (VOLTAREN) 75 MG EC tablet Take 1 tablet (75 mg total) by mouth 2 (two) times daily. 60 tablet 1  . lisdexamfetamine (VYVANSE) 50 MG capsule Take 50 mg by mouth daily.    . ondansetron (ZOFRAN ODT) 4 MG disintegrating tablet Take one tab by mouth Q6hr prn nausea (Dissolve under tongue) 12 tablet 0  . Vitamin D, Ergocalciferol, (DRISDOL) 50000 units CAPS capsule Take 1 capsule (50,000 Units total) by mouth every 7 (seven) days. 4 capsule 0   No current facility-administered medications on file prior to visit.     PAST MEDICAL HISTORY: Past Medical History:  Diagnosis Date  . ADHD   . Anxiety   . Asthma   . Depression   . GERD (gastroesophageal reflux disease)   . IBS (irritable bowel syndrome)   . PCOS (polycystic ovarian syndrome)     PAST SURGICAL HISTORY: No past  surgical history on file.  SOCIAL HISTORY: Social History   Tobacco Use  . Smoking status: Never Smoker  . Smokeless tobacco: Never Used  Substance Use Topics  . Alcohol use: No    Alcohol/week: 0.0 oz  . Drug use: Not on file    FAMILY HISTORY: Family History  Adopted: Yes    ROS: Review of Systems  Constitutional: Positive for weight loss.  Gastrointestinal: Negative for nausea and vomiting.  Musculoskeletal:       Negative muscle weakness    PHYSICAL EXAM: Blood pressure 102/69, pulse 89, temperature 97.7 F (36.5 C), temperature source Oral, height 5\' 6"  (1.676 m), weight 213 lb (96.6 kg), SpO2 96 %. Body mass index is 34.38 kg/m. Physical Exam  Constitutional: She is oriented to person, place, and time. She appears well-developed and well-nourished.  Cardiovascular: Normal rate.  Pulmonary/Chest: Effort normal.  Musculoskeletal: Normal range of motion.  Neurological: She is oriented to person, place, and time.  Skin: Skin is warm and dry.  Psychiatric: She has a normal mood and affect. Her behavior is normal.  Vitals reviewed.   RECENT LABS AND TESTS: BMET    Component Value Date/Time   NA 139 03/05/2017 1055   K 4.2 03/05/2017 1055   CL 103 03/05/2017 1055   CO2 22 03/05/2017 1055   GLUCOSE 90 03/05/2017 1055   BUN  14 03/05/2017 1055   CREATININE 0.78 03/05/2017 1055   CALCIUM 9.3 03/05/2017 1055   GFRNONAA 107 03/05/2017 1055   GFRAA 123 03/05/2017 1055   Lab Results  Component Value Date   HGBA1C 5.0 03/05/2017   HGBA1C 5.2 12/01/2016   HGBA1C 5.2 08/19/2016   HGBA1C 5.4 05/15/2016   Lab Results  Component Value Date   INSULIN 32.1 (H) 03/05/2017   INSULIN 24.6 12/01/2016   INSULIN 25.1 (H) 08/19/2016   INSULIN 30.2 (H) 05/15/2016   CBC    Component Value Date/Time   WBC 8.9 05/15/2016 1156   RBC 4.52 05/15/2016 1156   HGB 12.5 05/15/2016 1156   HCT 38.4 05/15/2016 1156   MCV 85 05/15/2016 1156   MCH 27.7 05/15/2016 1156   MCHC  32.6 05/15/2016 1156   RDW 14.1 05/15/2016 1156   LYMPHSABS 2.9 05/15/2016 1156   EOSABS 0.1 05/15/2016 1156   BASOSABS 0.1 05/15/2016 1156   Iron/TIBC/Ferritin/ %Sat No results found for: IRON, TIBC, FERRITIN, IRONPCTSAT Lipid Panel     Component Value Date/Time   CHOL 105 03/05/2017 1055   TRIG 277 (H) 03/05/2017 1055   HDL 25 (L) 03/05/2017 1055   LDLCALC 25 03/05/2017 1055   Hepatic Function Panel     Component Value Date/Time   PROT 6.9 03/05/2017 1055   ALBUMIN 4.2 03/05/2017 1055   AST 18 03/05/2017 1055   ALT 20 03/05/2017 1055   ALKPHOS 86 03/05/2017 1055   BILITOT 0.3 03/05/2017 1055      Component Value Date/Time   TSH 1.330 05/15/2016 1156    ASSESSMENT AND PLAN: Vitamin D deficiency  Class 1 obesity with serious comorbidity and body mass index (BMI) of 34.0 to 34.9 in adult, unspecified obesity type  PLAN:  Vitamin D Deficiency Laura Perez was informed that low vitamin D levels contributes to fatigue and are associated with obesity, breast, and colon cancer. She agrees to continue to take prescription Vit D @50 ,000 IU every week and will follow up for routine testing of vitamin D, at least 2-3 times per year. She was informed of the risk of over-replacement of vitamin D and agrees to not increase her dose unless he discusses this with us first.  We spent > than 50% of the 15 minute visit on the counseling as documented in the note.  Obesity Laura Perez is currently in the action stage of change. As such, her goal is to continue with weight loss efforts She has agreed to keep a food journal with 1200 calories and 90 grams of protein daily Laura Perez has been instructed to work up to a goal of 150 minutes of combined cardio and strengthening exercise per week for weight loss and overall health benefits. We discussed the following Behavioral Modification Strategies today: increasing lean protein intake, decrease eating out and holiday eating strategies   Laura Perez has  agreed to follow up with our clinic in 3 weeks. She was informed of the importance of frequent follow up visits to maximize her success with intensive lifestyle modifications for her multiple health conditions.  I, [place] CraneJoanne Murray, am acting as transcriptionist for Laura LevelSahar Osman, PA-C  I have reviewed the above documentation for accuracy and completeness, and I agree with the above. -Laura LevelSahar Osman, PA-C  I have reviewed the above note and agree with the plan. -Laura Quincearen Beasley, MD   OBESITY BEHAVIORAL INTERVENTION VISIT  Today's visit was # 20 out of 22.  Starting weight: 217 lbs Starting date: 05/13/16 Today's weight : 213 lbs  Today's date: 04/09/2017 Total lbs lost to date: 4 (Patients must lose 7 lbs in the first 6 months to continue with counseling)   ASK: We discussed the diagnosis of obesity with Laura Perez today and Laura Perez agreed to give us permission to discuss obesity behavioral modification therapy today.  ASSESS: Laura Perez has the diagnosis of obesity and her BMI today is 34.4 Laura Perez is in the action stage of change   ADVISE: Laura Perez was educated on the multiple health risks of obesity as well as the benefit of weight loss to improve her health. She was advised of the need for long term treatment and the importance of lifestyle modifications.  AGREE: Multiple dietary modification options and treatment options were discussed and  Laura Perez agreed to keep a food journal with 1200 calories and 90 grams of protein daily We discussed the following Behavioral Modification Strategies today: increasing lean protein intake, decrease eating out and holiday eating strategies

## 2017-04-14 MED FILL — VIT D2 1.25 MG (50,000 UNIT: 1.25 MG | 28 days supply | Qty: 4 | Fill #0

## 2017-04-14 MED FILL — clonazePAM 0.5 MG TABS: 0.5 | 30 days supply | Qty: 60 | Fill #0

## 2017-05-04 ENCOUNTER — Ambulatory Visit (INDEPENDENT_AMBULATORY_CARE_PROVIDER_SITE_OTHER): Payer: No Typology Code available for payment source | Admitting: Physician Assistant

## 2017-05-06 ENCOUNTER — Ambulatory Visit: Payer: No Typology Code available for payment source | Admitting: Clinical

## 2017-05-28 ENCOUNTER — Ambulatory Visit (INDEPENDENT_AMBULATORY_CARE_PROVIDER_SITE_OTHER): Payer: No Typology Code available for payment source | Admitting: Clinical

## 2017-05-28 DIAGNOSIS — F411 Generalized anxiety disorder: Secondary | ICD-10-CM | POA: Diagnosis not present

## 2017-06-10 ENCOUNTER — Ambulatory Visit (INDEPENDENT_AMBULATORY_CARE_PROVIDER_SITE_OTHER): Payer: No Typology Code available for payment source | Admitting: Physician Assistant

## 2017-06-10 ENCOUNTER — Encounter (INDEPENDENT_AMBULATORY_CARE_PROVIDER_SITE_OTHER): Payer: Self-pay

## 2017-06-12 ENCOUNTER — Ambulatory Visit: Payer: No Typology Code available for payment source | Admitting: Clinical

## 2017-06-12 DIAGNOSIS — F411 Generalized anxiety disorder: Secondary | ICD-10-CM

## 2017-06-18 ENCOUNTER — Ambulatory Visit (INDEPENDENT_AMBULATORY_CARE_PROVIDER_SITE_OTHER): Payer: No Typology Code available for payment source | Admitting: Physician Assistant

## 2017-06-18 VITALS — BP 109/73 | HR 89 | Temp 98.4°F | Ht 66.0 in | Wt 209.0 lb

## 2017-06-18 DIAGNOSIS — Z6833 Body mass index (BMI) 33.0-33.9, adult: Secondary | ICD-10-CM

## 2017-06-18 DIAGNOSIS — E669 Obesity, unspecified: Secondary | ICD-10-CM | POA: Insufficient documentation

## 2017-06-18 DIAGNOSIS — E559 Vitamin D deficiency, unspecified: Secondary | ICD-10-CM | POA: Diagnosis not present

## 2017-06-18 NOTE — Progress Notes (Signed)
Office: [phone]  /  Fax: [phone]   HPI:   Chief Complaint: OBESITY Laura Perez is here to discuss her progress with her obesity treatment plan. She is on the keep a food journal with 1200 calories and 90 grams of protein daily and is following her eating plan approximately 100 % of the time. She states she is exercising 60+ minutes 3 times per week. Lizzett continues to do well with weight loss. She is more mindful of her eating and has been drinking less high calorie drinks. Her weight is 209 lb (94.8 kg) today and has had a weight loss of 4 pounds over a period of 10 weeks since her last visit. She has lost 8 lbs since starting treatment with us.  Vitamin D deficiency Laura Perez has a diagnosis of vitamin D deficiency. She is currently taking vit D and denies nausea, vomiting or muscle weakness.   Ref. Range 03/05/2017 10:55  Vitamin D, 25-Hydroxy Latest Ref Range: 30.0 - 100.0 ng/mL 30.3    ALLERGIES: No Known Allergies  MEDICATIONS: Current Outpatient Medications on File Prior to Visit  Medication Sig Dispense Refill  . beclomethasone (QVAR) 80 MCG/ACT inhaler Inhale 1 puff into the lungs as needed.    . clonazePAM (KLONOPIN) 0.5 MG tablet Take 0.5 mg 2 (two) times daily by mouth.     . diclofenac (VOLTAREN) 75 MG EC tablet Take 1 tablet (75 mg total) by mouth 2 (two) times daily. 60 tablet 1  . lisdexamfetamine (VYVANSE) 50 MG capsule Take 50 mg by mouth daily.    . nortriptyline (PAMELOR) 10 MG capsule Take 10 mg by mouth at bedtime.    . ondansetron (ZOFRAN ODT) 4 MG disintegrating tablet Take one tab by mouth Q6hr prn nausea (Dissolve under tongue) 12 tablet 0  . Vitamin D, Ergocalciferol, (DRISDOL) 50000 units CAPS capsule Take 1 capsule (50,000 Units total) by mouth every 7 (seven) days. 4 capsule 0   No current facility-administered medications on file prior to visit.     PAST MEDICAL HISTORY: Past Medical History:  Diagnosis Date  . ADHD   . Anxiety   .  Asthma   . Depression   . GERD (gastroesophageal reflux disease)   . IBS (irritable bowel syndrome)   . PCOS (polycystic ovarian syndrome)     PAST SURGICAL HISTORY: No past surgical history on file.  SOCIAL HISTORY: Social History   Tobacco Use  . Smoking status: Never Smoker  . Smokeless tobacco: Never Used  Substance Use Topics  . Alcohol use: No    Alcohol/week: 0.0 oz  . Drug use: Not on file    FAMILY HISTORY: Family History  Adopted: Yes    ROS: Review of Systems  Constitutional: Positive for weight loss.  Gastrointestinal: Negative for nausea and vomiting.  Musculoskeletal:       Negative muscle weakness    PHYSICAL EXAM: Blood pressure 109/73, pulse 89, temperature 98.4 F (36.9 C), temperature source Oral, height 5\' 6"  (1.676 m), weight 209 lb (94.8 kg), SpO2 98 %. Body mass index is 33.73 kg/m. Physical Exam  Constitutional: She is oriented to person, place, and time. She appears well-developed and well-nourished.  Cardiovascular: Normal rate.  Pulmonary/Chest: Effort normal.  Musculoskeletal: Normal range of motion.  Neurological: She is oriented to person, place, and time.  Skin: Skin is warm and dry.  Psychiatric: She has a normal mood and affect. Her behavior is normal.  Vitals reviewed.   RECENT LABS AND TESTS: BMET  Component Value Date/Time   NA 139 03/05/2017 1055   K 4.2 03/05/2017 1055   CL 103 03/05/2017 1055   CO2 22 03/05/2017 1055   GLUCOSE 90 03/05/2017 1055   BUN 14 03/05/2017 1055   CREATININE 0.78 03/05/2017 1055   CALCIUM 9.3 03/05/2017 1055   GFRNONAA 107 03/05/2017 1055   GFRAA 123 03/05/2017 1055   Lab Results  Component Value Date   HGBA1C 5.0 03/05/2017   HGBA1C 5.2 12/01/2016   HGBA1C 5.2 08/19/2016   HGBA1C 5.4 05/15/2016   Lab Results  Component Value Date   INSULIN 32.1 (H) 03/05/2017   INSULIN 24.6 12/01/2016   INSULIN 25.1 (H) 08/19/2016   INSULIN 30.2 (H) 05/15/2016   CBC    Component Value  Date/Time   WBC 8.9 05/15/2016 1156   RBC 4.52 05/15/2016 1156   HGB 12.5 05/15/2016 1156   HCT 38.4 05/15/2016 1156   MCV 85 05/15/2016 1156   MCH 27.7 05/15/2016 1156   MCHC 32.6 05/15/2016 1156   RDW 14.1 05/15/2016 1156   LYMPHSABS 2.9 05/15/2016 1156   EOSABS 0.1 05/15/2016 1156   BASOSABS 0.1 05/15/2016 1156   Iron/TIBC/Ferritin/ %Sat No results found for: IRON, TIBC, FERRITIN, IRONPCTSAT Lipid Panel     Component Value Date/Time   CHOL 105 03/05/2017 1055   TRIG 277 (H) 03/05/2017 1055   HDL 25 (L) 03/05/2017 1055   LDLCALC 25 03/05/2017 1055   Hepatic Function Panel     Component Value Date/Time   PROT 6.9 03/05/2017 1055   ALBUMIN 4.2 03/05/2017 1055   AST 18 03/05/2017 1055   ALT 20 03/05/2017 1055   ALKPHOS 86 03/05/2017 1055   BILITOT 0.3 03/05/2017 1055      Component Value Date/Time   TSH 1.330 05/15/2016 1156    ASSESSMENT AND PLAN: Vitamin D deficiency  Class 1 obesity with serious comorbidity and body mass index (BMI) of 33.0 to 33.9 in adult, unspecified obesity type  PLAN:  Vitamin D Deficiency [name] was informed that low vitamin D levels contributes to fatigue and are associated with obesity, breast, and colon cancer. She agrees to continue to take prescription Vit D @50 ,000 IU every week and will follow up for routine testing of vitamin D, at least 2-3 times per year. She was informed of the risk of over-replacement of vitamin D and agrees to not increase her dose unless she discusses this with [place] first.  We spent > than 50% of the 15 minute visit on the counseling as documented in the note.  Obesity Genie is currently in the action stage of change. As such, her goal is to continue with weight loss efforts She has agreed to keep a food journal with 1200 calories and 90 grams of protein daily [name] has been instructed to work up to a goal of 150 minutes of combined cardio and strengthening exercise per week for weight loss and overall  health benefits. We discussed the following Behavioral Modification Strategies today: increasing lean protein intake and work on meal planning and easy cooking plans  [name] has agreed to follow up with our clinic in 3 weeks. She was informed of the importance of frequent follow up visits to maximize her success with intensive lifestyle modifications for her multiple health conditions.   OBESITY BEHAVIORAL INTERVENTION VISIT  Today's visit was # 21 out of 22.  Starting weight: 217 lbs Starting date: 05/13/16 Today's weight : 209 lbs Today's date: 06/18/2017 Total lbs lost to date: 8 (Patients  must lose 7 lbs in the first 6 months to continue with counseling)   ASK: We discussed the diagnosis of obesity with Einar Crow today and Sam agreed to give [place] permission to discuss obesity behavioral modification therapy today.  ASSESS: Melodee has the diagnosis of obesity and her BMI today is 33.75 Ledonna is in the action stage of change   ADVISE: Salah was educated on the multiple health risks of obesity as well as the benefit of weight loss to improve her health. She was advised of the need for long term treatment and the importance of lifestyle modifications.  AGREE: Multiple dietary modification options and treatment options were discussed and  Alfreida agreed to the above obesity treatment plan.   Cristi Loron, am acting as transcriptionist for [employer], PA-C I, Illa Level [facility], have reviewed this note and agree with its contents.

## 2017-06-23 ENCOUNTER — Ambulatory Visit: Payer: No Typology Code available for payment source | Admitting: Clinical

## 2017-06-23 DIAGNOSIS — F411 Generalized anxiety disorder: Secondary | ICD-10-CM

## 2017-06-25 ENCOUNTER — Encounter (INDEPENDENT_AMBULATORY_CARE_PROVIDER_SITE_OTHER): Payer: Self-pay | Admitting: Physician Assistant

## 2017-07-07 ENCOUNTER — Ambulatory Visit (INDEPENDENT_AMBULATORY_CARE_PROVIDER_SITE_OTHER): Payer: No Typology Code available for payment source | Admitting: Clinical

## 2017-07-07 DIAGNOSIS — F411 Generalized anxiety disorder: Secondary | ICD-10-CM

## 2017-07-14 ENCOUNTER — Ambulatory Visit (INDEPENDENT_AMBULATORY_CARE_PROVIDER_SITE_OTHER): Payer: No Typology Code available for payment source | Admitting: Physician Assistant

## 2017-07-14 VITALS — BP 100/67 | HR 90 | Temp 98.1°F | Ht 66.0 in | Wt 209.0 lb

## 2017-07-14 DIAGNOSIS — E559 Vitamin D deficiency, unspecified: Secondary | ICD-10-CM | POA: Diagnosis not present

## 2017-07-14 DIAGNOSIS — Z6833 Body mass index (BMI) 33.0-33.9, adult: Secondary | ICD-10-CM | POA: Diagnosis not present

## 2017-07-14 DIAGNOSIS — E669 Obesity, unspecified: Secondary | ICD-10-CM

## 2017-07-14 NOTE — Progress Notes (Signed)
Office: [phone]  /  Fax: [phone]   HPI:   Chief Complaint: OBESITY Laura Perez is here to discuss her progress with her obesity treatment plan. She is on the  keep a food journal with 1200 calories and 90 grams of protein  and is following her eating plan approximately 100 % of the time. She states she is walking and stretching for 60 minutes 4 times per week. Laura Perez maintained her weight. She states she did well the first week but not as well the last two weeks. She states she has been on her menstrual cycle and has had an increase in cravings.  Her weight is 209 lb (94.8 kg) today and has maintained her weight since her last visit. She has lost 8 lbs since starting treatment with Laura Perez.   Vitamin D deficiency Laura Perez has a diagnosis of vitamin D deficiency. She is currently taking vit D and denies nausea, vomiting or muscle weakness.   Ref. Range 03/05/2017 10:55  Vitamin D, 25-Hydroxy Latest Ref Range: 30.0 - 100.0 ng/mL 30.3    ALLERGIES: No Known Allergies  MEDICATIONS: Current Outpatient Medications on File Prior to Visit  Medication Sig Dispense Refill  . beclomethasone (QVAR) 80 MCG/ACT inhaler Inhale 1 puff into the lungs as needed.    . clonazePAM (KLONOPIN) 0.5 MG tablet Take 0.5 mg by mouth 3 (three) times daily as needed for anxiety (Take one tablet at bedtime and one half tablet every morning).     Marland Kitchen. diclofenac (VOLTAREN) 75 MG EC tablet Take 1 tablet (75 mg total) by mouth 2 (two) times daily. 60 tablet 1  . lisdexamfetamine (VYVANSE) 50 MG capsule Take 50 mg by mouth daily.    . nortriptyline (PAMELOR) 10 MG capsule Take 10 mg by mouth at bedtime.    . ondansetron (ZOFRAN ODT) 4 MG disintegrating tablet Take one tab by mouth Q6hr prn nausea (Dissolve under tongue) 12 tablet 0  . Vitamin D, Ergocalciferol, (DRISDOL) 50000 units CAPS capsule Take 1 capsule (50,000 Units total) by mouth every 7 (seven) days. 4 capsule 0   No current facility-administered  medications on file prior to visit.     PAST MEDICAL HISTORY: Past Medical History:  Diagnosis Date  . ADHD   . Anxiety   . Asthma   . Depression   . GERD (gastroesophageal reflux disease)   . IBS (irritable bowel syndrome)   . PCOS (polycystic ovarian syndrome)     PAST SURGICAL HISTORY: No past surgical history on file.  SOCIAL HISTORY: Social History   Tobacco Use  . Smoking status: Never Smoker  . Smokeless tobacco: Never Used  Substance Use Topics  . Alcohol use: No    Alcohol/week: 0.0 oz  . Drug use: Not on file    FAMILY HISTORY: Family History  Adopted: Yes    ROS: Review of Systems  Constitutional: Negative for weight loss.  Gastrointestinal: Negative for nausea and vomiting.  Musculoskeletal:       Negative for muscle weakness    PHYSICAL EXAM: Blood pressure 100/67, pulse 90, temperature 98.1 F (36.7 C), temperature source Oral, height 5\' 6"  (1.676 m), weight 209 lb (94.8 kg), SpO2 98 %. Body mass index is 33.73 kg/m. Physical Exam  Constitutional: She is oriented to person, place, and time. She appears well-developed and well-nourished.  HENT:  Head: Normocephalic.  Eyes: EOM are normal.  Neck: Normal range of motion.  Cardiovascular: Normal rate.  Pulmonary/Chest: Effort normal.  Musculoskeletal: Normal range of motion.  Neurological:  She is alert and oriented to person, place, and time.  Skin: Skin is warm and dry.  Psychiatric: She has a normal mood and affect. Her behavior is normal.  Vitals reviewed.   RECENT LABS AND TESTS: BMET    Component Value Date/Time   NA 139 03/05/2017 1055   K 4.2 03/05/2017 1055   CL 103 03/05/2017 1055   CO2 22 03/05/2017 1055   GLUCOSE 90 03/05/2017 1055   BUN 14 03/05/2017 1055   CREATININE 0.78 03/05/2017 1055   CALCIUM 9.3 03/05/2017 1055   GFRNONAA 107 03/05/2017 1055   GFRAA 123 03/05/2017 1055   Lab Results  Component Value Date   HGBA1C 5.0 03/05/2017   HGBA1C 5.2 12/01/2016    HGBA1C 5.2 08/19/2016   HGBA1C 5.4 05/15/2016   Lab Results  Component Value Date   INSULIN 32.1 (H) 03/05/2017   INSULIN 24.6 12/01/2016   INSULIN 25.1 (H) 08/19/2016   INSULIN 30.2 (H) 05/15/2016   CBC    Component Value Date/Time   WBC 8.9 05/15/2016 1156   RBC 4.52 05/15/2016 1156   HGB 12.5 05/15/2016 1156   HCT 38.4 05/15/2016 1156   MCV 85 05/15/2016 1156   MCH 27.7 05/15/2016 1156   MCHC 32.6 05/15/2016 1156   RDW 14.1 05/15/2016 1156   LYMPHSABS 2.9 05/15/2016 1156   EOSABS 0.1 05/15/2016 1156   BASOSABS 0.1 05/15/2016 1156   Iron/TIBC/Ferritin/ %Sat No results found for: IRON, TIBC, FERRITIN, IRONPCTSAT Lipid Panel     Component Value Date/Time   CHOL 105 03/05/2017 1055   TRIG 277 (H) 03/05/2017 1055   HDL 25 (L) 03/05/2017 1055   LDLCALC 25 03/05/2017 1055   Hepatic Function Panel     Component Value Date/Time   PROT 6.9 03/05/2017 1055   ALBUMIN 4.2 03/05/2017 1055   AST 18 03/05/2017 1055   ALT 20 03/05/2017 1055   ALKPHOS 86 03/05/2017 1055   BILITOT 0.3 03/05/2017 1055      Component Value Date/Time   TSH 1.330 05/15/2016 1156      Ref. Range 03/05/2017 10:55  Vitamin D, 25-Hydroxy Latest Ref Range: 30.0 - 100.0 ng/mL 30.3    ASSESSMENT AND PLAN: Vitamin D deficiency  Class 1 obesity with serious comorbidity and body mass index (BMI) of 33.0 to 33.9 in adult, unspecified obesity type  PLAN: Vitamin D Deficiency Laura Perez was informed that low vitamin D levels contributes to fatigue and are associated with obesity, breast, and colon cancer. She agrees to continue to take prescription Vit D @50 ,000 IU every week and will follow up for routine testing of vitamin D, at least 2-3 times per year. She was informed of the risk of over-replacement of vitamin D and agrees to not increase her dose unless she discusses this with [place] first.  We spent > than 50% of the 15 minute visit on the counseling as documented in the note.  Obesity Laura Perez is  currently in the action stage of change. As such, her goal is to continue with weight loss efforts She has agreed to follow the Category 2 plan Laura Perez has been instructed to work up to a goal of 150 minutes of combined cardio and strengthening exercise per week for weight loss and overall health benefits. We discussed the following Behavioral Modification Strategies today: increasing lean protein intake and work on meal planning and easy cooking plans   Laura Perez has agreed to follow up with our clinic in 3 weeks. She was informed of the importance  of frequent follow up visits to maximize her success with intensive lifestyle modifications for her multiple health conditions.   Today's visit was # 22 out of 22.  Starting weight: 217 lb Starting date: 05/13/16 Today's weight : 209 lb Today's date: 07/14/2017 Total lbs lost to date: 8 (Patients must lose 7 lbs in the first 6 months to continue with counseling)   ASK: We discussed the diagnosis of obesity with Laura Perez today and Laura Perez agreed to give [place] permission to discuss obesity behavioral modification therapy today.  ASSESS: Laura Perez has the diagnosis of obesity and her BMI today is 33.73 Laura Perez is in the action stage of change   ADVISE: Laura Perez was educated on the multiple health risks of obesity as well as the benefit of weight loss to improve her health. She was advised of the need for long term treatment and the importance of lifestyle modifications.  AGREE: Multiple dietary modification options and treatment options were discussed and  Laura Perez agreed to the above obesity treatment plan.   Laura Perez, am acting as transcriptionist for [employer], PA-C I, Illa Level [facility], have reviewed this note and agree with its content.

## 2017-07-23 ENCOUNTER — Ambulatory Visit: Payer: No Typology Code available for payment source | Admitting: Clinical

## 2017-07-23 DIAGNOSIS — F411 Generalized anxiety disorder: Secondary | ICD-10-CM

## 2017-08-04 ENCOUNTER — Encounter (INDEPENDENT_AMBULATORY_CARE_PROVIDER_SITE_OTHER): Payer: Self-pay

## 2017-08-04 ENCOUNTER — Ambulatory Visit (INDEPENDENT_AMBULATORY_CARE_PROVIDER_SITE_OTHER): Payer: No Typology Code available for payment source | Admitting: Physician Assistant

## 2017-08-07 ENCOUNTER — Ambulatory Visit: Payer: No Typology Code available for payment source | Admitting: Clinical

## 2017-08-07 DIAGNOSIS — F411 Generalized anxiety disorder: Secondary | ICD-10-CM | POA: Diagnosis not present

## 2017-08-24 ENCOUNTER — Encounter: Payer: Self-pay | Admitting: Clinical

## 2017-08-24 ENCOUNTER — Ambulatory Visit: Payer: No Typology Code available for payment source | Admitting: Clinical

## 2017-08-24 DIAGNOSIS — F411 Generalized anxiety disorder: Secondary | ICD-10-CM

## 2017-09-01 ENCOUNTER — Ambulatory Visit: Payer: No Typology Code available for payment source | Admitting: Clinical

## 2017-09-01 DIAGNOSIS — F411 Generalized anxiety disorder: Secondary | ICD-10-CM

## 2017-09-08 ENCOUNTER — Ambulatory Visit: Payer: No Typology Code available for payment source | Admitting: Clinical

## 2017-09-10 ENCOUNTER — Ambulatory Visit: Payer: No Typology Code available for payment source | Admitting: Clinical

## 2017-09-10 DIAGNOSIS — F411 Generalized anxiety disorder: Secondary | ICD-10-CM | POA: Diagnosis not present

## 2017-09-22 ENCOUNTER — Ambulatory Visit: Payer: No Typology Code available for payment source | Admitting: Clinical

## 2017-09-22 DIAGNOSIS — F411 Generalized anxiety disorder: Secondary | ICD-10-CM

## 2017-09-29 ENCOUNTER — Ambulatory Visit: Payer: No Typology Code available for payment source | Admitting: Clinical

## 2017-10-12 ENCOUNTER — Ambulatory Visit
Admission: RE | Admit: 2017-10-12 | Discharge: 2017-10-12 | Disposition: A | Discharge status: Home / Self Care | Payer: No Typology Code available for payment source | Source: Ambulatory Visit | Attending: Family Medicine | Admitting: Family Medicine

## 2017-10-12 ENCOUNTER — Other Ambulatory Visit: Payer: Self-pay | Admitting: Family Medicine

## 2017-10-12 DIAGNOSIS — R079 Chest pain, unspecified: Secondary | ICD-10-CM

## 2017-10-13 ENCOUNTER — Ambulatory Visit: Payer: No Typology Code available for payment source | Admitting: Clinical

## 2017-10-13 DIAGNOSIS — F411 Generalized anxiety disorder: Secondary | ICD-10-CM

## 2017-10-27 ENCOUNTER — Ambulatory Visit: Payer: No Typology Code available for payment source | Admitting: Clinical

## 2017-10-27 DIAGNOSIS — F411 Generalized anxiety disorder: Secondary | ICD-10-CM

## 2017-11-10 ENCOUNTER — Ambulatory Visit: Payer: No Typology Code available for payment source | Admitting: Clinical

## 2017-11-17 ENCOUNTER — Ambulatory Visit: Payer: No Typology Code available for payment source | Admitting: Clinical

## 2017-12-01 ENCOUNTER — Ambulatory Visit: Payer: No Typology Code available for payment source | Admitting: Clinical

## 2017-12-01 DIAGNOSIS — F419 Anxiety disorder, unspecified: Secondary | ICD-10-CM

## 2017-12-15 ENCOUNTER — Ambulatory Visit: Payer: No Typology Code available for payment source | Admitting: Clinical

## 2017-12-15 DIAGNOSIS — F419 Anxiety disorder, unspecified: Secondary | ICD-10-CM | POA: Diagnosis not present

## 2017-12-29 ENCOUNTER — Ambulatory Visit: Payer: No Typology Code available for payment source | Admitting: Clinical

## 2017-12-29 DIAGNOSIS — F411 Generalized anxiety disorder: Secondary | ICD-10-CM

## 2018-01-12 ENCOUNTER — Ambulatory Visit: Payer: No Typology Code available for payment source | Admitting: Clinical

## 2018-01-12 DIAGNOSIS — F419 Anxiety disorder, unspecified: Secondary | ICD-10-CM | POA: Diagnosis not present

## 2018-02-09 ENCOUNTER — Ambulatory Visit: Payer: No Typology Code available for payment source | Admitting: Clinical

## 2018-02-09 DIAGNOSIS — F411 Generalized anxiety disorder: Secondary | ICD-10-CM | POA: Diagnosis not present

## 2018-02-12 ENCOUNTER — Ambulatory Visit (INDEPENDENT_AMBULATORY_CARE_PROVIDER_SITE_OTHER): Payer: No Typology Code available for payment source | Admitting: Clinical

## 2018-02-12 DIAGNOSIS — F419 Anxiety disorder, unspecified: Secondary | ICD-10-CM | POA: Diagnosis not present

## 2018-02-18 ENCOUNTER — Ambulatory Visit: Payer: No Typology Code available for payment source | Admitting: Clinical

## 2018-02-18 DIAGNOSIS — F411 Generalized anxiety disorder: Secondary | ICD-10-CM | POA: Diagnosis not present

## 2018-02-23 ENCOUNTER — Ambulatory Visit: Payer: No Typology Code available for payment source | Admitting: Clinical

## 2018-02-23 DIAGNOSIS — F411 Generalized anxiety disorder: Secondary | ICD-10-CM

## 2018-03-09 ENCOUNTER — Ambulatory Visit: Payer: No Typology Code available for payment source | Admitting: Clinical

## 2018-03-09 DIAGNOSIS — F411 Generalized anxiety disorder: Secondary | ICD-10-CM | POA: Diagnosis not present

## 2018-03-11 ENCOUNTER — Encounter: Payer: Self-pay | Admitting: Psychiatry

## 2018-03-11 ENCOUNTER — Ambulatory Visit: Payer: No Typology Code available for payment source | Admitting: Psychiatry

## 2018-03-11 VITALS — BP 114/84 | HR 76 | Ht 67.5 in | Wt 229.0 lb

## 2018-03-11 DIAGNOSIS — F411 Generalized anxiety disorder: Secondary | ICD-10-CM | POA: Diagnosis not present

## 2018-03-11 DIAGNOSIS — F410 Panic disorder [episodic paroxysmal anxiety] without agoraphobia: Secondary | ICD-10-CM | POA: Diagnosis not present

## 2018-03-11 DIAGNOSIS — F5081 Binge eating disorder: Secondary | ICD-10-CM | POA: Diagnosis not present

## 2018-03-11 DIAGNOSIS — F430 Acute stress reaction: Secondary | ICD-10-CM | POA: Diagnosis not present

## 2018-03-11 DIAGNOSIS — F902 Attention-deficit hyperactivity disorder, combined type: Secondary | ICD-10-CM

## 2018-03-11 MED ORDER — LISDEXAMFETAMINE DIMESYLATE 60 MG PO CAPS: 60.0000 mg | ORAL_CAPSULE | ORAL | 0 refills | Status: DC

## 2018-03-11 MED ORDER — NORTRIPTYLINE HCL 25 MG PO CAPS: 25.0000 mg | ORAL_CAPSULE | Freq: Every day | ORAL | 2 refills | Status: DC

## 2018-03-11 MED ORDER — LAMOTRIGINE 100 MG PO TABS: 100.0000 mg | ORAL_TABLET | Freq: Every evening | ORAL | 2 refills | Status: DC

## 2018-03-11 MED ORDER — CLONAZEPAM 0.5 MG PO TABS: 0.5000 mg | ORAL_TABLET | Freq: Two times a day (BID) | ORAL | 2 refills | Status: DC | PRN

## 2018-03-11 MED ORDER — NORTRIPTYLINE HCL 10 MG PO CAPS: 10.0000 mg | ORAL_CAPSULE | Freq: Every day | ORAL | 2 refills | Status: DC
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
## 2018-03-23 ENCOUNTER — Ambulatory Visit: Payer: No Typology Code available for payment source | Admitting: Clinical

## 2018-03-23 DIAGNOSIS — F411 Generalized anxiety disorder: Secondary | ICD-10-CM | POA: Diagnosis not present

## 2018-03-25 ENCOUNTER — Ambulatory Visit: Payer: Self-pay | Admitting: Clinical

## 2018-04-06 ENCOUNTER — Ambulatory Visit: Payer: No Typology Code available for payment source | Admitting: Clinical

## 2018-04-06 DIAGNOSIS — F411 Generalized anxiety disorder: Secondary | ICD-10-CM

## 2018-04-20 ENCOUNTER — Ambulatory Visit: Payer: No Typology Code available for payment source | Admitting: Clinical

## 2018-04-20 DIAGNOSIS — F411 Generalized anxiety disorder: Secondary | ICD-10-CM

## 2018-05-04 ENCOUNTER — Ambulatory Visit: Payer: No Typology Code available for payment source | Admitting: Clinical

## 2018-05-11 ENCOUNTER — Ambulatory Visit: Payer: No Typology Code available for payment source | Admitting: Psychiatry

## 2018-05-11 ENCOUNTER — Encounter: Payer: Self-pay | Admitting: Psychiatry

## 2018-05-11 VITALS — BP 124/84 | HR 72 | Ht 67.0 in | Wt 227.0 lb

## 2018-05-11 DIAGNOSIS — F410 Panic disorder [episodic paroxysmal anxiety] without agoraphobia: Secondary | ICD-10-CM | POA: Diagnosis not present

## 2018-05-11 DIAGNOSIS — F5081 Binge eating disorder: Secondary | ICD-10-CM

## 2018-05-11 DIAGNOSIS — F411 Generalized anxiety disorder: Secondary | ICD-10-CM | POA: Diagnosis not present

## 2018-05-11 DIAGNOSIS — F902 Attention-deficit hyperactivity disorder, combined type: Secondary | ICD-10-CM | POA: Diagnosis not present

## 2018-05-11 MED ORDER — DEXTROAMPHETAMINE SULFATE 10 MG PO TABS: 10.0000 mg | ORAL_TABLET | Freq: Two times a day (BID) | ORAL | 0 refills | Status: DC

## 2018-05-11 MED ORDER — CLONAZEPAM 0.5 MG PO TABS: 0.5000 mg | ORAL_TABLET | Freq: Three times a day (TID) | ORAL | 0 refills | Status: DC

## 2018-05-11 NOTE — Progress Notes (Signed)
[facility]  Patient ID: Laura Perez,  MRN: [id number]  PCP: Laurann Montana, MD  Date of Evaluation: 05/11/2018 Time spent:20 minutes  Chief Complaint:  Chief Complaint    ADHD; Anxiety; Eating Disorder      HISTORY/CURRENT STATUS: Laura Perez is seen conjointly with mother face-to-face with consent not collateral for psychiatric interview and exam in 59-month evaluation and management of GAD and panic, ADHD, binge eating, and resolving acute stress disorder.  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  Also she notes that hours are reduced at the pharmacy as they have hired too many employees expecting seasonal attrition and now have only so many hours for each.  She wants to quit work but mother is not allowing such and she knows it is not in her best interest, but still her boyfriend Laura Perez has lost his job and then entered the hospital with his diabetes.  Sister and mother are doing well except mother has some hypertension now and apparent weight gain.  Yasheka sleeps at home to avoid responsibility.  She does not sleep well at night as she is taking her Vyvanse and working shorter hours and then the Vyvanse last into the hours of sleep.  She finds the Klonopin is not frequent enough to keep control of her anxiety.  She doubts she needs the Lamictal but acknowledges that it time she is ready to explode.  Has no diarrhea or nominal pain on her Viberzi.  Anxiety  Presents for follow-up visit. Symptoms include compulsions, decreased concentration, depressed mood, excessive worry, insomnia, muscle tension, nervous/anxious behavior and panic. Patient reports no suicidal  ideas. Symptoms occur most days. The most recent episode lasted 2 hours. The severity of symptoms is causing significant distress and interfering with daily activities. The patient sleeps 4 hours per night. The quality of sleep is fair. Nighttime awakenings: occasional.   Compliance with medications is 51-75%. Side effects of treatment include GI discomfort.    Individual Medical History/ Review of Systems: Changes? :Yes Family and peer stressors undermine support for her overcoming work changes about which she is now manifesting great protest.  Allergies: Patient has no known allergies.  Current Medications:  Current Outpatient Medications:  .  beclomethasone (QVAR) 80 MCG/ACT inhaler, Inhale 1 puff into the lungs as needed., Disp: , Rfl:  .  clonazePAM (KLONOPIN) 0.5 MG tablet, Take 1 tablet (0.5 mg total) by mouth 3 (three) times daily., Disp: 90 tablet, Rfl: 0 .  dextroamphetamine (DEXTROSTAT) 10 MG tablet, Take 1 tablet (10 mg total) by mouth 2 (two) times daily., Disp: 60 tablet, Rfl: 0 .  diclofenac (VOLTAREN) 75 MG EC tablet, Take 1 tablet (75 mg total) by mouth 2 (two) times daily., Disp: 60 tablet, Rfl: 1 .  lamoTRIgine (LAMICTAL) 100 MG tablet, Take 1 tablet (100 mg total) by mouth Nightly., Disp: 30 tablet, Rfl: 2 .  lisdexamfetamine (VYVANSE) 60 MG capsule, Take 1 capsule (60 mg total) by mouth every morning., Disp: 30 capsule, Rfl: 0 .  lisdexamfetamine (VYVANSE) 60 MG capsule, Take 1 capsule (60 mg total) by mouth every morning., Disp: 30 capsule, Rfl: 0 .  nortriptyline (PAMELOR) 10 MG capsule, Take 1  capsule (10 mg total) by mouth at bedtime., Disp: 30 capsule, Rfl: 2 .  nortriptyline (PAMELOR) 25 MG capsule, Take 1 capsule (25 mg total) by mouth at bedtime., Disp: 30 capsule, Rfl: 2 .  ondansetron (ZOFRAN ODT) 4 MG disintegrating tablet, Take one tab by mouth Q6hr prn nausea (Dissolve under tongue), Disp: 12 tablet, Rfl: 0 .  Vitamin D, Ergocalciferol, (DRISDOL) 50000 units  CAPS capsule, Take 1 capsule (50,000 Units total) by mouth every 7 (seven) days., Disp: 4 capsule, Rfl: 0 Medication Side Effects: none  Family Medical/ Social History: Changes? Yes patient denies substance use but is doubtful about her medications when she needs consistency and efficacy  MENTAL HEALTH EXAM: Muscle strength 5/5, postural reflexes 0/0 and AIMS equals 0. Blood pressure 124/84, pulse 72, height 5\' 7"  (1.702 m), weight 227 lb (103 kg).Body mass index is 35.55 kg/m.  General Appearance: Casual, Fairly Groomed and Guarded , obese  Eye Contact:  Good  Speech:  Clear and Coherent and Talkative  Volume:  Increased  Mood:  Angry, Anxious, Dysphoric, Hopeless and Worthless  Affect:  Non-Congruent, Depressed, Labile, Tearful and Anxious  Thought Process:  Disorganized and Goal Directed  Orientation:  Full (Time, Place, and Person)  Thought Content: Obsessions and Rumination   Suicidal Thoughts:  No  Homicidal Thoughts:  No  Memory:  Immediate;   Fair Remote;   Fair  Judgement:  Impaired  Insight:  Lacking  Psychomotor Activity:  Increased  Concentration:  Concentration: Fair and Attention Span: Fair  Recall:  [employer]  Fund of Knowledge: Fair  Language: Fair  Assets:  Leisure Time Resilience Talents/Skills  ADL's:  Intact  Cognition: WNL  Prognosis:  Fair    DIAGNOSES:    ICD-10-CM   1. Generalized anxiety disorder F41.1 clonazePAM (KLONOPIN) 0.5 MG tablet  2. Panic disorder F41.0 clonazePAM (KLONOPIN) 0.5 MG tablet  3. Attention deficit hyperactivity disorder (ADHD), combined type, moderate F90.2 dextroamphetamine (DEXTROSTAT) 10 MG tablet  4. Binge eating disorder F50.81 clonazePAM (KLONOPIN) 0.5 MG tablet    dextroamphetamine (DEXTROSTAT) 10 MG tablet    Receiving Psychotherapy: Yes Charlyne MomJenna Mendelson, PhD   RECOMMENDATIONS: We increase the Klonopin 0.5 mg to 1 3 times daily #90 with 1 refill prescribed.  Her Vyvanse is changed to Dextrostat 10 mg IR as 1 before work  and may take an extra 1 before the end of the shift if needed so the prescription is for twice daily #60 each for December and January for ADHD and binge eating disorder with Klonopin for panic and GAD.  She continues Lamictal 100 mg nightly for the resolving acute stress and persisting panic.  She takes nortriptyline 25+10 mg nightly for her panic, GAD, and ADHD.  All [facility] [address] returns in 4 weeks.   Chauncey MannGlenn E Kasi Lasky, MD

## 2018-06-01 ENCOUNTER — Ambulatory Visit: Payer: No Typology Code available for payment source | Admitting: Clinical

## 2018-06-01 DIAGNOSIS — F411 Generalized anxiety disorder: Secondary | ICD-10-CM

## 2018-06-08 ENCOUNTER — Ambulatory Visit: Payer: No Typology Code available for payment source | Admitting: Psychiatry

## 2018-06-08 ENCOUNTER — Encounter: Payer: Self-pay | Admitting: Psychiatry

## 2018-06-08 VITALS — BP 118/82 | HR 68 | Ht 67.0 in | Wt 224.0 lb

## 2018-06-08 DIAGNOSIS — F410 Panic disorder [episodic paroxysmal anxiety] without agoraphobia: Secondary | ICD-10-CM | POA: Diagnosis not present

## 2018-06-08 DIAGNOSIS — F411 Generalized anxiety disorder: Secondary | ICD-10-CM | POA: Diagnosis not present

## 2018-06-08 DIAGNOSIS — F902 Attention-deficit hyperactivity disorder, combined type: Secondary | ICD-10-CM | POA: Diagnosis not present

## 2018-06-08 DIAGNOSIS — F5081 Binge eating disorder: Secondary | ICD-10-CM | POA: Diagnosis not present

## 2018-06-08 DIAGNOSIS — F430 Acute stress reaction: Secondary | ICD-10-CM

## 2018-06-08 DIAGNOSIS — M722 Plantar fascial fibromatosis: Secondary | ICD-10-CM

## 2018-06-08 MED ORDER — NORTRIPTYLINE HCL 10 MG PO CAPS: 10.0000 mg | ORAL_CAPSULE | Freq: Every day | ORAL | 2 refills | Status: DC

## 2018-06-08 MED ORDER — LISDEXAMFETAMINE DIMESYLATE 60 MG PO CAPS: 60.0000 mg | ORAL_CAPSULE | Freq: Every day | ORAL | 0 refills | Status: DC

## 2018-06-08 MED ORDER — NORTRIPTYLINE HCL 25 MG PO CAPS: 25.0000 mg | ORAL_CAPSULE | Freq: Every day | ORAL | 2 refills | Status: DC

## 2018-06-08 MED ORDER — CLONAZEPAM 0.5 MG PO TABS: 0.5000 mg | ORAL_TABLET | Freq: Three times a day (TID) | ORAL | 2 refills | Status: DC | PRN

## 2018-06-08 NOTE — Progress Notes (Signed)
[facility]  Patient ID: Laura Perez,  MRN: [id number]  PCP: Laurann MontanaWhite, Cynthia, MD  Date of Evaluation: 06/08/2018 Time spent:20 minutes  Chief Complaint:  Chief Complaint    ADHD; Anxiety; Depression; Eating Disorder      HISTORY/CURRENT STATUS: Laura Perez is seen conjointly with adoptive mother face-to-face with consent not collateral for psychiatric interview and exam in ADHD, generalized and panic anxiety, and binge eating disorder.  Conflict over hours of employment changing from mornings to afternoons last appointment has been worked through herself though without specific acknowledgment by family or self.  Dextrostat 10 mg IR up to twice daily added to her 60 mg of Vyvanse has been helpful.  Klonopin as needed is dosed before work and evening before trying to sleep.  Acute Stress disorder for father's heart attack resolved such that the patient just stopped her Lamictal.  Previous fianc has become employed as an adaptive step in their life.  However mother has been diagnosed with advancing stages of her renal disease now stage IV.  Anxiety  Presents for follow-up visit. Symptoms include decreased concentration, excessive worry, insomnia, muscle tension, nausea, nervous/anxious behavior, obsessions and panic. Patient reports no compulsions, confusion, depressed mood, dizziness or suicidal ideas. Symptoms occur most days. The severity of symptoms is causing significant distress and interfering with daily activities. The quality of sleep is fair. Nighttime awakenings: occasional.   Compliance with medications is 76-100%. Side effects of treatment include GI discomfort.    Individual Medical History/ Review of Systems: Changes? :No   Allergies: Patient has no known allergies.  Current Medications:  Current Outpatient Medications:  .  beclomethasone (QVAR) 80 MCG/ACT inhaler, Inhale 1 puff into the lungs as needed., Disp: , Rfl:  .  clonazePAM (KLONOPIN) 0.5 MG tablet,  Take 1 tablet (0.5 mg total) by mouth 3 (three) times daily., Disp: 90 tablet, Rfl: 0 .  dextroamphetamine (DEXTROSTAT) 10 MG tablet, Take 1 tablet (10 mg total) by mouth 2 (two) times daily., Disp: 60 tablet, Rfl: 0 .  diclofenac (VOLTAREN) 75 MG EC tablet, Take 1 tablet (75 mg total) by mouth 2 (two) times daily., Disp: 60 tablet, Rfl: 1 .  lisdexamfetamine (VYVANSE) 60 MG capsule, Take 1 capsule (60 mg total) by mouth every morning., Disp: 30 capsule, Rfl: 0 .  lisdexamfetamine (VYVANSE) 60 MG capsule, Take 1 capsule (60 mg total) by mouth every morning., Disp: 30 capsule, Rfl: 0 .  nortriptyline (PAMELOR) 10 MG capsule, Take 1 capsule (10 mg total) by mouth at bedtime., Disp: 30 capsule, Rfl: 2 .  nortriptyline (PAMELOR) 25 MG capsule, Take 1 capsule (25 mg total) by mouth at bedtime., Disp: 30 capsule, Rfl: 2 .  ondansetron (ZOFRAN ODT) 4 MG disintegrating tablet, Take one tab by mouth Q6hr prn nausea (Dissolve under tongue), Disp: 12 tablet, Rfl: 0 .  Vitamin D, Ergocalciferol, (DRISDOL) 50000 units CAPS capsule, Take 1 capsule (50,000 Units total) by mouth every 7 (seven) days., Disp: 4 capsule, Rfl: 0 Medication Side Effects: none  Family Medical/ Social History: Changes? No  MENTAL HEALTH EXAM: Muscle strength 5/5, postural reflexes 0/0, and AIMS equals 0. Blood pressure 118/82, pulse 68, height 5\' 7"  (1.702 m), weight 224 lb (101.6 kg).Body mass index is 35.08 kg/m.  General Appearance: Casual, Disheveled, Guarded and Obese  Eye Contact:  Fair  Speech:  Clear and Coherent and Talkative  Volume:  Normal  Mood:  Anxious, Euthymic, Irritable and Worthless  Affect:  Full Range and Anxious  Thought Process:  Disorganized,  Goal Directed and Linear  Orientation:  Full (Time, Place, and Person)  Thought Content: Obsessions and Rumination   Suicidal Thoughts:  No  Homicidal Thoughts:  No  Memory:  Immediate;   Fair Remote;   Fair  Judgement:  Fair  Insight:  Fair and Lacking   Psychomotor Activity:  Increased  Concentration:  Concentration: Fair and Attention Span: Poor  Recall:  [employer]Fair  Fund of Knowledge: Fair  Language: Fair  Assets:  Resilience Social Support Talents/Skills  ADL's:  Intact  Cognition: WNL  Prognosis:  Fair    DIAGNOSES:    ICD-10-CM   1. Generalized anxiety disorder F41.1   2. Attention deficit hyperactivity disorder (ADHD), combined type, moderate F90.2   3. Panic disorder F41.0   4. Binge eating disorder F50.81   5. Bilateral plantar fasciitis M72.2     Receiving Psychotherapy: Yes Charlyne MomJenna Mendelson, PhD   RECOMMENDATIONS: Consistent interventions in this office and in therapy provide some resource and direction for adaptive coping which seems relatively insufficient still for the stresses in her daily life.  Medications have therefore played ever a greater role in her month-to-month employment, family responsibilities, and relationship life outside the family.  She is escribed Vyvanse 60 mg every morning for ADHD and binge eating #60 each for January,February, and March [facility] [address].  She has adequate supply of Dextrostat 10 mg IR twice daily as needed for workloads or other responsibilities beyond her adaptive ADHD limits.  Klonopin 0.5 mg 3 times daily as needed for anxiety is sent with [facility]  Her nortriptyline is sent as 10 mg and 25 mg for total of 35 mg nightly #30 each with 2 ref[facility] for panic and generalized anxiety.  She plans return in 3 months attempting self-help and life support and integration.   Chauncey MannGlenn E Linna Thebeau, MD

## 2018-06-15 ENCOUNTER — Ambulatory Visit: Payer: No Typology Code available for payment source | Admitting: Clinical

## 2018-06-15 DIAGNOSIS — F411 Generalized anxiety disorder: Secondary | ICD-10-CM | POA: Diagnosis not present

## 2018-06-29 ENCOUNTER — Ambulatory Visit: Payer: No Typology Code available for payment source | Admitting: Clinical

## 2018-06-29 DIAGNOSIS — F411 Generalized anxiety disorder: Secondary | ICD-10-CM

## 2018-07-13 ENCOUNTER — Ambulatory Visit (INDEPENDENT_AMBULATORY_CARE_PROVIDER_SITE_OTHER): Payer: No Typology Code available for payment source | Admitting: Clinical

## 2018-07-13 DIAGNOSIS — F411 Generalized anxiety disorder: Secondary | ICD-10-CM

## 2018-07-27 ENCOUNTER — Ambulatory Visit (INDEPENDENT_AMBULATORY_CARE_PROVIDER_SITE_OTHER): Payer: No Typology Code available for payment source | Admitting: Clinical

## 2018-07-27 ENCOUNTER — Ambulatory Visit: Payer: No Typology Code available for payment source | Admitting: Psychiatry

## 2018-07-27 ENCOUNTER — Encounter: Payer: Self-pay | Admitting: Psychiatry

## 2018-07-27 VITALS — BP 128/86 | HR 76 | Ht 68.0 in | Wt 192.0 lb

## 2018-07-27 DIAGNOSIS — F902 Attention-deficit hyperactivity disorder, combined type: Secondary | ICD-10-CM | POA: Diagnosis not present

## 2018-07-27 DIAGNOSIS — F411 Generalized anxiety disorder: Secondary | ICD-10-CM | POA: Diagnosis not present

## 2018-07-27 DIAGNOSIS — F410 Panic disorder [episodic paroxysmal anxiety] without agoraphobia: Secondary | ICD-10-CM

## 2018-07-27 DIAGNOSIS — F5081 Binge eating disorder: Secondary | ICD-10-CM

## 2018-07-27 DIAGNOSIS — F321 Major depressive disorder, single episode, moderate: Secondary | ICD-10-CM

## 2018-07-27 DIAGNOSIS — F322 Major depressive disorder, single episode, severe without psychotic features: Secondary | ICD-10-CM | POA: Insufficient documentation

## 2018-07-27 MED ORDER — BREXPIPRAZOLE 0.5 MG PO TABS: 0.5000 mg | ORAL_TABLET | Freq: Every day | ORAL | 0 refills | Status: DC

## 2018-07-27 MED ORDER — BREXPIPRAZOLE 1 MG PO TABS: 0.5000 mg | ORAL_TABLET | Freq: Every day | ORAL | 0 refills | Status: DC

## 2018-07-27 NOTE — Progress Notes (Signed)
[facility]  Patient ID: Laura Perez,  MRN: [id number]  PCP: Laura Montana, MD  Date of Evaluation: 07/27/2018 Time spent:20 minutes  Chief Complaint:  Chief Complaint    Anxiety; Depression; Eating Disorder      HISTORY/CURRENT STATUS: Laura Perez is seen conjointly with adoptive mother face-to-face with consent without collateral for psychiatric interview and exam in 7-week evaluation and management of newly diagnosed major depression, longstanding generalized and panic anxiety and ADHD, and binge overeating now up another 3 pounds.  Despite some hope with the addition of Dextrostat to Vyvanse for work and the [employer] for panic and generalized anxiety at last appointment, the patient has regressed doubting that nortriptyline helps even with augmentation by Lamictal.  We address today the consequences of her depressive deterioration of general health as anxiety persists obviously worse with family health problems though patient does not acknowledge such nor does adoptive mother.  Fixations in focus include the evolving melancholic depressive symptoms of anhedonia, guilty ruminations, hopelessness for life, and expectations for death.  In her therapy session today at [facility], they expect medications to resolve the steadily increasing complexity and multiplicity of pathology.  We look back today to mother's prediction that the patient would never be self-sufficient or independent in life.  Patient is the only household member who currently works may have to relinquish that with depression if not relieved.  As a child and adolescent psychiatrist, I have treated the patient since 04/24/2005 and she may at this point prefer and need an adult psychiatrist or a specialist in treatment resistant depression all expect to still get worse.  She repeatedly refers to "court" coming up soon likely in the judge's chambers apparently contesting the withholding of her SSI. Depression       The  patient presents with depression.  This is a new problem.  The current episode started more than 1 month ago.   The onset quality is sudden.   The problem occurs daily.  The problem has been gradually worsening since onset.  Associated symptoms include decreased concentration, fatigue, helplessness, hopelessness, insomnia, irritable, restlessness, decreased interest, appetite change, sad and suicidal ideas.  Associated symptoms include no body aches, no myalgias, no headaches and no indigestion.     The symptoms are aggravated by work stress, medication, social issues and family issues.  Past treatments include SSRIs - Selective serotonin reuptake inhibitors, other medications and psychotherapy.  Compliance with treatment is variable and good.  Past compliance problems include medication issues, difficulty with treatment plan and medical issues.  Previous treatment provided moderate relief.  Risk factors include a change in medications, history of mental illness, family history, family violence, major life event, prior traumatic experience and stress.   Past medical history includes anxiety, depression, mental health disorder and suicide attempts.     Pertinent negatives include no chronic illness, no recent illness, no life-threatening condition, no physical disability, no recent psychiatric admission, no bipolar disorder, no obsessive-compulsive disorder, no post-traumatic stress disorder, no schizophrenia and no head trauma.   Individual Medical History/ Review of Systems: Changes? :Yes Patient saw Laura Mom, PhD for therapy today stating that the session helped her cope with all the changes, but mother states the patient's anxiety makes her depressed now as anxiety never has gone away since early childhood per family, and family now perceives her irreversibly depressed when from psychiatric perspective this is her first major depressive episode.  Patient finds herself isolating to home and staying in  room in bed without  explanation except she is apprehensive about court hearing soon to hear arguments about granting her disability.  Her anxiety contributes to more depression which contributes to overeating with crying, diminished sleep, diminished interest, and a sense of running herself ragged when she does work so that she often just stays home depressed.  Allergies: Patient has no known allergies.  Current Medications:  Current Outpatient Medications:  .  beclomethasone (QVAR) 80 MCG/ACT inhaler, Inhale 1 puff into the lungs as needed., Disp: , Rfl:  .  clonazePAM (KLONOPIN) 0.5 MG tablet, Take 1 tablet (0.5 mg total) by mouth 3 (three) times daily as needed for anxiety., Disp: 90 tablet, Rfl: 2 .  dextroamphetamine (DEXTROSTAT) 10 MG tablet, Take 1 tablet (10 mg total) by mouth 2 (two) times daily., Disp: 60 tablet, Rfl: 0 .  diclofenac (VOLTAREN) 75 MG EC tablet, Take 1 tablet (75 mg total) by mouth 2 (two) times daily., Disp: 60 tablet, Rfl: 1 .  lisdexamfetamine (VYVANSE) 60 MG capsule, Take 1 capsule (60 mg total) by mouth daily after breakfast for 30 days., Disp: 30 capsule, Rfl: 0 .  lisdexamfetamine (VYVANSE) 60 MG capsule, Take 1 capsule (60 mg total) by mouth daily after breakfast for 30 days., Disp: 30 capsule, Rfl: 0 .  [START ON 08/07/2018] lisdexamfetamine (VYVANSE) 60 MG capsule, Take 1 capsule (60 mg total) by mouth daily after breakfast for 30 days., Disp: 30 capsule, Rfl: 0 .  nortriptyline (PAMELOR) 10 MG capsule, Take 1 capsule (10 mg total) by mouth at bedtime., Disp: 30 capsule, Rfl: 2 .  nortriptyline (PAMELOR) 25 MG capsule, Take 1 capsule (25 mg total) by mouth at bedtime., Disp: 30 capsule, Rfl: 2 .  ondansetron (ZOFRAN ODT) 4 MG disintegrating tablet, Take one tab by mouth Q6hr prn nausea (Dissolve under tongue), Disp: 12 tablet, Rfl: 0 .  Vitamin D, Ergocalciferol, (DRISDOL) 50000 units CAPS capsule, Take 1 capsule (50,000 Units total) by mouth every 7 (seven) days.,  Disp: 4 capsule, Rfl: 0   Medication Side Effects: anxiety, fatigue/weakness and weight gain  Family Medical/ Social History: Changes? Yes adoptive mother states she will be on dialysis soon for her chronic renal disease.  Adoptive father has recently had coronary stenting and has very expensive vascular medications following his heart attack.  Adoptive sister has offered to include patient and social life patient is staying home declining most activities out with boyfriend Susy Frizzle and mostly planning upcoming court hearing for SSI with her doubting she will receive benefits.  Adoptive mother thinks patient should continue working as she ends meaning and relations fulfilling in the job but is that patient is overwhelmed with more than 20 hours weeklyand may have to reduce that.  MENTAL HEALTH EXAM: Muscle strengths and tone 5/5, postural reflexes and gait 0/0, and AIMS = 0. Blood pressure 128/86, pulse 76, height 5\' 8"  (1.727 m), weight 192 lb (87.1 kg).Body mass index is 29.19 kg/m.  General Appearance: Casual, Guarded, Well Groomed and Obese  Eye Contact:  Fair  Speech:  Clear and Coherent, Normal Rate and Talkative  Volume:  Normal  Mood:  Anxious, Dysphoric, Hopeless, Irritable and Worthless  Affect:  Constricted, Inappropriate, Labile, Restricted and Anxious  Thought Process:  Goal Directed, Irrelevant and Linear  Orientation:  Full (Time, Place, and Person)  Thought Content: Illogical, Ilusions, Obsessions, Paranoid Ideation and Rumination   Suicidal Thoughts:  No  Homicidal Thoughts:  No  Memory:  Immediate;   Fair Remote;   Fair  Judgement:  Fair  Insight:  Fair and Lacking  Psychomotor Activity: Negative, normal, increased, and mannerisms with no restlessness (akathisia) or shuffling gait (EPS)  Concentration:  Concentration: Fair and Attention Span: Fair  Recall:  [employer]Fair  Fund of Knowledge: Fair  Language: Good  Assets:  Leisure Time Resilience Social Support Talents/Skills   ADL's:  Impaired  Cognition: WNL  Prognosis:  Poor    DIAGNOSES:    ICD-10-CM   1. Moderate major depression, single episode (HCC) F32.1   2. Attention deficit hyperactivity disorder (ADHD), combined type, moderate F90.2   3. Generalized anxiety disorder F41.1   4. Panic disorder F41.0   5. Binge eating disorder F50.81     Receiving Psychotherapy: Yes Laura MomJenna Mendelson, PhD   RECOMMENDATIONS: I process options such as Dr. Oralia RudAlexander [place] specialist in treatment resistant depression for [place] in addition to other adult psychiatry available in the area.  They interpret I must be retiring to bring up this option that I explained again the ambivalence of mounting pathology not responding to intensive therapeutics.  With the major depression now, Rexulti samples are given to take 0.5 g every morning for 8 weeks until already established return appointment here 4 to 8 week from last session.  She has current supply to continue Klonopin 0.5 mg 3 times daily GAD and panic, Vyvanse 60 mg every morning and Dextrostat 10 mg IR twice daily for ADHD and binge eating disorder, and nortriptyline 25 last 10 equals 35 mg nightly for depression and anxiety.  She has interim court appearance relative to her disability determination services, and all options for coping, involution, regression and progression are processed for patient and family to conclude the initial expectation of adoption that the patient would never establish self-sufficiency or otherwise the patient will with adoptive family's help establish the work and relationships she has developed as an important start of an adult future if possible for mental illness.   Chauncey MannGlenn E Jennings, MD

## 2018-08-09 ENCOUNTER — Telehealth: Payer: Self-pay | Admitting: Psychiatry

## 2018-08-09 DIAGNOSIS — Z0289 Encounter for other administrative examinations: Secondary | ICD-10-CM

## 2018-08-09 NOTE — Telephone Encounter (Signed)
Mother Pam stated patient needs letter to be out of work for 3 weeks due to medical condition mom stated you've written this letter before

## 2018-08-09 NOTE — Telephone Encounter (Signed)
Following last appointment 07/27/2018 addition of Rexulti 0.5 mg every morning, mother indicates patient has not improved sufficiently such that 3 weeks medically excused from work is added to her Rexuti, [employer], Klonopin, and Dextrostat/Vyvanse.  They plan to pick up this work eXcuse.

## 2018-08-10 ENCOUNTER — Ambulatory Visit (INDEPENDENT_AMBULATORY_CARE_PROVIDER_SITE_OTHER): Payer: No Typology Code available for payment source | Admitting: Clinical

## 2018-08-10 DIAGNOSIS — F411 Generalized anxiety disorder: Secondary | ICD-10-CM | POA: Diagnosis not present

## 2018-08-24 ENCOUNTER — Ambulatory Visit (INDEPENDENT_AMBULATORY_CARE_PROVIDER_SITE_OTHER): Payer: No Typology Code available for payment source | Admitting: Clinical

## 2018-08-24 DIAGNOSIS — F411 Generalized anxiety disorder: Secondary | ICD-10-CM | POA: Diagnosis not present

## 2018-09-07 ENCOUNTER — Other Ambulatory Visit: Payer: Self-pay

## 2018-09-07 ENCOUNTER — Ambulatory Visit: Payer: No Typology Code available for payment source | Admitting: Clinical

## 2018-09-07 ENCOUNTER — Encounter: Payer: Self-pay | Admitting: Psychiatry

## 2018-09-07 ENCOUNTER — Ambulatory Visit (INDEPENDENT_AMBULATORY_CARE_PROVIDER_SITE_OTHER): Payer: No Typology Code available for payment source | Admitting: Psychiatry

## 2018-09-07 DIAGNOSIS — F322 Major depressive disorder, single episode, severe without psychotic features: Secondary | ICD-10-CM

## 2018-09-07 DIAGNOSIS — F411 Generalized anxiety disorder: Secondary | ICD-10-CM

## 2018-09-07 DIAGNOSIS — F410 Panic disorder [episodic paroxysmal anxiety] without agoraphobia: Secondary | ICD-10-CM | POA: Diagnosis not present

## 2018-09-07 DIAGNOSIS — F5081 Binge eating disorder: Secondary | ICD-10-CM

## 2018-09-07 DIAGNOSIS — F902 Attention-deficit hyperactivity disorder, combined type: Secondary | ICD-10-CM

## 2018-09-07 MED ORDER — LITHIUM CARBONATE ER 300 MG PO TBCR: 600.0000 mg | EXTENDED_RELEASE_TABLET | Freq: Every day | ORAL | 1 refills | Status: DC

## 2018-09-07 NOTE — Progress Notes (Signed)
[facility]  Patient ID: Laura Perez,  MRN: [id number]  PCP: Laurann Montana, MD  Date of Evaluation: 09/07/2018 Time spent:20 minutes from 1600 to 1620  I connected with patient by a video enabled telemedicine application or telephone, with their informed consent, and verified patient privacy and that I am speaking with the correct person using two identifiers.  I was located at [place] office and patient at home of adoptive mother.  Chief Complaint:   Depression     Anxiety    ADHD    Stress      HISTORY/CURRENT STATUS: Laura Perez is provided telemedicine audio appointment session for which I reminded her 20 minutes ahead of time so she had her medication bottles collected in the interim, declining video portion for panic and generalized anxiety, with consent not collateral except conjointly with adoptive mother for psychiatric interview and exam in 6-week evaluation and management of new onset depression, panic and generalized anxiety, binge eating, and ADHD.  Adoptive mother again emphasizes how impaired in function and miserable in emotion the patient is now. The patient has difficulty even in person being consistent in her self reports, though the audio intervention today attempts to verify for she and family and clarify for treatment matching complaints of depression and anxiety getting worse despite continued treatment.  Mother and patient are surprised today that I am a child and adolescent psychiatrist despite having treated the patient since childhood on referral from psychologist Ollen Gross 04/24/2005.  Update sister is having to find a new primary care provider as she is out grown her pediatrician, and they may want the same for the patient to have an adult psychiatrist without the projections from patient's childhood by adoptive family and patient.  Adoptive mother always feared the patient would not be able to competently manage adult affairs.  The patient is in  the paradoxical position of being the only 1 in the family to be working other than adoptive father when she has court proceedings relative to her stability determination for benefits.  Since last appointment, mother required in the interim a letter by which the patient could have 3 weeks off work to [employer].  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  She does not get to see her fianc Susy Frizzle or her best friend Nehemiah Settle with the stay at home coronavirus emergency orders, both of whom are out of work now.  Adoptive mother has been predicting that she must start dialysis but does not confirm today having begun.  Rexulti  0.5 mg daily was provided last appointment samples to augment her Pamelor antidepressant for moderate major depression.  They report benefit for 2 hours after Rexulti as though medicated feeling but no improvement in feeling or function otherwise.  They report depression is worse with anger, sadness, crying, and overally and unstablely emotional and fixated.  She thereby concludes anxiety is worse leaving her stuck in the house when life seems out of control.  They request treatment other than the Rexulti as she continues nortriptyline10+25 mg for total of 35 mg every bedtime which was very helpful for anxiety and somatic symptoms when started 04/28/2017 in place of Trintellix.  She has Vyvanse 60 mg in the morning and Dextrostat 10 mg twice daily when working longer hours in the evening.  She is taking melatonin 0.5 mg  nightly for sleep and she wants to sleep and stop crying but has more nightmares when taking melatonin but still some when not.  She has Klonopin 0.5 mg twice daily for the anxiety.  She has no psychosis, mania, or substance use.           She was intolerant or sensitive/allergic to Dexedrine now takes his  Dextrostat, Zoloft, Viibryd, and Lexapro causing sleep paralysis.  Previous medications include lorazepam, BuSpar, Strattera, Adderall, Risperdal, Zoloft, Focalin, Concerta, clonidine, Prozac, Paxil, Cymbalta, Topamax, Luvox, Wellbutrin, Vraylar, Trintellix, and current medications.      Depression         This is a new problem.  The current episode started more than 1 month ago.   The onset quality is sudden.   The problem occurs daily.  The problem has been gradually worsening since onset.  Associated symptoms include decreased concentration, fatigue, helplessness, hopelessness, insomnia, irritable, decreased interest, headaches, indigestion and sad.  Associated symptoms include no appetite change, no myalgias and no suicidal ideas.     The symptoms are aggravated by work stress, social issues, medication and family issues.  Past treatments include SSRIs - Selective serotonin reuptake inhibitors, SNRIs - Serotonin and norepinephrine reuptake inhibitors, psychotherapy, other medications and TCAs - Tricyclic antidepressants.  Compliance with treatment is variable.  Past compliance problems include difficulty with treatment plan, medication issues, medical issues and insurance issues.  Previous treatment provided mild relief.  Risk factors include a change in medication usage/dosage, family history, history of mental illness, history of self-injury, major life event and stress.   Individual Medical History/ Review of Systems: Changes? :Yes  bariatric medicine treatting the patient last year for a while tempting to sustain expectation and mechanism for possible weight loss.  Patient has been ambivalent wanting marriage and to adopt a child at times while at others doubting her capacity to be so responsible.  She has to take care of her fianc recently father for his heart attack sister for her concussion and mother now for her kidney disease.  Allergies: Patient has no known allergies.  Current  Medications:  Current Outpatient Medications:  .  beclomethasone (QVAR) 80 MCG/ACT inhaler, Inhale 1 puff into the lungs as needed., Disp: , Rfl:  .  clonazePAM (KLONOPIN) 0.5 MG tablet, Take 1 tablet (0.5 mg total) by mouth 3 (three) times daily as needed for anxiety., Disp: 90 tablet, Rfl: 2 .  dextroamphetamine (DEXTROSTAT) 10 MG tablet, Take 1 tablet (10 mg total) by mouth 2 (two) times daily., Disp: 60 tablet, Rfl: 0 .  diclofenac (VOLTAREN) 75 MG EC tablet, Take 1 tablet (75 mg total) by mouth 2 (two) times daily., Disp: 60 tablet, Rfl: 1 .  lisdexamfetamine (VYVANSE) 60 MG capsule, Take 1 capsule (60 mg total) by mouth daily after breakfast for 30 days., Disp: 30 capsule, Rfl: 0 .  lisdexamfetamine (VYVANSE) 60 MG capsule, Take 1 capsule (60 mg total) by mouth daily after breakfast for 30 days., Disp: 30 capsule, Rfl: 0 .  lisdexamfetamine (VYVANSE) 60 MG capsule, Take 1 capsule (60 mg total) by mouth daily after breakfast for 30 days., Disp: 30 capsule, Rfl: 0 .  lithium carbonate (LITHOBID) 300 MG CR tablet, Take 2 tablets (600 mg total) by mouth at bedtime. In place of Rexulti, Disp: 60 tablet, Rfl: 1 .  nortriptyline (PAMELOR) 10 MG capsule, Take 1 capsule (10 mg total) by mouth at bedtime., Disp: 30 capsule, Rfl: 2 .  nortriptyline (PAMELOR) 25 MG capsule, Take 1  capsule (25 mg total) by mouth at bedtime., Disp: 30 capsule, Rfl: 2 .  ondansetron (ZOFRAN ODT) 4 MG disintegrating tablet, Take one tab by mouth Q6hr prn nausea (Dissolve under tongue), Disp: 12 tablet, Rfl: 0 .  Vitamin D, Ergocalciferol, (DRISDOL) 50000 units CAPS capsule, Take 1 capsule (50,000 Units total) by mouth every 7 (seven) days., Disp: 4 capsule, Rfl: 0   Medication Side Effects: weight gain  Family Medical/ Social History: Changes? Yes patient presents that she values her job but mother does not know how the patient is able to work with such severe depression and anxiety currently.  Other seems to be giving up on  past care particularly that for child and adolescent and he was likely to seek an adult psychiatrist though she is not definite today other than asking about the subspecialty of [facility] outpatient.  MENTAL HEALTH EXAM:  There were no vitals taken for this visit.There is no height or weight on file to calculate BMI. as not present  General Appearance: N/A  Eye Contact:  NA  Speech:  Clear and Coherent, Normal Rate and Talkative  Volume:  Normal  Mood:  Anxious, Depressed, Dysphoric, Hopeless, Irritable and Worthless  Affect:  Depressed, Inappropriate, Labile, Full Range and Anxious  Thought Process:  Disorganized, Goal Directed, Irrelevant and Linear  Orientation:  Full (Time, Place, and Person)  Thought Content: Ilusions, Obsessions and Rumination   Suicidal Thoughts:  No  Homicidal Thoughts:  No  Memory:  Immediate;   Fair Remote;   Fair  Judgement:  Fair  Insight:  Fair and Lacking  Psychomotor Activity:  Normal, Increased, Mannerisms and Restlessness  Concentration:  Concentration: Fair and Attention Span: Fair  Recall:  [employer] of Knowledge: Poor  Language: Fair  Assets:  Desire for Improvement Leisure Time Talents/Skills  ADL's:  Impaired  Cognition: WNL  Prognosis:  Poor    DIAGNOSES:    ICD-10-CM   1. Severe major depression, single episode, without psychotic features (HCC) F32.2 lithium carbonate (LITHOBID) 300 MG CR tablet  2. Generalized anxiety disorder F41.1   3. Panic disorder F41.0   4. Binge eating disorder F50.81   5. Attention deficit hyperactivity disorder (ADHD), combined type, moderate F90.2     Receiving Psychotherapy: Yes Charlyne Mom, PhD   RECOMMENDATIONS: The inconsistency of history and manifest versus reported symptoms including variable reporting between patient and mother make differential diagnosis and quantification of adaptive consequences challenging over time.  Through the course  of 14 years of development, anxiety and maladaptive behavioral symptoms have been often managed by containment over the course of time but such expectation of developmental working through may now be unrealistic for patient's age and responsibilities in life.  Though current depressive diagnosis may be newly manifest, the confusing social, academic, and character variations over time may have mask or skewed depressive symptoms and ways that became attributed to other diagnoses.  Having received a number of anti-depressant medications over time, augmentation of her existing Pamelor that has been helpful may be best though Pamelor may need titration if possible after being on it a year.  Lithium 300 mg ER to take 2 tablets or total 600 mg nightly in place of previous Rexulti 0.5 mg is planned sent as #60 and 1 [facility] [address] as she continues nortriptyline 35 mg nightly also for major depression not tolerant of higher doses in the past nightly, Klonopin 0.5 mg 3 times  daily as needed, Vyvanse 60 mg in the morning and Dextrostat 10 mg twice daily as needed in current supply.  They are exploring options including with others providing her current care as to whether or not they will return in follow-up in 2 months.  Virtual Visit via Telephone Note  I connected with Einar Crow on 09/08/18 at  4:00 PM EDT by telephone and verified that I am speaking with the correct person using two identifiers.   I discussed the limitations, risks, security and privacy concerns of performing an evaluation and management service by telephone and the availability of in person appointments. I also discussed with the patient that there may be a patient responsible charge related to this service. The patient expressed understanding and agreed to proceed.   History of Present Illness: 6-week evaluation and management of new onset depression, panic and generalized anxiety, binge eating, and ADHD.  Adoptive  mother again emphasizes how impaired in function and miserable in emotion the patient is now. The patient has difficulty even in person being consistent in her self reports, though the audio intervention today attempts to verify for she and family and clarify for treatment matching complaints of depression and anxiety getting worse despite continued treatment.   Observations/Objective: Mood:  Anxious, Depressed, Dysphoric, Hopeless, Irritable and Worthless  Affect:  Depressed, Inappropriate, Labile, Full Range and Anxious  Thought Process:  Disorganized, Goal Directed, Irrelevant and Linear   Assessment and Plan: Having received a number of anti-depressant medications over time, augmentation of her existing Pamelor that has been helpful may be best though Pamelor may need titration if possible after being on it a year.  Lithium 300 mg ER to take 2 tablets or total 600 mg nightly in place of previous Rexulti 0.5 mg is planned sent as #60 and [facility] [address] as she continues nortriptyline 35 mg nightly also for major depression not tolerant of higher doses in the past nightly, Klonopin 0.5 mg 3 times daily as needed, Vyvanse 60 mg in the morning and Dextrostat 10 mg twice daily as needed in current supply.  Follow Up Instructions:  They are exploring options including with others providing her current care as to whether or not they will return in follow-up in 2 months.   I discussed the assessment and treatment plan with the patient. The patient was provided an opportunity to ask questions and all were answered. The patient agreed with the plan and demonstrated an understanding of the instructions.   The patient was advised to call back or seek an in-person evaluation if the symptoms worsen or if the condition fails to improve as anticipated.  I provided 20 minutes of non-face-to-face time during this encounter.   Chauncey Mann, MD  Chauncey Mann, MD

## 2018-09-10 ENCOUNTER — Ambulatory Visit (INDEPENDENT_AMBULATORY_CARE_PROVIDER_SITE_OTHER): Payer: No Typology Code available for payment source | Admitting: Clinical

## 2018-09-10 DIAGNOSIS — F411 Generalized anxiety disorder: Secondary | ICD-10-CM

## 2018-09-20 ENCOUNTER — Telehealth: Payer: Self-pay | Admitting: Psychiatry

## 2018-09-20 NOTE — Telephone Encounter (Signed)
Adoptive mother phones about their search for adult psychiatrist and alternative therapist as they feel Joni Reining is stuck in her current treatment except that the lithium is helping her depression augmenting the Pamelor.  As their attempt to see Dr. Rene Kocher, for adult psychiatric care including lack of improvement in her depression of 6 months though several things have helped somewhat in a time-limited fashion,has been unsuccessful as he is on partial duty having maternity leave for his wife, they conclude to switch to try to transfer to Aris Everts, LCSW and psychiatrist in her office possibly Dr. Donell Beers.  For mother request that records not be sent to Dr. Rene Kocher though she sent release to do so, unless in the future they do get appointment there.

## 2018-09-24 ENCOUNTER — Ambulatory Visit (INDEPENDENT_AMBULATORY_CARE_PROVIDER_SITE_OTHER): Payer: No Typology Code available for payment source | Admitting: Clinical

## 2018-09-24 DIAGNOSIS — F411 Generalized anxiety disorder: Secondary | ICD-10-CM

## 2018-10-03 ENCOUNTER — Other Ambulatory Visit: Payer: Self-pay | Admitting: Psychiatry

## 2018-10-03 DIAGNOSIS — F411 Generalized anxiety disorder: Secondary | ICD-10-CM

## 2018-10-03 DIAGNOSIS — F5081 Binge eating disorder: Secondary | ICD-10-CM

## 2018-10-03 DIAGNOSIS — F410 Panic disorder [episodic paroxysmal anxiety] without agoraphobia: Secondary | ICD-10-CM

## 2018-10-04 NOTE — Telephone Encounter (Signed)
Please review

## 2018-10-04 NOTE — Telephone Encounter (Signed)
At last appointment 09/07/2018, patient and adoptive mother have interest in transferring to adult psychiatry care particularly in reference to depression they consider not responding to any multiple treatments sufficiently thus far, patient exhibiting regressive fixations similar to episodes in her child and adolescent care by myself through the past.  Klonopin 0.5 mg twice daily #60 no [facility] [address] as medically necessary with no contraindications.

## 2018-10-05 ENCOUNTER — Ambulatory Visit: Payer: No Typology Code available for payment source | Admitting: Clinical

## 2018-10-08 ENCOUNTER — Ambulatory Visit (INDEPENDENT_AMBULATORY_CARE_PROVIDER_SITE_OTHER): Payer: No Typology Code available for payment source | Admitting: Clinical

## 2018-10-08 DIAGNOSIS — F411 Generalized anxiety disorder: Secondary | ICD-10-CM

## 2018-10-19 ENCOUNTER — Ambulatory Visit: Payer: No Typology Code available for payment source | Admitting: Clinical

## 2018-10-22 ENCOUNTER — Ambulatory Visit (INDEPENDENT_AMBULATORY_CARE_PROVIDER_SITE_OTHER): Payer: No Typology Code available for payment source | Admitting: Clinical

## 2018-10-22 DIAGNOSIS — F411 Generalized anxiety disorder: Secondary | ICD-10-CM

## 2018-10-29 ENCOUNTER — Other Ambulatory Visit: Payer: Self-pay | Admitting: Psychiatry

## 2018-10-29 DIAGNOSIS — F322 Major depressive disorder, single episode, severe without psychotic features: Secondary | ICD-10-CM

## 2018-10-29 NOTE — Telephone Encounter (Signed)
Refills okay 

## 2018-10-29 NOTE — Telephone Encounter (Signed)
Family and patient seek third fill here for lithium augmentation of nortriptyline for depression despite trying to find an adult psychiatrist and therapist to [facility] [address] per 60 with 1 refill.

## 2018-11-05 ENCOUNTER — Ambulatory Visit: Payer: No Typology Code available for payment source | Admitting: Clinical

## 2018-11-12 ENCOUNTER — Ambulatory Visit (INDEPENDENT_AMBULATORY_CARE_PROVIDER_SITE_OTHER): Payer: BC Managed Care – PPO | Admitting: Clinical

## 2018-11-12 DIAGNOSIS — F411 Generalized anxiety disorder: Secondary | ICD-10-CM

## 2018-11-18 DIAGNOSIS — F4011 Social phobia, generalized: Secondary | ICD-10-CM | POA: Diagnosis not present

## 2018-12-03 ENCOUNTER — Ambulatory Visit (INDEPENDENT_AMBULATORY_CARE_PROVIDER_SITE_OTHER): Payer: BC Managed Care – PPO | Admitting: Clinical

## 2018-12-03 DIAGNOSIS — F411 Generalized anxiety disorder: Secondary | ICD-10-CM | POA: Diagnosis not present

## 2018-12-16 DIAGNOSIS — K219 Gastro-esophageal reflux disease without esophagitis: Secondary | ICD-10-CM | POA: Diagnosis not present

## 2018-12-20 DIAGNOSIS — F4011 Social phobia, generalized: Secondary | ICD-10-CM | POA: Diagnosis not present

## 2018-12-24 ENCOUNTER — Ambulatory Visit (INDEPENDENT_AMBULATORY_CARE_PROVIDER_SITE_OTHER): Payer: BC Managed Care – PPO | Admitting: Clinical

## 2018-12-24 DIAGNOSIS — F411 Generalized anxiety disorder: Secondary | ICD-10-CM

## 2019-01-01 DIAGNOSIS — R5383 Other fatigue: Secondary | ICD-10-CM | POA: Diagnosis not present

## 2019-01-01 DIAGNOSIS — R5381 Other malaise: Secondary | ICD-10-CM | POA: Diagnosis not present

## 2019-01-18 DIAGNOSIS — F4011 Social phobia, generalized: Secondary | ICD-10-CM | POA: Diagnosis not present

## 2019-01-22 ENCOUNTER — Ambulatory Visit (INDEPENDENT_AMBULATORY_CARE_PROVIDER_SITE_OTHER): Payer: BC Managed Care – PPO | Admitting: Clinical

## 2019-01-22 DIAGNOSIS — F411 Generalized anxiety disorder: Secondary | ICD-10-CM

## 2019-02-04 ENCOUNTER — Ambulatory Visit (INDEPENDENT_AMBULATORY_CARE_PROVIDER_SITE_OTHER): Payer: BC Managed Care – PPO | Admitting: Clinical

## 2019-02-04 DIAGNOSIS — F411 Generalized anxiety disorder: Secondary | ICD-10-CM

## 2019-02-22 ENCOUNTER — Ambulatory Visit (INDEPENDENT_AMBULATORY_CARE_PROVIDER_SITE_OTHER): Payer: BC Managed Care – PPO | Admitting: Clinical

## 2019-02-22 DIAGNOSIS — F411 Generalized anxiety disorder: Secondary | ICD-10-CM | POA: Diagnosis not present

## 2019-02-28 DIAGNOSIS — F4011 Social phobia, generalized: Secondary | ICD-10-CM | POA: Diagnosis not present

## 2019-03-01 ENCOUNTER — Ambulatory Visit (INDEPENDENT_AMBULATORY_CARE_PROVIDER_SITE_OTHER): Payer: BC Managed Care – PPO | Admitting: Clinical

## 2019-03-01 DIAGNOSIS — F411 Generalized anxiety disorder: Secondary | ICD-10-CM

## 2019-03-07 DIAGNOSIS — F411 Generalized anxiety disorder: Secondary | ICD-10-CM | POA: Diagnosis not present

## 2019-03-15 ENCOUNTER — Ambulatory Visit: Payer: Self-pay | Admitting: Clinical

## 2019-03-18 DIAGNOSIS — F411 Generalized anxiety disorder: Secondary | ICD-10-CM | POA: Diagnosis not present

## 2019-03-23 DIAGNOSIS — F4011 Social phobia, generalized: Secondary | ICD-10-CM | POA: Diagnosis not present

## 2019-03-26 DIAGNOSIS — F411 Generalized anxiety disorder: Secondary | ICD-10-CM | POA: Diagnosis not present

## 2019-03-30 ENCOUNTER — Ambulatory Visit: Payer: BC Managed Care – PPO | Admitting: Clinical

## 2019-04-02 DIAGNOSIS — F411 Generalized anxiety disorder: Secondary | ICD-10-CM | POA: Diagnosis not present

## 2019-04-02 DIAGNOSIS — F422 Mixed obsessional thoughts and acts: Secondary | ICD-10-CM | POA: Diagnosis not present

## 2019-04-02 DIAGNOSIS — F902 Attention-deficit hyperactivity disorder, combined type: Secondary | ICD-10-CM | POA: Diagnosis not present

## 2019-04-05 ENCOUNTER — Ambulatory Visit: Payer: Self-pay | Admitting: Clinical

## 2019-04-05 DIAGNOSIS — F4011 Social phobia, generalized: Secondary | ICD-10-CM | POA: Diagnosis not present

## 2019-04-07 DIAGNOSIS — M2142 Flat foot [pes planus] (acquired), left foot: Secondary | ICD-10-CM | POA: Diagnosis not present

## 2019-04-07 DIAGNOSIS — K580 Irritable bowel syndrome with diarrhea: Secondary | ICD-10-CM | POA: Diagnosis not present

## 2019-04-07 DIAGNOSIS — M2141 Flat foot [pes planus] (acquired), right foot: Secondary | ICD-10-CM | POA: Diagnosis not present

## 2019-04-07 DIAGNOSIS — K219 Gastro-esophageal reflux disease without esophagitis: Secondary | ICD-10-CM | POA: Diagnosis not present

## 2019-04-17 DIAGNOSIS — F902 Attention-deficit hyperactivity disorder, combined type: Secondary | ICD-10-CM | POA: Diagnosis not present

## 2019-04-17 DIAGNOSIS — F411 Generalized anxiety disorder: Secondary | ICD-10-CM | POA: Diagnosis not present

## 2019-04-17 DIAGNOSIS — F422 Mixed obsessional thoughts and acts: Secondary | ICD-10-CM | POA: Diagnosis not present

## 2019-04-18 DIAGNOSIS — S83411A Sprain of medial collateral ligament of right knee, initial encounter: Secondary | ICD-10-CM | POA: Diagnosis not present

## 2019-04-26 ENCOUNTER — Ambulatory Visit: Payer: Self-pay | Admitting: Clinical

## 2019-04-30 DIAGNOSIS — F902 Attention-deficit hyperactivity disorder, combined type: Secondary | ICD-10-CM | POA: Diagnosis not present

## 2019-04-30 DIAGNOSIS — F422 Mixed obsessional thoughts and acts: Secondary | ICD-10-CM | POA: Diagnosis not present

## 2019-04-30 DIAGNOSIS — F411 Generalized anxiety disorder: Secondary | ICD-10-CM | POA: Diagnosis not present

## 2019-05-07 DIAGNOSIS — F422 Mixed obsessional thoughts and acts: Secondary | ICD-10-CM | POA: Diagnosis not present

## 2019-05-07 DIAGNOSIS — F902 Attention-deficit hyperactivity disorder, combined type: Secondary | ICD-10-CM | POA: Diagnosis not present

## 2019-05-07 DIAGNOSIS — F411 Generalized anxiety disorder: Secondary | ICD-10-CM | POA: Diagnosis not present

## 2019-05-14 DIAGNOSIS — F411 Generalized anxiety disorder: Secondary | ICD-10-CM | POA: Diagnosis not present

## 2019-05-14 DIAGNOSIS — F902 Attention-deficit hyperactivity disorder, combined type: Secondary | ICD-10-CM | POA: Diagnosis not present

## 2019-05-14 DIAGNOSIS — F422 Mixed obsessional thoughts and acts: Secondary | ICD-10-CM | POA: Diagnosis not present

## 2019-05-17 ENCOUNTER — Ambulatory Visit: Payer: Self-pay | Admitting: Clinical

## 2019-05-28 DIAGNOSIS — F902 Attention-deficit hyperactivity disorder, combined type: Secondary | ICD-10-CM | POA: Diagnosis not present

## 2019-05-28 DIAGNOSIS — F411 Generalized anxiety disorder: Secondary | ICD-10-CM | POA: Diagnosis not present

## 2019-05-28 DIAGNOSIS — F422 Mixed obsessional thoughts and acts: Secondary | ICD-10-CM | POA: Diagnosis not present

## 2019-06-05 DIAGNOSIS — F411 Generalized anxiety disorder: Secondary | ICD-10-CM | POA: Diagnosis not present

## 2019-06-05 DIAGNOSIS — F422 Mixed obsessional thoughts and acts: Secondary | ICD-10-CM | POA: Diagnosis not present

## 2019-06-05 DIAGNOSIS — F902 Attention-deficit hyperactivity disorder, combined type: Secondary | ICD-10-CM | POA: Diagnosis not present

## 2019-06-07 ENCOUNTER — Ambulatory Visit: Payer: Self-pay | Admitting: Clinical

## 2019-06-10 DIAGNOSIS — F422 Mixed obsessional thoughts and acts: Secondary | ICD-10-CM | POA: Diagnosis not present

## 2019-06-10 DIAGNOSIS — F902 Attention-deficit hyperactivity disorder, combined type: Secondary | ICD-10-CM | POA: Diagnosis not present

## 2019-06-10 DIAGNOSIS — F411 Generalized anxiety disorder: Secondary | ICD-10-CM | POA: Diagnosis not present

## 2019-06-15 DIAGNOSIS — F4011 Social phobia, generalized: Secondary | ICD-10-CM | POA: Diagnosis not present

## 2019-06-18 DIAGNOSIS — F902 Attention-deficit hyperactivity disorder, combined type: Secondary | ICD-10-CM | POA: Diagnosis not present

## 2019-06-18 DIAGNOSIS — F411 Generalized anxiety disorder: Secondary | ICD-10-CM | POA: Diagnosis not present

## 2019-06-18 DIAGNOSIS — F422 Mixed obsessional thoughts and acts: Secondary | ICD-10-CM | POA: Diagnosis not present

## 2019-06-28 ENCOUNTER — Ambulatory Visit: Payer: Self-pay | Admitting: Clinical

## 2019-07-02 DIAGNOSIS — F411 Generalized anxiety disorder: Secondary | ICD-10-CM | POA: Diagnosis not present

## 2019-07-02 DIAGNOSIS — F902 Attention-deficit hyperactivity disorder, combined type: Secondary | ICD-10-CM | POA: Diagnosis not present

## 2019-07-02 DIAGNOSIS — F422 Mixed obsessional thoughts and acts: Secondary | ICD-10-CM | POA: Diagnosis not present

## 2019-07-04 DIAGNOSIS — K580 Irritable bowel syndrome with diarrhea: Secondary | ICD-10-CM | POA: Diagnosis not present

## 2019-07-04 DIAGNOSIS — G43009 Migraine without aura, not intractable, without status migrainosus: Secondary | ICD-10-CM | POA: Diagnosis not present

## 2019-07-04 DIAGNOSIS — N912 Amenorrhea, unspecified: Secondary | ICD-10-CM | POA: Diagnosis not present

## 2019-07-04 DIAGNOSIS — E282 Polycystic ovarian syndrome: Secondary | ICD-10-CM | POA: Diagnosis not present

## 2019-07-12 DIAGNOSIS — F4011 Social phobia, generalized: Secondary | ICD-10-CM | POA: Diagnosis not present

## 2019-07-17 DIAGNOSIS — F422 Mixed obsessional thoughts and acts: Secondary | ICD-10-CM | POA: Diagnosis not present

## 2019-07-17 DIAGNOSIS — F902 Attention-deficit hyperactivity disorder, combined type: Secondary | ICD-10-CM | POA: Diagnosis not present

## 2019-07-17 DIAGNOSIS — F411 Generalized anxiety disorder: Secondary | ICD-10-CM | POA: Diagnosis not present

## 2019-07-19 ENCOUNTER — Ambulatory Visit: Payer: Self-pay | Admitting: Clinical

## 2019-07-20 DIAGNOSIS — F411 Generalized anxiety disorder: Secondary | ICD-10-CM | POA: Diagnosis not present

## 2019-07-20 DIAGNOSIS — F902 Attention-deficit hyperactivity disorder, combined type: Secondary | ICD-10-CM | POA: Diagnosis not present

## 2019-07-20 DIAGNOSIS — F422 Mixed obsessional thoughts and acts: Secondary | ICD-10-CM | POA: Diagnosis not present

## 2019-07-24 DIAGNOSIS — F422 Mixed obsessional thoughts and acts: Secondary | ICD-10-CM | POA: Diagnosis not present

## 2019-07-24 DIAGNOSIS — F902 Attention-deficit hyperactivity disorder, combined type: Secondary | ICD-10-CM | POA: Diagnosis not present

## 2019-07-24 DIAGNOSIS — F411 Generalized anxiety disorder: Secondary | ICD-10-CM | POA: Diagnosis not present

## 2019-07-31 DIAGNOSIS — F422 Mixed obsessional thoughts and acts: Secondary | ICD-10-CM | POA: Diagnosis not present

## 2019-07-31 DIAGNOSIS — F411 Generalized anxiety disorder: Secondary | ICD-10-CM | POA: Diagnosis not present

## 2019-07-31 DIAGNOSIS — F902 Attention-deficit hyperactivity disorder, combined type: Secondary | ICD-10-CM | POA: Diagnosis not present

## 2019-08-01 DIAGNOSIS — K580 Irritable bowel syndrome with diarrhea: Secondary | ICD-10-CM | POA: Diagnosis not present

## 2019-08-01 DIAGNOSIS — E282 Polycystic ovarian syndrome: Secondary | ICD-10-CM | POA: Diagnosis not present

## 2019-08-01 DIAGNOSIS — G43009 Migraine without aura, not intractable, without status migrainosus: Secondary | ICD-10-CM | POA: Diagnosis not present

## 2019-08-07 DIAGNOSIS — F422 Mixed obsessional thoughts and acts: Secondary | ICD-10-CM | POA: Diagnosis not present

## 2019-08-07 DIAGNOSIS — F902 Attention-deficit hyperactivity disorder, combined type: Secondary | ICD-10-CM | POA: Diagnosis not present

## 2019-08-07 DIAGNOSIS — F411 Generalized anxiety disorder: Secondary | ICD-10-CM | POA: Diagnosis not present

## 2019-08-09 ENCOUNTER — Ambulatory Visit: Payer: Self-pay | Admitting: Clinical

## 2019-08-14 DIAGNOSIS — F902 Attention-deficit hyperactivity disorder, combined type: Secondary | ICD-10-CM | POA: Diagnosis not present

## 2019-08-14 DIAGNOSIS — F411 Generalized anxiety disorder: Secondary | ICD-10-CM | POA: Diagnosis not present

## 2019-08-14 DIAGNOSIS — F422 Mixed obsessional thoughts and acts: Secondary | ICD-10-CM | POA: Diagnosis not present

## 2019-08-21 DIAGNOSIS — F422 Mixed obsessional thoughts and acts: Secondary | ICD-10-CM | POA: Diagnosis not present

## 2019-08-21 DIAGNOSIS — F411 Generalized anxiety disorder: Secondary | ICD-10-CM | POA: Diagnosis not present

## 2019-08-21 DIAGNOSIS — F902 Attention-deficit hyperactivity disorder, combined type: Secondary | ICD-10-CM | POA: Diagnosis not present

## 2019-08-30 ENCOUNTER — Ambulatory Visit: Payer: Self-pay | Admitting: Clinical

## 2019-09-04 DIAGNOSIS — F902 Attention-deficit hyperactivity disorder, combined type: Secondary | ICD-10-CM | POA: Diagnosis not present

## 2019-09-04 DIAGNOSIS — F422 Mixed obsessional thoughts and acts: Secondary | ICD-10-CM | POA: Diagnosis not present

## 2019-09-04 DIAGNOSIS — F411 Generalized anxiety disorder: Secondary | ICD-10-CM | POA: Diagnosis not present

## 2019-09-11 DIAGNOSIS — F902 Attention-deficit hyperactivity disorder, combined type: Secondary | ICD-10-CM | POA: Diagnosis not present

## 2019-09-11 DIAGNOSIS — F411 Generalized anxiety disorder: Secondary | ICD-10-CM | POA: Diagnosis not present

## 2019-09-11 DIAGNOSIS — F422 Mixed obsessional thoughts and acts: Secondary | ICD-10-CM | POA: Diagnosis not present

## 2019-09-18 DIAGNOSIS — F422 Mixed obsessional thoughts and acts: Secondary | ICD-10-CM | POA: Diagnosis not present

## 2019-09-18 DIAGNOSIS — F902 Attention-deficit hyperactivity disorder, combined type: Secondary | ICD-10-CM | POA: Diagnosis not present

## 2019-09-18 DIAGNOSIS — F411 Generalized anxiety disorder: Secondary | ICD-10-CM | POA: Diagnosis not present

## 2019-09-20 ENCOUNTER — Ambulatory Visit: Payer: Self-pay | Admitting: Clinical

## 2019-09-23 DIAGNOSIS — K580 Irritable bowel syndrome with diarrhea: Secondary | ICD-10-CM | POA: Diagnosis not present

## 2019-09-27 ENCOUNTER — Other Ambulatory Visit (HOSPITAL_COMMUNITY)
Admission: RE | Admit: 2019-09-27 | Discharge: 2019-09-27 | Disposition: A | Discharge status: Home / Self Care | Payer: BC Managed Care – PPO | Source: Ambulatory Visit | Attending: Family Medicine | Admitting: Family Medicine

## 2019-09-27 ENCOUNTER — Other Ambulatory Visit: Payer: Self-pay | Admitting: Family Medicine

## 2019-09-27 DIAGNOSIS — E282 Polycystic ovarian syndrome: Secondary | ICD-10-CM | POA: Diagnosis not present

## 2019-09-27 DIAGNOSIS — Z0000 Encounter for general adult medical examination without abnormal findings: Secondary | ICD-10-CM | POA: Diagnosis not present

## 2019-09-27 DIAGNOSIS — Z124 Encounter for screening for malignant neoplasm of cervix: Secondary | ICD-10-CM | POA: Insufficient documentation

## 2019-09-27 DIAGNOSIS — K580 Irritable bowel syndrome with diarrhea: Secondary | ICD-10-CM | POA: Diagnosis not present

## 2019-09-27 DIAGNOSIS — Z13220 Encounter for screening for lipoid disorders: Secondary | ICD-10-CM | POA: Diagnosis not present

## 2019-09-27 DIAGNOSIS — Z23 Encounter for immunization: Secondary | ICD-10-CM | POA: Diagnosis not present

## 2019-09-29 LAB — CYTOLOGY - PAP: Diagnosis: NEGATIVE

## 2019-10-02 DIAGNOSIS — F902 Attention-deficit hyperactivity disorder, combined type: Secondary | ICD-10-CM | POA: Diagnosis not present

## 2019-10-02 DIAGNOSIS — F411 Generalized anxiety disorder: Secondary | ICD-10-CM | POA: Diagnosis not present

## 2019-10-02 DIAGNOSIS — F422 Mixed obsessional thoughts and acts: Secondary | ICD-10-CM | POA: Diagnosis not present

## 2019-10-09 DIAGNOSIS — F902 Attention-deficit hyperactivity disorder, combined type: Secondary | ICD-10-CM | POA: Diagnosis not present

## 2019-10-09 DIAGNOSIS — F411 Generalized anxiety disorder: Secondary | ICD-10-CM | POA: Diagnosis not present

## 2019-10-09 DIAGNOSIS — F422 Mixed obsessional thoughts and acts: Secondary | ICD-10-CM | POA: Diagnosis not present

## 2019-10-11 ENCOUNTER — Ambulatory Visit: Payer: Self-pay | Admitting: Clinical

## 2019-10-11 DIAGNOSIS — F4011 Social phobia, generalized: Secondary | ICD-10-CM | POA: Diagnosis not present

## 2019-10-16 DIAGNOSIS — F411 Generalized anxiety disorder: Secondary | ICD-10-CM | POA: Diagnosis not present

## 2019-10-16 DIAGNOSIS — F902 Attention-deficit hyperactivity disorder, combined type: Secondary | ICD-10-CM | POA: Diagnosis not present

## 2019-10-16 DIAGNOSIS — F422 Mixed obsessional thoughts and acts: Secondary | ICD-10-CM | POA: Diagnosis not present

## 2019-10-23 DIAGNOSIS — F902 Attention-deficit hyperactivity disorder, combined type: Secondary | ICD-10-CM | POA: Diagnosis not present

## 2019-10-23 DIAGNOSIS — F411 Generalized anxiety disorder: Secondary | ICD-10-CM | POA: Diagnosis not present

## 2019-10-23 DIAGNOSIS — F422 Mixed obsessional thoughts and acts: Secondary | ICD-10-CM | POA: Diagnosis not present

## 2019-11-01 ENCOUNTER — Ambulatory Visit: Payer: Self-pay | Admitting: Clinical

## 2019-11-05 DIAGNOSIS — F902 Attention-deficit hyperactivity disorder, combined type: Secondary | ICD-10-CM | POA: Diagnosis not present

## 2019-11-05 DIAGNOSIS — F411 Generalized anxiety disorder: Secondary | ICD-10-CM | POA: Diagnosis not present

## 2019-11-05 DIAGNOSIS — F422 Mixed obsessional thoughts and acts: Secondary | ICD-10-CM | POA: Diagnosis not present

## 2019-11-10 DIAGNOSIS — L559 Sunburn, unspecified: Secondary | ICD-10-CM | POA: Diagnosis not present

## 2019-11-13 DIAGNOSIS — F902 Attention-deficit hyperactivity disorder, combined type: Secondary | ICD-10-CM | POA: Diagnosis not present

## 2019-11-13 DIAGNOSIS — F411 Generalized anxiety disorder: Secondary | ICD-10-CM | POA: Diagnosis not present

## 2019-11-13 DIAGNOSIS — F422 Mixed obsessional thoughts and acts: Secondary | ICD-10-CM | POA: Diagnosis not present

## 2019-11-22 ENCOUNTER — Ambulatory Visit: Payer: Self-pay | Admitting: Clinical

## 2019-11-25 DIAGNOSIS — F902 Attention-deficit hyperactivity disorder, combined type: Secondary | ICD-10-CM | POA: Diagnosis not present

## 2019-11-25 DIAGNOSIS — F411 Generalized anxiety disorder: Secondary | ICD-10-CM | POA: Diagnosis not present

## 2019-11-25 DIAGNOSIS — F422 Mixed obsessional thoughts and acts: Secondary | ICD-10-CM | POA: Diagnosis not present

## 2019-12-09 DIAGNOSIS — F411 Generalized anxiety disorder: Secondary | ICD-10-CM | POA: Diagnosis not present

## 2019-12-09 DIAGNOSIS — F422 Mixed obsessional thoughts and acts: Secondary | ICD-10-CM | POA: Diagnosis not present

## 2019-12-09 DIAGNOSIS — F902 Attention-deficit hyperactivity disorder, combined type: Secondary | ICD-10-CM | POA: Diagnosis not present

## 2019-12-13 ENCOUNTER — Ambulatory Visit: Payer: Self-pay | Admitting: Clinical

## 2019-12-23 DIAGNOSIS — F411 Generalized anxiety disorder: Secondary | ICD-10-CM | POA: Diagnosis not present

## 2019-12-23 DIAGNOSIS — F902 Attention-deficit hyperactivity disorder, combined type: Secondary | ICD-10-CM | POA: Diagnosis not present

## 2019-12-23 DIAGNOSIS — F422 Mixed obsessional thoughts and acts: Secondary | ICD-10-CM | POA: Diagnosis not present

## 2019-12-28 DIAGNOSIS — K580 Irritable bowel syndrome with diarrhea: Secondary | ICD-10-CM | POA: Diagnosis not present

## 2019-12-28 DIAGNOSIS — M2141 Flat foot [pes planus] (acquired), right foot: Secondary | ICD-10-CM | POA: Diagnosis not present

## 2019-12-28 DIAGNOSIS — E282 Polycystic ovarian syndrome: Secondary | ICD-10-CM | POA: Diagnosis not present

## 2019-12-28 DIAGNOSIS — E669 Obesity, unspecified: Secondary | ICD-10-CM | POA: Diagnosis not present

## 2020-01-03 ENCOUNTER — Ambulatory Visit: Payer: Self-pay | Admitting: Clinical

## 2020-01-04 DIAGNOSIS — F422 Mixed obsessional thoughts and acts: Secondary | ICD-10-CM | POA: Diagnosis not present

## 2020-01-04 DIAGNOSIS — F411 Generalized anxiety disorder: Secondary | ICD-10-CM | POA: Diagnosis not present

## 2020-01-04 DIAGNOSIS — F902 Attention-deficit hyperactivity disorder, combined type: Secondary | ICD-10-CM | POA: Diagnosis not present

## 2020-01-12 DIAGNOSIS — R2689 Other abnormalities of gait and mobility: Secondary | ICD-10-CM | POA: Diagnosis not present

## 2020-01-12 DIAGNOSIS — M24572 Contracture, left ankle: Secondary | ICD-10-CM | POA: Diagnosis not present

## 2020-01-12 DIAGNOSIS — M7661 Achilles tendinitis, right leg: Secondary | ICD-10-CM | POA: Diagnosis not present

## 2020-01-12 DIAGNOSIS — M722 Plantar fascial fibromatosis: Secondary | ICD-10-CM | POA: Diagnosis not present

## 2020-01-12 DIAGNOSIS — M24571 Contracture, right ankle: Secondary | ICD-10-CM | POA: Diagnosis not present

## 2020-01-15 DIAGNOSIS — F4011 Social phobia, generalized: Secondary | ICD-10-CM | POA: Diagnosis not present

## 2020-01-24 ENCOUNTER — Ambulatory Visit: Payer: Self-pay | Admitting: Clinical

## 2020-01-25 DIAGNOSIS — Z713 Dietary counseling and surveillance: Secondary | ICD-10-CM | POA: Diagnosis not present

## 2020-01-25 DIAGNOSIS — K589 Irritable bowel syndrome without diarrhea: Secondary | ICD-10-CM | POA: Diagnosis not present

## 2020-01-25 DIAGNOSIS — E282 Polycystic ovarian syndrome: Secondary | ICD-10-CM | POA: Diagnosis not present

## 2020-01-25 DIAGNOSIS — F909 Attention-deficit hyperactivity disorder, unspecified type: Secondary | ICD-10-CM | POA: Diagnosis not present

## 2020-01-31 DIAGNOSIS — M24572 Contracture, left ankle: Secondary | ICD-10-CM | POA: Diagnosis not present

## 2020-01-31 DIAGNOSIS — M722 Plantar fascial fibromatosis: Secondary | ICD-10-CM | POA: Diagnosis not present

## 2020-01-31 DIAGNOSIS — M7732 Calcaneal spur, left foot: Secondary | ICD-10-CM | POA: Diagnosis not present

## 2020-01-31 DIAGNOSIS — M24571 Contracture, right ankle: Secondary | ICD-10-CM | POA: Diagnosis not present

## 2020-01-31 DIAGNOSIS — R2689 Other abnormalities of gait and mobility: Secondary | ICD-10-CM | POA: Diagnosis not present

## 2020-01-31 DIAGNOSIS — M7661 Achilles tendinitis, right leg: Secondary | ICD-10-CM | POA: Diagnosis not present

## 2020-02-03 DIAGNOSIS — F902 Attention-deficit hyperactivity disorder, combined type: Secondary | ICD-10-CM | POA: Diagnosis not present

## 2020-02-03 DIAGNOSIS — F422 Mixed obsessional thoughts and acts: Secondary | ICD-10-CM | POA: Diagnosis not present

## 2020-02-03 DIAGNOSIS — F411 Generalized anxiety disorder: Secondary | ICD-10-CM | POA: Diagnosis not present

## 2020-02-07 DIAGNOSIS — M722 Plantar fascial fibromatosis: Secondary | ICD-10-CM | POA: Diagnosis not present

## 2020-02-14 ENCOUNTER — Ambulatory Visit: Payer: Self-pay | Admitting: Clinical

## 2020-02-14 DIAGNOSIS — J069 Acute upper respiratory infection, unspecified: Secondary | ICD-10-CM | POA: Diagnosis not present

## 2020-02-14 DIAGNOSIS — G43009 Migraine without aura, not intractable, without status migrainosus: Secondary | ICD-10-CM | POA: Diagnosis not present

## 2020-02-22 DIAGNOSIS — M722 Plantar fascial fibromatosis: Secondary | ICD-10-CM | POA: Diagnosis not present

## 2020-02-22 DIAGNOSIS — M24572 Contracture, left ankle: Secondary | ICD-10-CM | POA: Diagnosis not present

## 2020-02-22 DIAGNOSIS — E282 Polycystic ovarian syndrome: Secondary | ICD-10-CM | POA: Diagnosis not present

## 2020-02-22 DIAGNOSIS — Z713 Dietary counseling and surveillance: Secondary | ICD-10-CM | POA: Diagnosis not present

## 2020-02-22 DIAGNOSIS — E8881 Metabolic syndrome: Secondary | ICD-10-CM | POA: Diagnosis not present

## 2020-02-22 DIAGNOSIS — K589 Irritable bowel syndrome without diarrhea: Secondary | ICD-10-CM | POA: Diagnosis not present

## 2020-02-22 DIAGNOSIS — R2689 Other abnormalities of gait and mobility: Secondary | ICD-10-CM | POA: Diagnosis not present

## 2020-02-22 DIAGNOSIS — M24571 Contracture, right ankle: Secondary | ICD-10-CM | POA: Diagnosis not present

## 2020-02-24 DIAGNOSIS — F902 Attention-deficit hyperactivity disorder, combined type: Secondary | ICD-10-CM | POA: Diagnosis not present

## 2020-02-24 DIAGNOSIS — F411 Generalized anxiety disorder: Secondary | ICD-10-CM | POA: Diagnosis not present

## 2020-02-24 DIAGNOSIS — F422 Mixed obsessional thoughts and acts: Secondary | ICD-10-CM | POA: Diagnosis not present

## 2020-03-06 ENCOUNTER — Ambulatory Visit: Payer: Self-pay | Admitting: Clinical

## 2020-03-13 ENCOUNTER — Other Ambulatory Visit: Payer: Self-pay

## 2020-03-13 ENCOUNTER — Ambulatory Visit (INDEPENDENT_AMBULATORY_CARE_PROVIDER_SITE_OTHER): Payer: BC Managed Care – PPO | Admitting: Adult Health

## 2020-03-13 DIAGNOSIS — F321 Major depressive disorder, single episode, moderate: Secondary | ICD-10-CM

## 2020-03-13 DIAGNOSIS — F902 Attention-deficit hyperactivity disorder, combined type: Secondary | ICD-10-CM

## 2020-03-13 DIAGNOSIS — G4700 Insomnia, unspecified: Secondary | ICD-10-CM | POA: Diagnosis not present

## 2020-03-13 DIAGNOSIS — R4586 Emotional lability: Secondary | ICD-10-CM

## 2020-03-13 NOTE — Progress Notes (Signed)
Laura Perez [id number] 05-Nov-1990 29 y.o.  Subjective:   Patient ID:  Laura Perez is a 29 y.o. (DOB 12-Sep-1990) female.  Chief Complaint: No chief complaint on file.   HPI   Accompanied bu mother.   Laura Perez presents to the office today for follow-up of ADHD, MDD, emotional lability, and physiological insomnia.  [facility] retiring - [facility]   Describes mood today as "ok". Pleasant. Mood symptoms - denies depression, anxiety, and irritability. Stating "I'm really happy right now". History of panic attacks. Worked at [employer] - now at Tuesday morning. Significant improvement in her mental health since leaving [employer]. Stating "my mental health is good". Good support sytem. Boyfriend 6.5 years. Hoping to live independently one day. Plantar fascitis. Stable interest and motivation. Taking medications as prescribed.  Energy levels stable. Active, does not have a regular exercise routine.  Enjoys some usual interests and activities. Single. Has a boyfriend. Lives with parents. Spending time with family and her best friends. Recent family trip. Likes to go to [employer]. Appetite adequate. Weight loss with Ozempic - 246 pounds. Lost 6 pounds last month.  Sleeps well most nights. Averages 9 to 10 hours. Takes naps when needed. History of sleep paralysis. Processes at night. Focus and concentration stable. Completing tasks. Managing aspects of household. Works part-time - 12 hours.  Denies SI or HI.  Denies AH or VH. Dr Cori Razor - therapist  Previous medication trials: Zoloft, lamictal, Celexa, Elavil  Has not tried - Prozac, Effexor, lexapro, Clomiprimine, Abilify, Rexulti, Seroquel, Latuda, Buspar    PHQ2-9     Office Visit from 05/13/2016 in [facility]  PHQ-2 Total Score 4  PHQ-9 Total Score 16       Review of Systems:  Review of Systems  Musculoskeletal: Negative for gait problem.  Neurological: Negative  for tremors.  Psychiatric/Behavioral:       Please refer to HPI    Medications: I have reviewed the patient's current medications.  Current Outpatient Medications  Medication Sig Dispense Refill   beclomethasone (QVAR) 80 MCG/ACT inhaler Inhale 1 puff into the lungs as needed.     clonazePAM (KLONOPIN) 0.5 MG tablet TAKE 1 TABLET (0.5 MG TOTAL) BY MOUTH 2 (TWO) TIMES DAILY AS NEEDED FOR ANXIETY. 60 tablet 0   dextroamphetamine (DEXTROSTAT) 10 MG tablet Take 1 tablet (10 mg total) by mouth 2 (two) times daily. 60 tablet 0   diclofenac (VOLTAREN) 75 MG EC tablet Take 1 tablet (75 mg total) by mouth 2 (two) times daily. 60 tablet 1   nortriptyline (PAMELOR) 10 MG capsule Take 1 capsule (10 mg total) by mouth at bedtime. 30 capsule 2   nortriptyline (PAMELOR) 25 MG capsule Take 1 capsule (25 mg total) by mouth at bedtime. 30 capsule 2   ondansetron (ZOFRAN ODT) 4 MG disintegrating tablet Take one tab by mouth Q6hr prn nausea (Dissolve under tongue) 12 tablet 0   No current facility-administered medications for this visit.    Medication Side Effects: None  Allergies: No Known Allergies  Past Medical History:  Diagnosis Date   ADHD    Anxiety    Asthma    Depression    GERD (gastroesophageal reflux disease)    IBS (irritable bowel syndrome)    PCOS (polycystic ovarian syndrome)     Family History  Adopted: Yes    Social History   Socioeconomic History   Marital status: Single    Spouse name: Not on file   Number of  children: Not on file   Years of education: Not on file   Highest education level: Not on file  Occupational History   Occupation: Bagger     Employer: [employer]  Tobacco Use   Smoking status: Never Smoker   Smokeless tobacco: Never Used  Vaping Use   Vaping Use: Never used  Substance and Sexual Activity   Alcohol use: No    Alcohol/week: 0.0 standard drinks   Drug use: Never   Sexual activity: Not on file  Other Topics  Concern   Not on file  Social History Narrative   ** Merged History Encounter **       Social Determinants of Health   Financial Resource Strain:    Difficulty of Paying Living Expenses: Not on file  Food Insecurity:    Worried About Programme researcher, broadcasting/film/video in the Last Year: Not on file   [employer] of Food in the Last Year: Not on file  Transportation Needs:    Lack of Transportation (Medical): Not on file   Lack of Transportation (Non-Medical): Not on file  Physical Activity:    Days of Exercise per Week: Not on file   Minutes of Exercise per Session: Not on file  Stress:    Feeling of Stress : Not on file  Social Connections:    Frequency of Communication with Friends and Family: Not on file   Frequency of Social Gatherings with Friends and Family: Not on file   Attends Religious Services: Not on file   Active Member of Clubs or Organizations: Not on file   Attends Banker Meetings: Not on file   Marital Status: Not on file  Intimate Partner Violence:    Fear of Current or Ex-Partner: Not on file   Emotionally Abused: Not on file   Physically Abused: Not on file   Sexually Abused: Not on file    Past Medical History, Surgical history, Social history, and Family history were reviewed and updated as appropriate.   Please see review of systems for further details on the patient's review from today.   Objective:   Physical Exam:  There were no vitals taken for this visit.  Physical Exam Constitutional:      General: She is not in acute distress. Musculoskeletal:        General: No deformity.  Neurological:     Mental Status: She is alert and oriented to person, place, and time.     Coordination: Coordination normal.  Psychiatric:        Attention and Perception: Attention and perception normal. She does not perceive auditory or visual hallucinations.        Mood and Affect: Mood normal. Mood is not anxious or depressed. Affect is not  labile, blunt, angry or inappropriate.        Speech: Speech normal.        Behavior: Behavior normal.        Thought Content: Thought content normal. Thought content is not paranoid or delusional. Thought content does not include homicidal or suicidal ideation. Thought content does not include homicidal or suicidal plan.        Cognition and Memory: Cognition and memory normal.        Judgment: Judgment normal.     Comments: Insight intact     Lab Review:     Component Value Date/Time   NA 139 03/05/2017 1055   K 4.2 03/05/2017 1055   CL 103 03/05/2017 1055  CO2 22 03/05/2017 1055   GLUCOSE 90 03/05/2017 1055   BUN 14 03/05/2017 1055   CREATININE 0.78 03/05/2017 1055   CALCIUM 9.3 03/05/2017 1055   PROT 6.9 03/05/2017 1055   ALBUMIN 4.2 03/05/2017 1055   AST 18 03/05/2017 1055   ALT 20 03/05/2017 1055   ALKPHOS 86 03/05/2017 1055   BILITOT 0.3 03/05/2017 1055   GFRNONAA 107 03/05/2017 1055   GFRAA 123 03/05/2017 1055       Component Value Date/Time   WBC 8.9 05/15/2016 1156   RBC 4.52 05/15/2016 1156   HGB 12.5 05/15/2016 1156   HCT 38.4 05/15/2016 1156   MCV 85 05/15/2016 1156   MCH 27.7 05/15/2016 1156   MCHC 32.6 05/15/2016 1156   RDW 14.1 05/15/2016 1156   LYMPHSABS 2.9 05/15/2016 1156   EOSABS 0.1 05/15/2016 1156   BASOSABS 0.1 05/15/2016 1156    No results found for: POCLITH, LITHIUM   No results found for: PHENYTOIN, PHENOBARB, VALPROATE, CBMZ   .res Assessment: Plan:    Plan:  1. Clopimramine 50mg  at hs 2. Prozac 40mg  daily 3. Adderall 10mg  as needed 4. Clonazepam 0.5mg  - 2 at hs  Sumitriptam Virbezi Birth control  3 months  Discussed potential benefits, risks, and side effects of stimulants with patient to include increased heart rate, palpitations, insomnia, increased anxiety, increased irritability, or decreased appetite.  Instructed patient to contact office if experiencing any significant tolerability issues.  Discussed potential  benefits, risk, and side effects of benzodiazepines to include potential risk of tolerance and dependence, as well as possible drowsiness.  Advised patient not to drive if experiencing drowsiness and to take lowest possible effective dose to minimize risk of dependence and tolerance.  Patient advised to contact office with any questions, adverse effects, or acute worsening in signs and symptoms.  Will review chart history.   Diagnoses and all orders for this visit:  Moderate major depression, single episode (HCC)  Attention deficit hyperactivity disorder (ADHD), combined type, moderate  Emotional lability  Physiological insomnia     Please see After Visit Summary for patient specific instructions.  Future Appointments  Date Time Provider Department Center  06/13/2020  9:00 AM Amberlyn Martinezgarcia, , NP [facility] None    No orders of the defined types were placed in this encounter.   -------------------------------

## 2020-03-14 ENCOUNTER — Encounter: Payer: Self-pay | Admitting: Psychiatry

## 2020-03-15 ENCOUNTER — Encounter: Payer: Self-pay | Admitting: Adult Health

## 2020-03-23 DIAGNOSIS — F902 Attention-deficit hyperactivity disorder, combined type: Secondary | ICD-10-CM | POA: Diagnosis not present

## 2020-03-23 DIAGNOSIS — F422 Mixed obsessional thoughts and acts: Secondary | ICD-10-CM | POA: Diagnosis not present

## 2020-03-23 DIAGNOSIS — F411 Generalized anxiety disorder: Secondary | ICD-10-CM | POA: Diagnosis not present

## 2020-03-27 ENCOUNTER — Ambulatory Visit: Payer: Self-pay | Admitting: Clinical

## 2020-03-28 DIAGNOSIS — K589 Irritable bowel syndrome without diarrhea: Secondary | ICD-10-CM | POA: Diagnosis not present

## 2020-03-28 DIAGNOSIS — E8881 Metabolic syndrome: Secondary | ICD-10-CM | POA: Diagnosis not present

## 2020-03-28 DIAGNOSIS — Z6838 Body mass index (BMI) 38.0-38.9, adult: Secondary | ICD-10-CM | POA: Diagnosis not present

## 2020-03-28 DIAGNOSIS — E282 Polycystic ovarian syndrome: Secondary | ICD-10-CM | POA: Diagnosis not present

## 2020-03-29 DIAGNOSIS — K580 Irritable bowel syndrome with diarrhea: Secondary | ICD-10-CM | POA: Diagnosis not present

## 2020-03-29 DIAGNOSIS — Z23 Encounter for immunization: Secondary | ICD-10-CM | POA: Diagnosis not present

## 2020-03-29 DIAGNOSIS — G43009 Migraine without aura, not intractable, without status migrainosus: Secondary | ICD-10-CM | POA: Diagnosis not present

## 2020-04-02 DIAGNOSIS — M7661 Achilles tendinitis, right leg: Secondary | ICD-10-CM | POA: Diagnosis not present

## 2020-04-02 DIAGNOSIS — M24571 Contracture, right ankle: Secondary | ICD-10-CM | POA: Diagnosis not present

## 2020-04-02 DIAGNOSIS — R2689 Other abnormalities of gait and mobility: Secondary | ICD-10-CM | POA: Diagnosis not present

## 2020-04-02 DIAGNOSIS — M24572 Contracture, left ankle: Secondary | ICD-10-CM | POA: Diagnosis not present

## 2020-04-03 DIAGNOSIS — M24572 Contracture, left ankle: Secondary | ICD-10-CM | POA: Diagnosis not present

## 2020-04-03 DIAGNOSIS — M24571 Contracture, right ankle: Secondary | ICD-10-CM | POA: Diagnosis not present

## 2020-04-11 DIAGNOSIS — Z6838 Body mass index (BMI) 38.0-38.9, adult: Secondary | ICD-10-CM | POA: Diagnosis not present

## 2020-04-11 DIAGNOSIS — E282 Polycystic ovarian syndrome: Secondary | ICD-10-CM | POA: Diagnosis not present

## 2020-04-11 DIAGNOSIS — E8881 Metabolic syndrome: Secondary | ICD-10-CM | POA: Diagnosis not present

## 2020-04-13 DIAGNOSIS — F411 Generalized anxiety disorder: Secondary | ICD-10-CM | POA: Diagnosis not present

## 2020-04-13 DIAGNOSIS — F902 Attention-deficit hyperactivity disorder, combined type: Secondary | ICD-10-CM | POA: Diagnosis not present

## 2020-04-13 DIAGNOSIS — F422 Mixed obsessional thoughts and acts: Secondary | ICD-10-CM | POA: Diagnosis not present

## 2020-04-17 ENCOUNTER — Ambulatory Visit: Payer: Self-pay | Admitting: Clinical

## 2020-04-24 DIAGNOSIS — E8881 Metabolic syndrome: Secondary | ICD-10-CM | POA: Diagnosis not present

## 2020-04-24 DIAGNOSIS — Z713 Dietary counseling and surveillance: Secondary | ICD-10-CM | POA: Diagnosis not present

## 2020-04-24 DIAGNOSIS — E282 Polycystic ovarian syndrome: Secondary | ICD-10-CM | POA: Diagnosis not present

## 2020-04-24 DIAGNOSIS — K589 Irritable bowel syndrome without diarrhea: Secondary | ICD-10-CM | POA: Diagnosis not present

## 2020-05-08 ENCOUNTER — Ambulatory Visit: Payer: Self-pay | Admitting: Clinical

## 2020-05-09 DIAGNOSIS — R2689 Other abnormalities of gait and mobility: Secondary | ICD-10-CM | POA: Diagnosis not present

## 2020-05-09 DIAGNOSIS — M24572 Contracture, left ankle: Secondary | ICD-10-CM | POA: Diagnosis not present

## 2020-05-09 DIAGNOSIS — M24571 Contracture, right ankle: Secondary | ICD-10-CM | POA: Diagnosis not present

## 2020-05-09 DIAGNOSIS — M722 Plantar fascial fibromatosis: Secondary | ICD-10-CM | POA: Diagnosis not present

## 2020-05-11 DIAGNOSIS — F902 Attention-deficit hyperactivity disorder, combined type: Secondary | ICD-10-CM | POA: Diagnosis not present

## 2020-05-11 DIAGNOSIS — F411 Generalized anxiety disorder: Secondary | ICD-10-CM | POA: Diagnosis not present

## 2020-05-11 DIAGNOSIS — F422 Mixed obsessional thoughts and acts: Secondary | ICD-10-CM | POA: Diagnosis not present

## 2020-05-28 DIAGNOSIS — F411 Generalized anxiety disorder: Secondary | ICD-10-CM | POA: Diagnosis not present

## 2020-05-28 DIAGNOSIS — F902 Attention-deficit hyperactivity disorder, combined type: Secondary | ICD-10-CM | POA: Diagnosis not present

## 2020-05-28 DIAGNOSIS — F422 Mixed obsessional thoughts and acts: Secondary | ICD-10-CM | POA: Diagnosis not present

## 2020-05-29 ENCOUNTER — Ambulatory Visit: Payer: Self-pay | Admitting: Clinical

## 2020-06-01 DIAGNOSIS — F902 Attention-deficit hyperactivity disorder, combined type: Secondary | ICD-10-CM | POA: Diagnosis not present

## 2020-06-01 DIAGNOSIS — F422 Mixed obsessional thoughts and acts: Secondary | ICD-10-CM | POA: Diagnosis not present

## 2020-06-01 DIAGNOSIS — F411 Generalized anxiety disorder: Secondary | ICD-10-CM | POA: Diagnosis not present

## 2020-06-02 ENCOUNTER — Ambulatory Visit: Payer: BC Managed Care – PPO | Attending: Internal Medicine

## 2020-06-02 DIAGNOSIS — Z23 Encounter for immunization: Secondary | ICD-10-CM

## 2020-06-02 NOTE — Progress Notes (Signed)
   Covid-19 Vaccination Clinic  Name:  Andreanna Mikolajczak    MRN: [id number] DOB: June 19, 1990  06/02/2020  Ms. Trenkamp was observed post Covid-19 immunization for 15 minutes without incident. She was provided with Vaccine Information Sheet and instruction to access the V-Safe system.   Ms. Surprenant was instructed to call 911 with any severe reactions post vaccine: Marland Kitchen Difficulty breathing  . Swelling of face and throat  . A fast heartbeat  . A bad rash all over body  . Dizziness and weakness   Immunizations Administered    Name Date Dose VIS Date Route   Pfizer COVID-19 Vaccine 06/02/2020 11:14 AM 0.3 mL 03/14/2020 Intramuscular   Manufacturer: [employer], [employer]   Lot: [id number]   NDC: [address]-5

## 2020-06-13 ENCOUNTER — Encounter: Payer: Self-pay | Admitting: Adult Health

## 2020-06-13 ENCOUNTER — Telehealth (INDEPENDENT_AMBULATORY_CARE_PROVIDER_SITE_OTHER): Payer: BC Managed Care – PPO | Admitting: Adult Health

## 2020-06-13 DIAGNOSIS — F410 Panic disorder [episodic paroxysmal anxiety] without agoraphobia: Secondary | ICD-10-CM | POA: Diagnosis not present

## 2020-06-13 DIAGNOSIS — F411 Generalized anxiety disorder: Secondary | ICD-10-CM

## 2020-06-13 DIAGNOSIS — F902 Attention-deficit hyperactivity disorder, combined type: Secondary | ICD-10-CM

## 2020-06-13 DIAGNOSIS — F322 Major depressive disorder, single episode, severe without psychotic features: Secondary | ICD-10-CM

## 2020-06-13 DIAGNOSIS — F5081 Binge eating disorder: Secondary | ICD-10-CM

## 2020-06-13 MED ORDER — CLONAZEPAM 0.5 MG PO TABS: ORAL_TABLET | ORAL | 0 refills | Status: DC

## 2020-06-13 MED ORDER — DEXTROAMPHETAMINE SULFATE 10 MG PO TABS: 10.0000 mg | ORAL_TABLET | Freq: Every day | ORAL | 0 refills | Status: DC

## 2020-06-13 MED ORDER — CLOMIPRAMINE HCL 50 MG PO CAPS: ORAL_CAPSULE | ORAL | 5 refills | Status: DC

## 2020-06-13 MED ORDER — FLUOXETINE HCL 40 MG PO CAPS: 40.0000 mg | ORAL_CAPSULE | Freq: Every day | ORAL | 5 refills | Status: DC

## 2020-06-13 NOTE — Progress Notes (Signed)
Laura Perez [id number] [birth year] 30 y.o.  Virtual Visit via Telephone Note  I connected with pt on 06/13/20 at  9:00 AM EST by telephone and verified that I am speaking with the correct person using two identifiers.   I discussed the limitations, risks, security and privacy concerns of performing an evaluation and management service by telephone and the availability of in person appointments. I also discussed with the patient that there may be a patient responsible charge related to this service. The patient expressed understanding and agreed to proceed.   I discussed the assessment and treatment plan with the patient. The patient was provided an opportunity to ask questions and all were answered. The patient agreed with the plan and demonstrated an understanding of the instructions.   The patient was advised to call back or seek an in-person evaluation if the symptoms worsen or if the condition fails to improve as anticipated.  I provided 30 minutes of non-face-to-face time during this encounter.  The patient was located at home.  The provider was located at [facility].   Laura Gibbs, NP   Subjective:   Patient ID:  Laura Perez is a 30 y.o. (DOB 1991-03-16) female.  Chief Complaint: No chief complaint on file.   HPI Laura Perez presents for follow-up of ADHD, MDD, emotional lability, and physiological insomnia.  Describes mood today as "ok". Pleasant. Mood symptoms - denies depression, anxiety, and irritability. Stating "I'm doing ok".  Had to put her dog of 12 years down recently. Stating "It's been very difficult". Recently went to an [employer]. Working part time. Helping family. Stable interest and motivation. Taking medications as prescribed.  Energy levels stable. Active, does not have a regular exercise routine.  Enjoys some usual interests and activities. Single. Has a boyfriend. Lives with parents. Spending time with family and her best  friends.  Appetite adequate. Weight loss with Ozempic. Working with dietician. Sleeps well most nights "not lately". Averages 8 hours. Napping during the day. Focus and concentration "ok". Completing tasks. Managing aspects of household. Works part-time - 5 to 10 hours - Tuesday morning.  Denies SI or HI.  Denies AH or VH. Dr Laura Perez - therapist  Previous medication trials: Zoloft, lamictal, Celexa, Elavil  Has not tried - Prozac, Effexor, lexapro, Clomiprimine, Abilify, Rexulti, Seroquel, Latuda, Buspar   Review of Systems:  Review of Systems  Musculoskeletal: Negative for gait problem.  Neurological: Negative for tremors.  Psychiatric/Behavioral:       Please refer to HPI    Medications: I have reviewed the patient's current medications.  Current Outpatient Medications  Medication Sig Dispense Refill  . beclomethasone (QVAR) 80 MCG/ACT inhaler Inhale 1 puff into the lungs as needed.    . clomiPRAMINE (ANAFRANIL) 50 MG capsule 1 capsule at bedtime 30 capsule 5  . clonazePAM (KLONOPIN) 0.5 MG tablet TAKE 1 TABLET (0.5 MG TOTAL) BY MOUTH 2 (TWO) TIMES DAILY AS NEEDED FOR ANXIETY. 60 tablet 0  . dextroamphetamine (DEXTROSTAT) 10 MG tablet Take 1 tablet (10 mg total) by mouth daily. 30 tablet 0  . diclofenac (VOLTAREN) 75 MG EC tablet Take 1 tablet (75 mg total) by mouth 2 (two) times daily. 60 tablet 1  . FLUoxetine (PROZAC) 40 MG capsule Take 1 capsule (40 mg total) by mouth at bedtime. 30 capsule 5  . nortriptyline (PAMELOR) 10 MG capsule Take 1 capsule (10 mg total) by mouth at bedtime. 30 capsule 2  . nortriptyline (PAMELOR) 25 MG capsule Take 1 capsule (25  mg total) by mouth at bedtime. 30 capsule 2  . ondansetron (ZOFRAN ODT) 4 MG disintegrating tablet Take one tab by mouth Q6hr prn nausea (Dissolve under tongue) 12 tablet 0   No current facility-administered medications for this visit.    Medication Side Effects: None  Allergies: No Known Allergies  Past Medical History:   Diagnosis Date  . ADHD   . Anxiety   . Asthma   . Depression   . GERD (gastroesophageal reflux disease)   . IBS (irritable bowel syndrome)   . PCOS (polycystic ovarian syndrome)     Family History  Adopted: Yes    Social History   Socioeconomic History  . Marital status: Single    Spouse name: Not on file  . Number of children: Not on file  . Years of education: Not on file  . Highest education level: Not on file  Occupational History  . Occupation: Pensions consultant: [employer]  Tobacco Use  . Smoking status: Never Smoker  . Smokeless tobacco: Never Used  Vaping Use  . Vaping Use: Never used  Substance and Sexual Activity  . Alcohol use: No    Alcohol/week: 0.0 standard drinks  . Drug use: Never  . Sexual activity: Not on file  Other Topics Concern  . Not on file  Social History Narrative   ** Merged History Encounter **       Social Determinants of Health   Financial Resource Strain: Not on file  Food Insecurity: Not on file  Transportation Needs: Not on file  Physical Activity: Not on file  Stress: Not on file  Social Connections: Not on file  Intimate Partner Violence: Not on file    Past Medical History, Surgical history, Social history, and Family history were reviewed and updated as appropriate.   Please see review of systems for further details on the patient's review from today.   Objective:   Physical Exam:  There were no vitals taken for this visit.  Physical Exam Neurological:     Mental Status: She is alert and oriented to person, place, and time.     Cranial Nerves: No dysarthria.  Psychiatric:        Attention and Perception: Attention and perception normal.        Mood and Affect: Mood normal.        Speech: Speech normal.        Behavior: Behavior is cooperative.        Thought Content: Thought content normal. Thought content is not paranoid or delusional. Thought content does not include homicidal or suicidal ideation.  Thought content does not include homicidal or suicidal plan.        Cognition and Memory: Cognition and memory normal.        Judgment: Judgment normal.     Comments: Insight intact     Lab Review:     Component Value Date/Time   NA 139 03/05/2017 1055   K 4.2 03/05/2017 1055   CL 103 03/05/2017 1055   CO2 22 03/05/2017 1055   GLUCOSE 90 03/05/2017 1055   BUN 14 03/05/2017 1055   CREATININE 0.78 03/05/2017 1055   CALCIUM 9.3 03/05/2017 1055   PROT 6.9 03/05/2017 1055   ALBUMIN 4.2 03/05/2017 1055   AST 18 03/05/2017 1055   ALT 20 03/05/2017 1055   ALKPHOS 86 03/05/2017 1055   BILITOT 0.3 03/05/2017 1055   GFRNONAA 107 03/05/2017 1055   GFRAA 123 03/05/2017 1055  Component Value Date/Time   WBC 8.9 05/15/2016 1156   RBC 4.52 05/15/2016 1156   HGB 12.5 05/15/2016 1156   HCT 38.4 05/15/2016 1156   MCV 85 05/15/2016 1156   MCH 27.7 05/15/2016 1156   MCHC 32.6 05/15/2016 1156   RDW 14.1 05/15/2016 1156   LYMPHSABS 2.9 05/15/2016 1156   EOSABS 0.1 05/15/2016 1156   BASOSABS 0.1 05/15/2016 1156    No results found for: POCLITH, LITHIUM   No results found for: PHENYTOIN, PHENOBARB, VALPROATE, CBMZ   .res Assessment: Plan:    Plan:  1. Clopimramine 50mg  at hs 2. Prozac 40mg  daily 3. Adderall 10mg  as needed 4. Clonazepam 0.5mg  - 2 at hs  Sumitriptam Virbezi Birth control  3 months  Discussed potential benefits, risks, and side effects of stimulants with patient to include increased heart rate, palpitations, insomnia, increased anxiety, increased irritability, or decreased appetite.  Instructed patient to contact office if experiencing any significant tolerability issues.  Discussed potential benefits, risk, and side effects of benzodiazepines to include potential risk of tolerance and dependence, as well as possible drowsiness.  Advised patient not to drive if experiencing drowsiness and to take lowest possible effective dose to minimize risk of dependence  and tolerance.  Patient advised to contact office with any questions, adverse effects, or acute worsening in signs and symptoms.    Diagnoses and all orders for this visit:  Severe major depression, single episode, without psychotic features (HCC) -     FLUoxetine (PROZAC) 40 MG capsule; Take 1 capsule (40 mg total) by mouth at bedtime.  Panic disorder -     clonazePAM (KLONOPIN) 0.5 MG tablet; TAKE 1 TABLET (0.5 MG TOTAL) BY MOUTH 2 (TWO) TIMES DAILY AS NEEDED FOR ANXIETY. -     clomiPRAMINE (ANAFRANIL) 50 MG capsule; 1 capsule at bedtime  Generalized anxiety disorder -     clonazePAM (KLONOPIN) 0.5 MG tablet; TAKE 1 TABLET (0.5 MG TOTAL) BY MOUTH 2 (TWO) TIMES DAILY AS NEEDED FOR ANXIETY. -     clomiPRAMINE (ANAFRANIL) 50 MG capsule; 1 capsule at bedtime  Attention deficit hyperactivity disorder (ADHD), combined type, moderate -     dextroamphetamine (DEXTROSTAT) 10 MG tablet; Take 1 tablet (10 mg total) by mouth daily.  Binge eating disorder -     dextroamphetamine (DEXTROSTAT) 10 MG tablet; Take 1 tablet (10 mg total) by mouth daily. -     clonazePAM (KLONOPIN) 0.5 MG tablet; TAKE 1 TABLET (0.5 MG TOTAL) BY MOUTH 2 (TWO) TIMES DAILY AS NEEDED FOR ANXIETY.    Please see After Visit Summary for patient specific instructions.  No future appointments.  No orders of the defined types were placed in this encounter.     -------------------------------

## 2020-06-17 ENCOUNTER — Other Ambulatory Visit: Payer: Self-pay

## 2020-06-17 ENCOUNTER — Encounter (HOSPITAL_COMMUNITY): Payer: Self-pay | Admitting: Emergency Medicine

## 2020-06-17 ENCOUNTER — Emergency Department (HOSPITAL_COMMUNITY)
Admission: EM | Admit: 2020-06-17 | Discharge: 2020-06-18 | Disposition: A | Discharge status: Home / Self Care | Payer: BC Managed Care – PPO | Attending: Emergency Medicine | Admitting: Emergency Medicine

## 2020-06-17 DIAGNOSIS — K529 Noninfective gastroenteritis and colitis, unspecified: Secondary | ICD-10-CM

## 2020-06-17 DIAGNOSIS — K580 Irritable bowel syndrome with diarrhea: Secondary | ICD-10-CM | POA: Insufficient documentation

## 2020-06-17 DIAGNOSIS — E860 Dehydration: Secondary | ICD-10-CM

## 2020-06-17 DIAGNOSIS — R197 Diarrhea, unspecified: Secondary | ICD-10-CM | POA: Insufficient documentation

## 2020-06-17 DIAGNOSIS — Z20822 Contact with and (suspected) exposure to covid-19: Secondary | ICD-10-CM | POA: Insufficient documentation

## 2020-06-17 DIAGNOSIS — R1110 Vomiting, unspecified: Secondary | ICD-10-CM | POA: Diagnosis not present

## 2020-06-17 DIAGNOSIS — K589 Irritable bowel syndrome without diarrhea: Secondary | ICD-10-CM | POA: Diagnosis not present

## 2020-06-17 DIAGNOSIS — J45909 Unspecified asthma, uncomplicated: Secondary | ICD-10-CM | POA: Insufficient documentation

## 2020-06-17 LAB — COMPREHENSIVE METABOLIC PANEL
ALT: 47 U/L — ABNORMAL HIGH (ref 0–44)
AST: 38 U/L (ref 15–41)
Albumin: 4.5 g/dL (ref 3.5–5.0)
Alkaline Phosphatase: 86 U/L (ref 38–126)
Anion gap: 16 — ABNORMAL HIGH (ref 5–15)
BUN: 16 mg/dL (ref 6–20)
CO2: 16 mmol/L — ABNORMAL LOW (ref 22–32)
Calcium: 10.5 mg/dL — ABNORMAL HIGH (ref 8.9–10.3)
Chloride: 108 mmol/L (ref 98–111)
Creatinine, Ser: 1.29 mg/dL — ABNORMAL HIGH (ref 0.44–1.00)
GFR, Estimated: 58 mL/min — ABNORMAL LOW (ref >60)
Glucose, Bld: 111 mg/dL — ABNORMAL HIGH (ref 70–99)
Potassium: 5.2 mmol/L — ABNORMAL HIGH (ref 3.5–5.1)
Sodium: 140 mmol/L (ref 135–145)
Total Bilirubin: 0.5 mg/dL (ref 0.3–1.2)
Total Protein: 8.6 g/dL — ABNORMAL HIGH (ref 6.5–8.1)

## 2020-06-17 LAB — URINALYSIS, ROUTINE W REFLEX MICROSCOPIC
Bilirubin Urine: NEGATIVE
Glucose, UA: NEGATIVE mg/dL
Hgb urine dipstick: NEGATIVE
Ketones, ur: 5 mg/dL — AB
Nitrite: NEGATIVE
Protein, ur: 30 mg/dL — AB
Specific Gravity, Urine: 1.031 — ABNORMAL HIGH (ref 1.005–1.030)
pH: 5 (ref 5.0–8.0)

## 2020-06-17 LAB — CBC
HCT: 53.7 % — ABNORMAL HIGH (ref 36.0–46.0)
Hemoglobin: 15.8 g/dL — ABNORMAL HIGH (ref 12.0–15.0)
MCH: 28.5 pg (ref 26.0–34.0)
MCHC: 29.4 g/dL — ABNORMAL LOW (ref 30.0–36.0)
MCV: 96.9 fL (ref 80.0–100.0)
Platelets: 231 10*3/uL (ref 150–400)
RBC: 5.54 MIL/uL — ABNORMAL HIGH (ref 3.87–5.11)
RDW: 13.6 % (ref 11.5–15.5)
WBC: 17.3 10*3/uL — ABNORMAL HIGH (ref 4.0–10.5)
nRBC: 0 % (ref 0.0–0.2)

## 2020-06-17 LAB — LIPASE, BLOOD: Lipase: 22 U/L (ref 11–51)

## 2020-06-17 LAB — I-STAT BETA HCG BLOOD, ED (MC, WL, AP ONLY): I-stat hCG, quantitative: <5 m[IU]/mL (ref <5)

## 2020-06-17 MED ORDER — PANTOPRAZOLE SODIUM 20 MG PO TBEC: 20.0000 mg | DELAYED_RELEASE_TABLET | Freq: Every day | ORAL | 0 refills | Status: AC

## 2020-06-17 MED ORDER — ONDANSETRON HCL 4 MG/2ML IJ SOLN
4.0000 mg | Freq: Once | INTRAMUSCULAR | Status: AC
  Administered 2020-06-17: 4 mg via INTRAVENOUS
  Filled 2020-06-17: qty 2

## 2020-06-17 MED ORDER — SODIUM CHLORIDE 0.9 % IV BOLUS
1000.0000 mL | Freq: Once | INTRAVENOUS | Status: AC
  Administered 2020-06-17: 1000 mL via INTRAVENOUS

## 2020-06-17 MED ORDER — PANTOPRAZOLE SODIUM 40 MG IV SOLR
40.0000 mg | Freq: Once | INTRAVENOUS | Status: AC
  Administered 2020-06-17: 40 mg via INTRAVENOUS
  Filled 2020-06-17: qty 40

## 2020-06-17 NOTE — ED Triage Notes (Signed)
Patient reports diarrhea x3 days and emesis since earlier today. Patient reports vomiting several times today.Reports overall feeling of weakness. Hx IBS. Denies any sick exposures that she is aware of. Denies fevers. Vaccinated against COVID 19.

## 2020-06-17 NOTE — ED Provider Notes (Signed)
[facility]-EMERGENCY DEPT Provider Note   CSN: [id number] Arrival date & time: 06/17/20  1537     History Chief Complaint  Patient presents with  . Emesis  . Diarrhea    Laura Perez is a 30 y.o. female.  Patient complains of vomiting and diarrhea.  Vomiting for 3 days diarrhea for 1 day.  No fevers no chills.  Patient has a history of irritable bowel  The history is provided by the patient and medical records. No language interpreter was used.  Emesis Severity:  Moderate Timing:  Constant Quality:  Bilious material Able to tolerate:  Liquids Progression:  Improving Chronicity:  New Recent urination:  Normal Relieved by:  Nothing Worsened by:  Nothing Associated symptoms: diarrhea   Associated symptoms: no abdominal pain, no cough and no headaches   Diarrhea Associated symptoms: vomiting   Associated symptoms: no abdominal pain and no headaches        Past Medical History:  Diagnosis Date  . ADHD   . Anxiety   . Asthma   . Depression   . GERD (gastroesophageal reflux disease)   . IBS (irritable bowel syndrome)   . PCOS (polycystic ovarian syndrome)     Patient Active Problem List   Diagnosis Date Noted  . Severe major depression, single episode, without psychotic features (HCC) 07/27/2018  . Panic disorder 06/08/2018  . Generalized anxiety disorder 06/08/2018  . Binge eating disorder 06/08/2018  . Class 1 obesity with serious comorbidity and body mass index (BMI) of 33.0 to 33.9 in adult 06/18/2017  . Insulin resistance 02/02/2017  . Class 1 obesity with serious comorbidity and body mass index (BMI) of 34.0 to 34.9 in adult 02/02/2017  . Class 2 obesity without serious comorbidity with body mass index (BMI) of 35.0 to 35.9 in adult 01/19/2017  . Hyperlipidemia 10/22/2016  . Vitamin D deficiency 10/06/2016  . PCOS (polycystic ovarian syndrome) 10/06/2016  . Class 2 obesity with serious comorbidity and body mass index (BMI) of 35.0  to 35.9 in adult 10/06/2016  . Attention deficit hyperactivity disorder (ADHD), combined type, moderate 05/13/2016  . Allergic rhinitis 05/13/2016  . Absence of menstruation 05/11/2015  . Left knee pain 10/27/2014    History reviewed. No pertinent surgical history.   OB History    Gravida  0   Para  0   Term  0   Preterm  0   AB  0   Living        SAB  0   IAB  0   Ectopic  0   Multiple      Live Births              Family History  Adopted: Yes    Social History   Tobacco Use  . Smoking status: Never Smoker  . Smokeless tobacco: Never Used  Vaping Use  . Vaping Use: Never used  Substance Use Topics  . Alcohol use: No    Alcohol/week: 0.0 standard drinks  . Drug use: Never    Home Medications Prior to Admission medications   Medication Sig Start Date End Date Taking? Authorizing Provider  beclomethasone (QVAR) 80 MCG/ACT inhaler Inhale 1 puff into the lungs daily as needed (wheezing). 05/11/15  Yes [provider]  clomiPRAMINE (ANAFRANIL) 50 MG capsule 1 capsule at bedtime Patient taking differently: Take 50 mg by mouth at bedtime. 06/13/20  Yes Mozingo, Thereasa Solo, NP  clonazePAM (KLONOPIN) 0.5 MG tablet TAKE 1 TABLET (0.5 MG TOTAL)  BY MOUTH 2 (TWO) TIMES DAILY AS NEEDED FOR ANXIETY. Patient taking differently: Take 0.5 mg by mouth 2 (two) times daily as needed for anxiety. 06/13/20  Yes Mozingo, Thereasa Solo, NP  dextroamphetamine (DEXTROSTAT) 10 MG tablet Take 1 tablet (10 mg total) by mouth daily. Patient taking differently: Take 10 mg by mouth daily as needed (ADHD). 06/13/20  Yes Mozingo, Thereasa Solo, NP  FLUoxetine (PROZAC) 40 MG capsule Take 1 capsule (40 mg total) by mouth at bedtime. Patient taking differently: Take 40 mg by mouth daily. 06/13/20  Yes Mozingo, Thereasa Solo, NP  Ibuprofen-diphenhydrAMINE Cit (ADVIL PM PO) Take 2 tablets by mouth daily as needed (pain).   Yes [provider]  NIKKI 3-0.02 MG  tablet Take 1 tablet by mouth daily. 06/02/20  Yes [provider]  nortriptyline (PAMELOR) 25 MG capsule Take 1 capsule (25 mg total) by mouth at bedtime. 06/08/18  Yes Chauncey Mann, MD  ondansetron (ZOFRAN ODT) 4 MG disintegrating tablet Take one tab by mouth Q6hr prn nausea (Dissolve under tongue) Patient taking differently: Take 4 mg by mouth every 8 (eight) hours as needed for nausea or vomiting. 04/09/16  Yes Lattie Haw, MD  OZEMPIC, 0.25 OR 0.5 MG/DOSE, 2 MG/1.5ML SOPN Inject 0.5 mg into the skin once a week. Friday 05/30/20  Yes [provider]  VIBERZI 100 MG TABS Take 1 tablet by mouth 2 (two) times daily. 04/04/20  Yes [provider]  diclofenac (VOLTAREN) 75 MG EC tablet Take 1 tablet (75 mg total) by mouth 2 (two) times daily. Patient not taking: No sig reported 05/21/16   Carrington Clamp, DPM  nortriptyline (PAMELOR) 10 MG capsule Take 1 capsule (10 mg total) by mouth at bedtime. Patient not taking: No sig reported 06/08/18   Chauncey Mann, MD    Allergies    Patient has no known allergies.  Review of Systems   Review of Systems  Constitutional: Negative for appetite change and fatigue.  HENT: Negative for congestion, ear discharge and sinus pressure.   Eyes: Negative for discharge.  Respiratory: Negative for cough.   Cardiovascular: Negative for chest pain.  Gastrointestinal: Positive for diarrhea and vomiting. Negative for abdominal pain.  Genitourinary: Negative for frequency and hematuria.  Musculoskeletal: Negative for back pain.  Skin: Negative for rash.  Neurological: Negative for seizures and headaches.  Psychiatric/Behavioral: Negative for hallucinations.    Physical Exam Updated Vital Signs BP (!) 145/93 (BP Location: Left Arm)   Pulse 98   Temp 97.7 F (36.5 C) (Oral)   Resp 16   Ht 5' 6.5" (1.689 m)   Wt 103.9 kg   SpO2 98%   BMI 36.41 kg/m   Physical Exam Vitals and nursing note reviewed.  Constitutional:       Appearance: She is well-developed.  HENT:     Head: Normocephalic.     Mouth/Throat:     Mouth: Mucous membranes are moist.  Eyes:     General: No scleral icterus.    Extraocular Movements: EOM normal.     Conjunctiva/sclera: Conjunctivae normal.  Neck:     Thyroid: No thyromegaly.  Cardiovascular:     Rate and Rhythm: Normal rate and regular rhythm.     Heart sounds: No murmur heard. No friction rub. No gallop.   Pulmonary:     Breath sounds: No stridor. No wheezing or rales.  Chest:     Chest wall: No tenderness.  Abdominal:     General: There is no  distension.     Tenderness: There is no abdominal tenderness. There is no rebound.  Musculoskeletal:        General: No edema. Normal range of motion.     Cervical back: Neck supple.  Lymphadenopathy:     Cervical: No cervical adenopathy.  Skin:    Findings: No erythema or rash.  Neurological:     Mental Status: She is alert and oriented to person, place, and time.     Motor: No abnormal muscle tone.     Coordination: Coordination normal.  Psychiatric:        Mood and Affect: Mood and affect normal.        Behavior: Behavior normal.     ED Results / Procedures / Treatments   Labs (all labs ordered are listed, but only abnormal results are displayed) Labs Reviewed  COMPREHENSIVE METABOLIC PANEL - Abnormal; Notable for the following components:      Result Value   Potassium 5.2 (*)    CO2 16 (*)    Glucose, Bld 111 (*)    Creatinine, Ser 1.29 (*)    Calcium 10.5 (*)    Total Protein 8.6 (*)    ALT 47 (*)    GFR, Estimated 58 (*)    Anion gap 16 (*)    All other components within normal limits  CBC - Abnormal; Notable for the following components:   WBC 17.3 (*)    RBC 5.54 (*)    Hemoglobin 15.8 (*)    HCT 53.7 (*)    MCHC 29.4 (*)    All other components within normal limits  URINALYSIS, ROUTINE W REFLEX MICROSCOPIC - Abnormal; Notable for the following components:   APPearance CLOUDY (*)    Specific  Gravity, Urine 1.031 (*)    Ketones, ur 5 (*)    Protein, ur 30 (*)    Leukocytes,Ua TRACE (*)    Bacteria, UA RARE (*)    All other components within normal limits  SARS CORONAVIRUS 2 (TAT 6-24 HRS)  LIPASE, BLOOD  BASIC METABOLIC PANEL  I-STAT BETA HCG BLOOD, ED ([facility], [facility], [facility] ONLY)    EKG None  Radiology No results found.  Procedures Procedures (including critical care time)  Medications Ordered in ED Medications  sodium chloride 0.9 % bolus 1,000 mL (1,000 mLs Intravenous New Bag/Given 06/17/20 2144)  ondansetron (ZOFRAN) injection 4 mg (4 mg Intravenous Given 06/17/20 2145)  pantoprazole (PROTONIX) injection 40 mg (40 mg Intravenous Given 06/17/20 2145)    ED Course  I have reviewed the triage vital signs and the nursing notes.  Pertinent labs & imaging results that were available during my care of the patient were reviewed by me and considered in my medical decision making (see chart for details).    MDM Rules/Calculators/A&P                          Patient with vomiting and diarrhea which has improved with fluids protonic and Zofran.  Patient maybe admitted pending labs because patient was a little acidotic Final Clinical Impression(s) / ED Diagnoses Final diagnoses:  None    Rx / DC Orders ED Discharge Orders    None       Bethann Berkshire, MD 06/21/20 1040

## 2020-06-17 NOTE — Discharge Instructions (Addendum)
Follow-up with your family doctor this week for recheck.  Clear liquid diet for the next 12 hours, then slowly advance to crackers, toast, and bland diet as tolerated.  Return to the emergency department if you develop severe abdominal pain, high fevers, bloody stools, or other new and concerning symptoms.

## 2020-06-18 LAB — BASIC METABOLIC PANEL
Anion gap: 11 (ref 5–15)
BUN: 19 mg/dL (ref 6–20)
CO2: 18 mmol/L — ABNORMAL LOW (ref 22–32)
Calcium: 8.9 mg/dL (ref 8.9–10.3)
Chloride: 109 mmol/L (ref 98–111)
Creatinine, Ser: 0.93 mg/dL (ref 0.44–1.00)
GFR, Estimated: >60 mL/min (ref >60)
Glucose, Bld: 84 mg/dL (ref 70–99)
Potassium: 4.6 mmol/L (ref 3.5–5.1)
Sodium: 138 mmol/L (ref 135–145)

## 2020-06-18 LAB — SARS CORONAVIRUS 2 (TAT 6-24 HRS): SARS Coronavirus 2: NEGATIVE

## 2020-06-18 NOTE — ED Provider Notes (Signed)
Care assumed from Dr. Estell Harpin at shift change.  Patient with history of irritable bowel presenting here with vomiting and diarrhea for the past 2 days.  Her electrolytes reveal a CO2 of 16 and a mild bump of her creatinine.  IV fluids have been administered and care was signed out to me awaiting results of repeat metabolic panel after receiving the fluids.  CO2 has increased from 16-18 and creatinine has decreased.  Patient is feeling much better.  She has tolerated 3 cupfuls of water and feels as though she is able to go home.  Patient's abdomen reexamined.  There is no focal tenderness or concerning findings.  Patient to be discharged with as needed return.   Geoffery Lyons, MD 06/18/20 0111

## 2020-06-19 ENCOUNTER — Ambulatory Visit: Payer: Self-pay | Admitting: Clinical

## 2020-07-10 ENCOUNTER — Ambulatory Visit: Payer: Self-pay | Admitting: Clinical

## 2020-07-20 DIAGNOSIS — F422 Mixed obsessional thoughts and acts: Secondary | ICD-10-CM | POA: Diagnosis not present

## 2020-07-20 DIAGNOSIS — F411 Generalized anxiety disorder: Secondary | ICD-10-CM | POA: Diagnosis not present

## 2020-07-20 DIAGNOSIS — F902 Attention-deficit hyperactivity disorder, combined type: Secondary | ICD-10-CM | POA: Diagnosis not present

## 2020-07-30 ENCOUNTER — Other Ambulatory Visit: Payer: Self-pay | Admitting: Adult Health

## 2020-07-30 DIAGNOSIS — F5081 Binge eating disorder: Secondary | ICD-10-CM

## 2020-07-30 DIAGNOSIS — F410 Panic disorder [episodic paroxysmal anxiety] without agoraphobia: Secondary | ICD-10-CM

## 2020-07-30 DIAGNOSIS — F50819 Binge eating disorder, unspecified: Secondary | ICD-10-CM

## 2020-07-30 DIAGNOSIS — F411 Generalized anxiety disorder: Secondary | ICD-10-CM

## 2020-07-30 NOTE — Telephone Encounter (Signed)
Last apt 01/19, canceled 2 apts since then

## 2020-07-31 ENCOUNTER — Ambulatory Visit: Payer: Self-pay | Admitting: Clinical

## 2020-07-31 NOTE — Telephone Encounter (Signed)
Script sent  

## 2020-08-06 DIAGNOSIS — Z713 Dietary counseling and surveillance: Secondary | ICD-10-CM | POA: Diagnosis not present

## 2020-08-06 DIAGNOSIS — E282 Polycystic ovarian syndrome: Secondary | ICD-10-CM | POA: Diagnosis not present

## 2020-08-06 DIAGNOSIS — E8881 Metabolic syndrome: Secondary | ICD-10-CM | POA: Diagnosis not present

## 2020-08-06 DIAGNOSIS — K589 Irritable bowel syndrome without diarrhea: Secondary | ICD-10-CM | POA: Diagnosis not present

## 2020-08-25 DIAGNOSIS — F411 Generalized anxiety disorder: Secondary | ICD-10-CM | POA: Diagnosis not present

## 2020-08-25 DIAGNOSIS — F902 Attention-deficit hyperactivity disorder, combined type: Secondary | ICD-10-CM | POA: Diagnosis not present

## 2020-08-25 DIAGNOSIS — F422 Mixed obsessional thoughts and acts: Secondary | ICD-10-CM | POA: Diagnosis not present

## 2020-08-26 ENCOUNTER — Other Ambulatory Visit: Payer: Self-pay | Admitting: Adult Health

## 2020-08-26 DIAGNOSIS — F5081 Binge eating disorder: Secondary | ICD-10-CM

## 2020-08-26 DIAGNOSIS — F411 Generalized anxiety disorder: Secondary | ICD-10-CM

## 2020-08-26 DIAGNOSIS — F410 Panic disorder [episodic paroxysmal anxiety] without agoraphobia: Secondary | ICD-10-CM

## 2020-08-27 NOTE — Telephone Encounter (Signed)
Controlled substance 

## 2020-09-03 DIAGNOSIS — M25571 Pain in right ankle and joints of right foot: Secondary | ICD-10-CM | POA: Diagnosis not present

## 2020-09-08 DIAGNOSIS — F902 Attention-deficit hyperactivity disorder, combined type: Secondary | ICD-10-CM | POA: Diagnosis not present

## 2020-09-08 DIAGNOSIS — F411 Generalized anxiety disorder: Secondary | ICD-10-CM | POA: Diagnosis not present

## 2020-09-08 DIAGNOSIS — F422 Mixed obsessional thoughts and acts: Secondary | ICD-10-CM | POA: Diagnosis not present

## 2020-09-15 DIAGNOSIS — F411 Generalized anxiety disorder: Secondary | ICD-10-CM | POA: Diagnosis not present

## 2020-09-15 DIAGNOSIS — F422 Mixed obsessional thoughts and acts: Secondary | ICD-10-CM | POA: Diagnosis not present

## 2020-09-15 DIAGNOSIS — F902 Attention-deficit hyperactivity disorder, combined type: Secondary | ICD-10-CM | POA: Diagnosis not present

## 2020-09-30 ENCOUNTER — Other Ambulatory Visit: Payer: Self-pay

## 2020-09-30 ENCOUNTER — Emergency Department (HOSPITAL_COMMUNITY)
Admission: EM | Admit: 2020-09-30 | Discharge: 2020-09-30 | Disposition: A | Discharge status: Home / Self Care | Payer: BC Managed Care – PPO | Attending: Emergency Medicine | Admitting: Emergency Medicine

## 2020-09-30 ENCOUNTER — Encounter (HOSPITAL_COMMUNITY): Payer: Self-pay | Admitting: Emergency Medicine

## 2020-09-30 DIAGNOSIS — R531 Weakness: Secondary | ICD-10-CM | POA: Diagnosis not present

## 2020-09-30 DIAGNOSIS — R457 State of emotional shock and stress, unspecified: Secondary | ICD-10-CM | POA: Diagnosis not present

## 2020-09-30 DIAGNOSIS — J45909 Unspecified asthma, uncomplicated: Secondary | ICD-10-CM | POA: Insufficient documentation

## 2020-09-30 DIAGNOSIS — F419 Anxiety disorder, unspecified: Secondary | ICD-10-CM | POA: Diagnosis not present

## 2020-09-30 DIAGNOSIS — Y92410 Unspecified street and highway as the place of occurrence of the external cause: Secondary | ICD-10-CM | POA: Insufficient documentation

## 2020-09-30 DIAGNOSIS — R42 Dizziness and giddiness: Secondary | ICD-10-CM | POA: Diagnosis not present

## 2020-09-30 MED ORDER — LORAZEPAM 1 MG PO TABS
1.0000 mg | ORAL_TABLET | Freq: Once | ORAL | Status: AC
  Administered 2020-09-30: 1 mg via ORAL
  Filled 2020-09-30: qty 1

## 2020-09-30 NOTE — Discharge Instructions (Signed)
Please follow up with your PCP regarding your ED visit today You may be sore tomorrow. You can take Ibuprofen and Tylenol as needed for pain.  Return to the ED for any new/worsening symptoms
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
## 2020-10-03 DIAGNOSIS — F411 Generalized anxiety disorder: Secondary | ICD-10-CM | POA: Diagnosis not present

## 2020-10-03 DIAGNOSIS — F902 Attention-deficit hyperactivity disorder, combined type: Secondary | ICD-10-CM | POA: Diagnosis not present

## 2020-10-03 DIAGNOSIS — F422 Mixed obsessional thoughts and acts: Secondary | ICD-10-CM | POA: Diagnosis not present

## 2020-10-07 DIAGNOSIS — F902 Attention-deficit hyperactivity disorder, combined type: Secondary | ICD-10-CM | POA: Diagnosis not present

## 2020-10-07 DIAGNOSIS — F422 Mixed obsessional thoughts and acts: Secondary | ICD-10-CM | POA: Diagnosis not present

## 2020-10-07 DIAGNOSIS — F411 Generalized anxiety disorder: Secondary | ICD-10-CM | POA: Diagnosis not present

## 2020-10-19 DIAGNOSIS — S93401A Sprain of unspecified ligament of right ankle, initial encounter: Secondary | ICD-10-CM | POA: Diagnosis not present

## 2020-10-19 DIAGNOSIS — K580 Irritable bowel syndrome with diarrhea: Secondary | ICD-10-CM | POA: Diagnosis not present

## 2020-10-19 DIAGNOSIS — S93402A Sprain of unspecified ligament of left ankle, initial encounter: Secondary | ICD-10-CM | POA: Diagnosis not present

## 2020-10-27 DIAGNOSIS — F411 Generalized anxiety disorder: Secondary | ICD-10-CM | POA: Diagnosis not present

## 2020-10-27 DIAGNOSIS — F422 Mixed obsessional thoughts and acts: Secondary | ICD-10-CM | POA: Diagnosis not present

## 2020-10-27 DIAGNOSIS — F902 Attention-deficit hyperactivity disorder, combined type: Secondary | ICD-10-CM | POA: Diagnosis not present

## 2020-10-30 ENCOUNTER — Ambulatory Visit: Payer: BC Managed Care – PPO | Admitting: Adult Health

## 2020-11-06 DIAGNOSIS — E282 Polycystic ovarian syndrome: Secondary | ICD-10-CM | POA: Diagnosis not present

## 2020-11-06 DIAGNOSIS — Z6834 Body mass index (BMI) 34.0-34.9, adult: Secondary | ICD-10-CM | POA: Diagnosis not present

## 2020-11-06 DIAGNOSIS — R7303 Prediabetes: Secondary | ICD-10-CM | POA: Diagnosis not present

## 2020-11-06 DIAGNOSIS — E8881 Metabolic syndrome: Secondary | ICD-10-CM | POA: Diagnosis not present

## 2020-11-07 ENCOUNTER — Encounter: Payer: Self-pay | Admitting: Adult Health

## 2020-11-07 ENCOUNTER — Other Ambulatory Visit: Payer: Self-pay

## 2020-11-07 ENCOUNTER — Ambulatory Visit (INDEPENDENT_AMBULATORY_CARE_PROVIDER_SITE_OTHER): Payer: BC Managed Care – PPO | Admitting: Adult Health

## 2020-11-07 DIAGNOSIS — F322 Major depressive disorder, single episode, severe without psychotic features: Secondary | ICD-10-CM | POA: Diagnosis not present

## 2020-11-07 DIAGNOSIS — F411 Generalized anxiety disorder: Secondary | ICD-10-CM | POA: Diagnosis not present

## 2020-11-07 DIAGNOSIS — F902 Attention-deficit hyperactivity disorder, combined type: Secondary | ICD-10-CM

## 2020-11-07 DIAGNOSIS — F410 Panic disorder [episodic paroxysmal anxiety] without agoraphobia: Secondary | ICD-10-CM | POA: Diagnosis not present

## 2020-11-07 DIAGNOSIS — F5081 Binge eating disorder: Secondary | ICD-10-CM | POA: Diagnosis not present

## 2020-11-07 MED ORDER — FLUOXETINE HCL 40 MG PO CAPS: 40.0000 mg | ORAL_CAPSULE | Freq: Every day | ORAL | 5 refills | Status: DC

## 2020-11-07 MED ORDER — CLOMIPRAMINE HCL 50 MG PO CAPS
ORAL_CAPSULE | ORAL | 5 refills | Status: DC
Start: 2020-11-07 — End: 2021-08-22

## 2020-11-07 MED ORDER — CLONAZEPAM 0.5 MG PO TABS: ORAL_TABLET | ORAL | 2 refills | Status: DC

## 2020-11-07 MED ORDER — DEXTROAMPHETAMINE SULFATE 10 MG PO TABS: 10.0000 mg | ORAL_TABLET | Freq: Every day | ORAL | 0 refills | Status: DC
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
## 2020-11-24 DIAGNOSIS — F422 Mixed obsessional thoughts and acts: Secondary | ICD-10-CM | POA: Diagnosis not present

## 2020-11-24 DIAGNOSIS — F411 Generalized anxiety disorder: Secondary | ICD-10-CM | POA: Diagnosis not present

## 2020-11-24 DIAGNOSIS — F902 Attention-deficit hyperactivity disorder, combined type: Secondary | ICD-10-CM | POA: Diagnosis not present

## 2020-12-05 ENCOUNTER — Other Ambulatory Visit: Payer: Self-pay

## 2020-12-05 ENCOUNTER — Ambulatory Visit (INDEPENDENT_AMBULATORY_CARE_PROVIDER_SITE_OTHER): Payer: BC Managed Care – PPO | Admitting: Adult Health

## 2020-12-05 ENCOUNTER — Encounter: Payer: Self-pay | Admitting: Adult Health

## 2020-12-05 DIAGNOSIS — F321 Major depressive disorder, single episode, moderate: Secondary | ICD-10-CM

## 2020-12-05 DIAGNOSIS — R4586 Emotional lability: Secondary | ICD-10-CM | POA: Diagnosis not present

## 2020-12-05 DIAGNOSIS — G4700 Insomnia, unspecified: Secondary | ICD-10-CM

## 2020-12-05 DIAGNOSIS — Z79899 Other long term (current) drug therapy: Secondary | ICD-10-CM | POA: Diagnosis not present

## 2020-12-05 DIAGNOSIS — F902 Attention-deficit hyperactivity disorder, combined type: Secondary | ICD-10-CM | POA: Diagnosis not present

## 2020-12-05 MED ORDER — DEXTROAMPHETAMINE SULFATE 10 MG PO TABS: 10.0000 mg | ORAL_TABLET | Freq: Every day | ORAL | 0 refills | Status: DC

## 2020-12-05 NOTE — Progress Notes (Signed)
Laura Perez [id number] 28-Apr-1991 30 y.o.  Subjective:   Patient ID:  Laura Perez is a 30 y.o. (DOB [birth year]) female.  Chief Complaint: No chief complaint on file.   HPI [name] presents to the office today for follow-up of ADHD, MDD, emotional lability, and physiological insomnia.  Describes mood today as "not very good". Tearful at times. Pleasant. Mood symptoms - reports decreased depression - "not really", anxiety - "gets stressed out at times, and irritability - "at times". Reports one panic attack. Difficulties over the 4th - usually likes fireworks, but could not go. Increased worry and rumination. Stating "I haven't felt the best physically". Had a migraine x 5 days. Ozempic was also increased and is having issues with tolerability. Enjoying new puppy - "Sam". Has been able to drive - not as nervous. Upcoming disability hearing for multiple psychiatric disabilities. Stable interest and motivation. Taking medications as prescribed.  Energy levels stable. Active, does not have a regular exercise routine.  Enjoys some usual interests and activities. Single. Has a boyfriend. Lives with parents. Spending time with family and her best friends.  Appetite decreased. Weight loss with Ozempic - 223 pounds. Feels like the Ozempic makes her feel sick - does recently increased. Working with dietician. Sleeps better some nights than others. Averages 10 hours - "I don't feel rested".  Focus and concentration difficulties. Completing tasks. Managing aspects of household. Unemployed. Has tried to work, but has increased physical disabilities. Denies SI or HI.  Denies AH or VH. Dr Cori Razor - therapist.  Previous medication trials: Zoloft, lamictal, Celexa, Elavil  Has not tried - Prozac, Effexor, lexapro, Clomiprimine, Abilify, Rexulti, Seroquel, Latuda, Buspar    PHQ2-9    Flowsheet Row Office Visit from 05/13/2016 in [facility]  PHQ-2 Total Score 4  PHQ-9 Total  Score 16      Flowsheet Row ED from 09/30/2020 in [facility] ED from 06/17/2020 in [facility]  C-SSRS RISK CATEGORY No Risk No Risk        Review of Systems:  Review of Systems  Musculoskeletal:  Negative for gait problem.  Neurological:  Negative for tremors.  Psychiatric/Behavioral:         Please refer to HPI   Medications: I have reviewed the patient's current medications.  Current Outpatient Medications  Medication Sig Dispense Refill   beclomethasone (QVAR) 80 MCG/ACT inhaler Inhale 1 puff into the lungs daily as needed (wheezing).     clomiPRAMINE (ANAFRANIL) 50 MG capsule 1 capsule at bedtime 30 capsule 5   clonazePAM (KLONOPIN) 0.5 MG tablet Take one tablet twice daily. 60 tablet 2   dextroamphetamine (DEXTROSTAT) 10 MG tablet Take 1 tablet (10 mg total) by mouth daily. 30 tablet 0   diclofenac (VOLTAREN) 75 MG EC tablet Take 1 tablet (75 mg total) by mouth 2 (two) times daily. (Patient not taking: No sig reported) 60 tablet 1   FLUoxetine (PROZAC) 40 MG capsule Take 1 capsule (40 mg total) by mouth at bedtime. 30 capsule 5   Ibuprofen-diphenhydrAMINE Cit (ADVIL PM PO) Take 2 tablets by mouth daily as needed (pain).     NIKKI 3-0.02 MG tablet Take 1 tablet by mouth daily.     ondansetron (ZOFRAN ODT) 4 MG disintegrating tablet Take one tab by mouth Q6hr prn nausea (Dissolve under tongue) (Patient taking differently: Take 4 mg by mouth every 8 (eight) hours as needed for nausea or vomiting.) 12 tablet 0   OZEMPIC, 0.25 OR  0.5 MG/DOSE, 2 MG/1.5ML SOPN Inject 0.5 mg into the skin once a week. Friday     pantoprazole (PROTONIX) 20 MG tablet Take 1 tablet (20 mg total) by mouth daily. 20 tablet 0   VIBERZI 100 MG TABS Take 1 tablet by mouth 2 (two) times daily.     No current facility-administered medications for this visit.    Medication Side Effects: None  Allergies: No Known Allergies  Past Medical  History:  Diagnosis Date   ADHD    Anxiety    Asthma    Depression    GERD (gastroesophageal reflux disease)    IBS (irritable bowel syndrome)    PCOS (polycystic ovarian syndrome)     Past Medical History, Surgical history, Social history, and Family history were reviewed and updated as appropriate.   Please see review of systems for further details on the patient's review from today.   Objective:   Physical Exam:  There were no vitals taken for this visit.  Physical Exam Constitutional:      General: She is not in acute distress. Musculoskeletal:        General: No deformity.  Neurological:     Mental Status: She is alert and oriented to person, place, and time.     Coordination: Coordination normal.  Psychiatric:        Attention and Perception: Attention and perception normal. She does not perceive auditory or visual hallucinations.        Mood and Affect: Mood normal. Mood is not anxious or depressed. Affect is not labile, blunt, angry or inappropriate.        Speech: Speech normal.        Behavior: Behavior normal.        Thought Content: Thought content normal. Thought content is not paranoid or delusional. Thought content does not include homicidal or suicidal ideation. Thought content does not include homicidal or suicidal plan.        Cognition and Memory: Cognition and memory normal.        Judgment: Judgment normal.     Comments: Insight intact    Lab Review:     Component Value Date/Time   NA 138 06/17/2020 2237   NA 139 03/05/2017 1055   K 4.6 06/17/2020 2237   CL 109 06/17/2020 2237   CO2 18 (L) 06/17/2020 2237   GLUCOSE 84 06/17/2020 2237   BUN 19 06/17/2020 2237   BUN 14 03/05/2017 1055   CREATININE 0.93 06/17/2020 2237   CALCIUM 8.9 06/17/2020 2237   PROT 8.6 (H) 06/17/2020 1647   PROT 6.9 03/05/2017 1055   ALBUMIN 4.5 06/17/2020 1647   ALBUMIN 4.2 03/05/2017 1055   AST 38 06/17/2020 1647   ALT 47 (H) 06/17/2020 1647   ALKPHOS 86 06/17/2020  1647   BILITOT 0.5 06/17/2020 1647   BILITOT 0.3 03/05/2017 1055   GFRNONAA >60 06/17/2020 2237   GFRAA 123 03/05/2017 1055       Component Value Date/Time   WBC 17.3 (H) 06/17/2020 1647   RBC 5.54 (H) 06/17/2020 1647   HGB 15.8 (H) 06/17/2020 1647   HGB 12.5 05/15/2016 1156   HCT 53.7 (H) 06/17/2020 1647   HCT 38.4 05/15/2016 1156   PLT 231 06/17/2020 1647   MCV 96.9 06/17/2020 1647   MCV 85 05/15/2016 1156   MCH 28.5 06/17/2020 1647   MCHC 29.4 (L) 06/17/2020 1647   RDW 13.6 06/17/2020 1647   RDW 14.1 05/15/2016 1156   LYMPHSABS 2.9 05/15/2016 1156  EOSABS 0.1 05/15/2016 1156   BASOSABS 0.1 05/15/2016 1156    No results found for: POCLITH, LITHIUM   No results found for: PHENYTOIN, PHENOBARB, VALPROATE, CBMZ   .res Assessment: Plan:     Plan:  1. Clopimramine 50mg  at hs 2. Prozac 40mg  daily 3. Adderall 10mg  as needed 4. Clonazepam 0.5mg  - 2 at hs  Clomiramine level - lab slip given.  Sumitriptam Virbezi Birth control   4 weeks  Discussed potential benefits, risks, and side effects of stimulants with patient to include increased heart rate, palpitations, insomnia, increased anxiety, increased irritability, or decreased appetite.  Instructed patient to contact office if experiencing any significant tolerability issues.  Discussed potential benefits, risk, and side effects of benzodiazepines to include potential risk of tolerance and dependence, as well as possible drowsiness.  Advised patient not to drive if experiencing drowsiness and to take lowest possible effective dose to minimize risk of dependence and tolerance.  Patient advised to contact office with any questions, adverse effects, or acute worsening in signs and symptoms.   Diagnoses and all orders for this visit:  High risk medication use -     Clomipramine  Attention deficit hyperactivity disorder (ADHD), combined type, moderate -     dextroamphetamine (DEXTROSTAT) 10 MG tablet; Take 1  tablet (10 mg total) by mouth daily.  Moderate major depression, single episode (HCC)  Emotional lability  Physiological insomnia    Please see After Visit Summary for patient specific instructions.  Future Appointments  Date Time Provider Department Center  01/07/2021  1:00 PM Buzz Axel, , NP [facility] None     Orders Placed This Encounter  Procedures   Clomipramine     -------------------------------

## 2020-12-06 ENCOUNTER — Emergency Department (HOSPITAL_BASED_OUTPATIENT_CLINIC_OR_DEPARTMENT_OTHER): Payer: BC Managed Care – PPO

## 2020-12-06 ENCOUNTER — Encounter (HOSPITAL_BASED_OUTPATIENT_CLINIC_OR_DEPARTMENT_OTHER): Payer: Self-pay | Admitting: Emergency Medicine

## 2020-12-06 ENCOUNTER — Emergency Department (HOSPITAL_BASED_OUTPATIENT_CLINIC_OR_DEPARTMENT_OTHER)
Admission: EM | Admit: 2020-12-06 | Discharge: 2020-12-07 | Disposition: A | Discharge status: Home / Self Care | Payer: BC Managed Care – PPO | Attending: Emergency Medicine | Admitting: Emergency Medicine

## 2020-12-06 ENCOUNTER — Other Ambulatory Visit: Payer: Self-pay

## 2020-12-06 DIAGNOSIS — Z79899 Other long term (current) drug therapy: Secondary | ICD-10-CM | POA: Insufficient documentation

## 2020-12-06 DIAGNOSIS — E860 Dehydration: Secondary | ICD-10-CM | POA: Diagnosis not present

## 2020-12-06 DIAGNOSIS — J45909 Unspecified asthma, uncomplicated: Secondary | ICD-10-CM | POA: Diagnosis not present

## 2020-12-06 DIAGNOSIS — R112 Nausea with vomiting, unspecified: Secondary | ICD-10-CM

## 2020-12-06 DIAGNOSIS — R109 Unspecified abdominal pain: Secondary | ICD-10-CM

## 2020-12-06 DIAGNOSIS — R0602 Shortness of breath: Secondary | ICD-10-CM | POA: Diagnosis not present

## 2020-12-06 DIAGNOSIS — R1031 Right lower quadrant pain: Secondary | ICD-10-CM | POA: Insufficient documentation

## 2020-12-06 LAB — COMPREHENSIVE METABOLIC PANEL
ALT: 34 U/L (ref 0–44)
AST: 25 U/L (ref 15–41)
Albumin: 4.2 g/dL (ref 3.5–5.0)
Alkaline Phosphatase: 69 U/L (ref 38–126)
Anion gap: 10 (ref 5–15)
BUN: 13 mg/dL (ref 6–20)
CO2: 25 mmol/L (ref 22–32)
Calcium: 9.5 mg/dL (ref 8.9–10.3)
Chloride: 103 mmol/L (ref 98–111)
Creatinine, Ser: 0.88 mg/dL (ref 0.44–1.00)
GFR, Estimated: >60 mL/min (ref >60)
Glucose, Bld: 84 mg/dL (ref 70–99)
Potassium: 4 mmol/L (ref 3.5–5.1)
Sodium: 138 mmol/L (ref 135–145)
Total Bilirubin: 0.3 mg/dL (ref 0.3–1.2)
Total Protein: 7.3 g/dL (ref 6.5–8.1)

## 2020-12-06 LAB — URINALYSIS, ROUTINE W REFLEX MICROSCOPIC
Bilirubin Urine: NEGATIVE
Glucose, UA: NEGATIVE mg/dL
Hgb urine dipstick: NEGATIVE
Ketones, ur: NEGATIVE mg/dL
Nitrite: NEGATIVE
Protein, ur: 30 mg/dL — AB
Specific Gravity, Urine: 1.031 — ABNORMAL HIGH (ref 1.005–1.030)
pH: 5.5 (ref 5.0–8.0)

## 2020-12-06 LAB — CBC WITH DIFFERENTIAL/PLATELET
Abs Immature Granulocytes: 0.02 10*3/uL (ref 0.00–0.07)
Basophils Absolute: 0.1 10*3/uL (ref 0.0–0.1)
Basophils Relative: 1 %
Eosinophils Absolute: 0.2 10*3/uL (ref 0.0–0.5)
Eosinophils Relative: 2 %
HCT: 41.1 % (ref 36.0–46.0)
Hemoglobin: 13.4 g/dL (ref 12.0–15.0)
Immature Granulocytes: 0 %
Lymphocytes Relative: 38 %
Lymphs Abs: 3.8 10*3/uL (ref 0.7–4.0)
MCH: 28.2 pg (ref 26.0–34.0)
MCHC: 32.6 g/dL (ref 30.0–36.0)
MCV: 86.5 fL (ref 80.0–100.0)
Monocytes Absolute: 0.6 10*3/uL (ref 0.1–1.0)
Monocytes Relative: 6 %
Neutro Abs: 5.5 10*3/uL (ref 1.7–7.7)
Neutrophils Relative %: 53 %
Platelets: 291 10*3/uL (ref 150–400)
RBC: 4.75 MIL/uL (ref 3.87–5.11)
RDW: 13.7 % (ref 11.5–15.5)
WBC: 10.2 10*3/uL (ref 4.0–10.5)
nRBC: 0 % (ref 0.0–0.2)

## 2020-12-06 LAB — LIPASE, BLOOD: Lipase: 28 U/L (ref 11–51)

## 2020-12-06 MED ORDER — HYDROMORPHONE HCL 1 MG/ML IJ SOLN
1.0000 mg | Freq: Once | INTRAMUSCULAR | Status: AC
  Administered 2020-12-06: 1 mg via INTRAVENOUS
  Filled 2020-12-06: qty 1

## 2020-12-06 MED ORDER — METOCLOPRAMIDE HCL 5 MG/ML IJ SOLN
10.0000 mg | Freq: Once | INTRAMUSCULAR | Status: AC
  Administered 2020-12-06: 10 mg via INTRAVENOUS
  Filled 2020-12-06: qty 2

## 2020-12-06 MED ORDER — SODIUM CHLORIDE 0.9 % IV BOLUS
1000.0000 mL | Freq: Once | INTRAVENOUS | Status: AC
  Administered 2020-12-06: 1000 mL via INTRAVENOUS

## 2020-12-06 NOTE — ED Triage Notes (Signed)
Reports N/V x1 week. Abd pain RLQ x4 days. States sent by PCP for concern for appendicitis. Took ozepic 1MG  on 7/7 and initially thought s/s were related to this medication.

## 2020-12-06 NOTE — ED Provider Notes (Signed)
[facility] EMERGENCY DEPT Provider Note   CSN: [id number] Arrival date & time: 12/06/20  1730     History Chief Complaint  Patient presents with   Abdominal Pain    Laura Perez is a 30 y.o. female.  HPI 30 year old female presents with vomiting, dehydration, and need to rule out appendicitis.  She been having nausea and vomiting for 1 week.  She recently had her Ozempic increased from 0.5 mg to 1 mg.  She wonders if the symptoms are related to that.  She is been having abdominal discomfort during this time as well, especially the last 4 days in the right lower abdomen.  No fevers or back pain.  No urinary symptoms or diarrhea though she has had some constipation.  Pain is rated as a 5-7 at times.  Went to her PCPs office and they are working to give her a shot of IM Phenergan but because of her abdominal pain they thought she needed to come to the ED for rule out appendicitis.  She is also been winded whenever she gets up to walk.  No chest pain.  She has been taking Zofran with no relief.  Past Medical History:  Diagnosis Date   ADHD    Anxiety    Asthma    Depression    GERD (gastroesophageal reflux disease)    IBS (irritable bowel syndrome)    PCOS (polycystic ovarian syndrome)     Patient Active Problem List   Diagnosis Date Noted   Severe major depression, single episode, without psychotic features (HCC) 07/27/2018   Panic disorder 06/08/2018   Generalized anxiety disorder 06/08/2018   Binge eating disorder 06/08/2018   Class 1 obesity with serious comorbidity and body mass index (BMI) of 33.0 to 33.9 in adult 06/18/2017   Insulin resistance 02/02/2017   Class 1 obesity with serious comorbidity and body mass index (BMI) of 34.0 to 34.9 in adult 02/02/2017   Class 2 obesity without serious comorbidity with body mass index (BMI) of 35.0 to 35.9 in adult 01/19/2017   Hyperlipidemia 10/22/2016   Vitamin D deficiency 10/06/2016   PCOS (polycystic ovarian  syndrome) 10/06/2016   Class 2 obesity with serious comorbidity and body mass index (BMI) of 35.0 to 35.9 in adult 10/06/2016   Attention deficit hyperactivity disorder (ADHD), combined type, moderate 05/13/2016   Allergic rhinitis 05/13/2016   Absence of menstruation 05/11/2015   Left knee pain 10/27/2014    History reviewed. No pertinent surgical history.   OB History     Gravida  0   Para  0   Term  0   Preterm  0   AB  0   Living         SAB  0   IAB  0   Ectopic  0   Multiple      Live Births              Family History  Adopted: Yes    Social History   Tobacco Use   Smoking status: Never   Smokeless tobacco: Never  Vaping Use   Vaping Use: Never used  Substance Use Topics   Alcohol use: No    Alcohol/week: 0.0 standard drinks   Drug use: Never    Home Medications Prior to Admission medications   Medication Sig Start Date End Date Taking? Authorizing Provider  beclomethasone (QVAR) 80 MCG/ACT inhaler Inhale 1 puff into the lungs daily as needed (wheezing). 05/11/15   [provider]  clomiPRAMINE (ANAFRANIL) 50 MG capsule 1 capsule at bedtime 11/07/20   Mozingo, Thereasa Solo, NP  clonazePAM (KLONOPIN) 0.5 MG tablet Take one tablet twice daily. 11/07/20   Mozingo, Thereasa Solo, NP  dextroamphetamine (DEXTROSTAT) 10 MG tablet Take 1 tablet (10 mg total) by mouth daily. 12/05/20   Mozingo, Thereasa Solo, NP  diclofenac (VOLTAREN) 75 MG EC tablet Take 1 tablet (75 mg total) by mouth 2 (two) times daily. Patient not taking: No sig reported 05/21/16   Carrington Clamp, DPM  FLUoxetine (PROZAC) 40 MG capsule Take 1 capsule (40 mg total) by mouth at bedtime. 11/07/20   Mozingo, Thereasa Solo, NP  Ibuprofen-diphenhydrAMINE Cit (ADVIL PM PO) Take 2 tablets by mouth daily as needed (pain).    [provider]  NIKKI 3-0.02 MG tablet Take 1 tablet by mouth daily. 06/02/20   [provider]  ondansetron (ZOFRAN ODT) 4 MG  disintegrating tablet Take one tab by mouth Q6hr prn nausea (Dissolve under tongue) Patient taking differently: Take 4 mg by mouth every 8 (eight) hours as needed for nausea or vomiting. 04/09/16   Lattie Haw, MD  OZEMPIC, 0.25 OR 0.5 MG/DOSE, 2 MG/1.5ML SOPN Inject 0.5 mg into the skin once a week. Friday 05/30/20   [provider]  pantoprazole (PROTONIX) 20 MG tablet Take 1 tablet (20 mg total) by mouth daily. 06/17/20   Bethann Berkshire, MD  VIBERZI 100 MG TABS Take 1 tablet by mouth 2 (two) times daily. 04/04/20   [provider]    Allergies    Patient has no known allergies.  Review of Systems   Review of Systems  Constitutional:  Negative for fever.  Respiratory:  Positive for shortness of breath.   Cardiovascular:  Negative for chest pain.  Gastrointestinal:  Positive for abdominal pain, nausea and vomiting. Negative for diarrhea.  Genitourinary:  Negative for dysuria.  Musculoskeletal:  Negative for back pain.  All other systems reviewed and are negative.  Physical Exam Updated Vital Signs BP 126/86   Pulse 83   Temp 98.8 F (37.1 C) (Oral)   Resp 17   LMP 11/22/2020   SpO2 100%   Physical Exam Vitals and nursing note reviewed.  Constitutional:      General: She is not in acute distress.    Appearance: She is well-developed. She is obese. She is not ill-appearing or diaphoretic.  HENT:     Head: Normocephalic and atraumatic.     Right Ear: External ear normal.     Left Ear: External ear normal.     Nose: Nose normal.  Eyes:     General:        Right eye: No discharge.        Left eye: No discharge.  Cardiovascular:     Rate and Rhythm: Normal rate and regular rhythm.     Heart sounds: Normal heart sounds.  Pulmonary:     Effort: Pulmonary effort is normal.     Breath sounds: Normal breath sounds.  Abdominal:     Palpations: Abdomen is soft.     Tenderness: There is abdominal tenderness in the right lower quadrant. There is right CVA  tenderness. There is no left CVA tenderness.  Skin:    General: Skin is warm and dry.  Neurological:     Mental Status: She is alert.  Psychiatric:        Mood and Affect: Mood is not anxious.    ED Results / Procedures / Treatments  Labs (all labs ordered are listed, but only abnormal results are displayed) Labs Reviewed  URINALYSIS, ROUTINE W REFLEX MICROSCOPIC - Abnormal; Notable for the following components:      Result Value   APPearance HAZY (*)    Specific Gravity, Urine 1.031 (*)    Protein, ur 30 (*)    Leukocytes,Ua LARGE (*)    All other components within normal limits  COMPREHENSIVE METABOLIC PANEL  LIPASE, BLOOD  CBC WITH DIFFERENTIAL/PLATELET  PREGNANCY, URINE    EKG None  Radiology DG Chest Portable 1 View  Result Date: 12/06/2020 CLINICAL DATA:  Shortness of breath EXAM: PORTABLE CHEST 1 VIEW COMPARISON:  10/12/2017 FINDINGS: The heart size and mediastinal contours are within normal limits. Both lungs are clear. The visualized skeletal structures are unremarkable. IMPRESSION: No active disease. Electronically Signed   By: Alcide Clever M.D.   On: 12/06/2020 23:34    Procedures Procedures   Medications Ordered in ED Medications  sodium chloride 0.9 % bolus 1,000 mL (1,000 mLs Intravenous New Bag/Given 12/06/20 2316)  HYDROmorphone (DILAUDID) injection 1 mg (1 mg Intravenous Given 12/06/20 2320)  metoCLOPramide (REGLAN) injection 10 mg (10 mg Intravenous Given 12/06/20 2320)    ED Course  I have reviewed the triage vital signs and the nursing notes.  Pertinent labs & imaging results that were available during my care of the patient were reviewed by me and considered in my medical decision making (see chart for details).    MDM Rules/Calculators/A&P                          Patient is well-appearing with stable vital signs.  She will be given pain control, nausea control, and fluids.  Will ultimately need a CT assuming a negative pregnancy test to  further evaluate for this abdominal pain and vomiting.  Care transferred to Dr. Rubin Payor. Final Clinical Impression(s) / ED Diagnoses Final diagnoses:  None    Rx / DC Orders ED Discharge Orders     None        Pricilla Loveless, MD 12/06/20 2356

## 2020-12-07 ENCOUNTER — Encounter (HOSPITAL_BASED_OUTPATIENT_CLINIC_OR_DEPARTMENT_OTHER): Payer: Self-pay | Admitting: Radiology

## 2020-12-07 DIAGNOSIS — R1110 Vomiting, unspecified: Secondary | ICD-10-CM | POA: Diagnosis not present

## 2020-12-07 DIAGNOSIS — R109 Unspecified abdominal pain: Secondary | ICD-10-CM | POA: Diagnosis not present

## 2020-12-07 LAB — PREGNANCY, URINE: Preg Test, Ur: NEGATIVE

## 2020-12-07 MED ORDER — IOHEXOL 300 MG/ML  SOLN
100.0000 mL | Freq: Once | INTRAMUSCULAR | Status: AC | PRN
  Administered 2020-12-07: 100 mL via INTRAVENOUS

## 2020-12-07 MED ORDER — METOCLOPRAMIDE HCL 10 MG PO TABS: 10.0000 mg | ORAL_TABLET | Freq: Four times a day (QID) | ORAL | 0 refills | Status: AC

## 2020-12-07 NOTE — ED Provider Notes (Signed)
  Physical Exam  BP 113/64   Pulse 88   Temp 98.4 F (36.9 C)   Resp 16   LMP 11/22/2020 Comment: negative pregnancy test  SpO2 98%   Physical Exam  ED Course/Procedures     Procedures  MDM  Patient with abdominal pain.  Nausea and vomiting.  Recent medication changes.  CT scan done reassuring.  Feeling better.  Tolerating orals.  Sedation states she has not felt this good in a few days.  Will discharge home with some Reglan to see if it helps.       Benjiman Core, MD 12/07/20 [phone]

## 2020-12-07 NOTE — ED Notes (Signed)
Pt resting said this is the first time in 2 days that she had no pain

## 2020-12-07 NOTE — ED Notes (Signed)
Back from ct.

## 2020-12-07 NOTE — ED Notes (Signed)
PT ALERT AND ORIENTED, VERBALIZED UNDERSTANDING OF DISCHARGE INSTRUCTIONS.

## 2020-12-07 NOTE — ED Notes (Signed)
Pt going to ct  

## 2020-12-10 DIAGNOSIS — R7303 Prediabetes: Secondary | ICD-10-CM | POA: Diagnosis not present

## 2020-12-10 DIAGNOSIS — E282 Polycystic ovarian syndrome: Secondary | ICD-10-CM | POA: Diagnosis not present

## 2020-12-10 DIAGNOSIS — R109 Unspecified abdominal pain: Secondary | ICD-10-CM | POA: Diagnosis not present

## 2020-12-10 DIAGNOSIS — E8881 Metabolic syndrome: Secondary | ICD-10-CM | POA: Diagnosis not present

## 2020-12-10 DIAGNOSIS — Z713 Dietary counseling and surveillance: Secondary | ICD-10-CM | POA: Diagnosis not present

## 2020-12-10 DIAGNOSIS — R197 Diarrhea, unspecified: Secondary | ICD-10-CM | POA: Diagnosis not present

## 2020-12-30 DIAGNOSIS — F411 Generalized anxiety disorder: Secondary | ICD-10-CM | POA: Diagnosis not present

## 2020-12-30 DIAGNOSIS — F422 Mixed obsessional thoughts and acts: Secondary | ICD-10-CM | POA: Diagnosis not present

## 2020-12-30 DIAGNOSIS — F902 Attention-deficit hyperactivity disorder, combined type: Secondary | ICD-10-CM | POA: Diagnosis not present

## 2021-01-07 ENCOUNTER — Ambulatory Visit: Payer: BC Managed Care – PPO | Admitting: Adult Health

## 2021-01-07 ENCOUNTER — Telehealth: Payer: Self-pay | Admitting: Adult Health

## 2021-01-07 NOTE — Telephone Encounter (Signed)
We had to CA pt appt today. RS for 8/25 Please RF Klonopin to her pharmacy, She is going out of town tomorrow. [facility] [phone] - [place], [place] - [address]

## 2021-01-08 ENCOUNTER — Other Ambulatory Visit: Payer: Self-pay | Admitting: Adult Health

## 2021-01-08 DIAGNOSIS — F410 Panic disorder [episodic paroxysmal anxiety] without agoraphobia: Secondary | ICD-10-CM

## 2021-01-08 DIAGNOSIS — F411 Generalized anxiety disorder: Secondary | ICD-10-CM

## 2021-01-08 DIAGNOSIS — F5081 Binge eating disorder: Secondary | ICD-10-CM

## 2021-01-08 MED ORDER — CLONAZEPAM 0.5 MG PO TABS: ORAL_TABLET | ORAL | 2 refills | Status: DC

## 2021-01-08 NOTE — Telephone Encounter (Signed)
Script sent  

## 2021-01-17 ENCOUNTER — Ambulatory Visit: Payer: BC Managed Care – PPO | Admitting: Adult Health

## 2021-01-20 DIAGNOSIS — F902 Attention-deficit hyperactivity disorder, combined type: Secondary | ICD-10-CM | POA: Diagnosis not present

## 2021-01-20 DIAGNOSIS — F411 Generalized anxiety disorder: Secondary | ICD-10-CM | POA: Diagnosis not present

## 2021-01-20 DIAGNOSIS — F422 Mixed obsessional thoughts and acts: Secondary | ICD-10-CM | POA: Diagnosis not present

## 2021-02-24 DIAGNOSIS — F902 Attention-deficit hyperactivity disorder, combined type: Secondary | ICD-10-CM | POA: Diagnosis not present

## 2021-02-24 DIAGNOSIS — F411 Generalized anxiety disorder: Secondary | ICD-10-CM | POA: Diagnosis not present

## 2021-02-24 DIAGNOSIS — F422 Mixed obsessional thoughts and acts: Secondary | ICD-10-CM | POA: Diagnosis not present

## 2021-03-09 DIAGNOSIS — R112 Nausea with vomiting, unspecified: Secondary | ICD-10-CM | POA: Diagnosis not present

## 2021-03-29 DIAGNOSIS — S83002A Unspecified subluxation of left patella, initial encounter: Secondary | ICD-10-CM | POA: Diagnosis not present

## 2021-04-21 DIAGNOSIS — J329 Chronic sinusitis, unspecified: Secondary | ICD-10-CM | POA: Diagnosis not present

## 2021-04-21 DIAGNOSIS — B9689 Other specified bacterial agents as the cause of diseases classified elsewhere: Secondary | ICD-10-CM | POA: Diagnosis not present

## 2021-04-21 DIAGNOSIS — R059 Cough, unspecified: Secondary | ICD-10-CM | POA: Diagnosis not present

## 2021-04-22 ENCOUNTER — Other Ambulatory Visit: Payer: Self-pay

## 2021-04-22 ENCOUNTER — Emergency Department (HOSPITAL_BASED_OUTPATIENT_CLINIC_OR_DEPARTMENT_OTHER): Payer: BC Managed Care – PPO | Admitting: Radiology

## 2021-04-22 ENCOUNTER — Encounter (HOSPITAL_BASED_OUTPATIENT_CLINIC_OR_DEPARTMENT_OTHER): Payer: Self-pay

## 2021-04-22 DIAGNOSIS — Z20822 Contact with and (suspected) exposure to covid-19: Secondary | ICD-10-CM | POA: Diagnosis not present

## 2021-04-22 DIAGNOSIS — J069 Acute upper respiratory infection, unspecified: Secondary | ICD-10-CM | POA: Diagnosis not present

## 2021-04-22 DIAGNOSIS — J45909 Unspecified asthma, uncomplicated: Secondary | ICD-10-CM | POA: Diagnosis not present

## 2021-04-22 DIAGNOSIS — R0602 Shortness of breath: Secondary | ICD-10-CM | POA: Diagnosis not present

## 2021-04-22 LAB — RESP PANEL BY RT-PCR (FLU A&B, COVID) ARPGX2
Influenza A by PCR: NEGATIVE
Influenza B by PCR: NEGATIVE
SARS Coronavirus 2 by RT PCR: NEGATIVE

## 2021-04-22 MED ORDER — ALBUTEROL SULFATE HFA 108 (90 BASE) MCG/ACT IN AERS: 2.0000 | INHALATION_SPRAY | RESPIRATORY_TRACT | Status: DC | PRN

## 2021-04-22 NOTE — ED Triage Notes (Signed)
Pt presents w/congestion, headache, cough, sneezing and SOBx2 days. Seen at [facility] yesterday. They prescribed Amoxicillin 3 tabs/day for 7 days, pt reports taking 2 tabs yesterday and 2 tabs today. Pt here for further evaluation d/t SOB

## 2021-04-23 ENCOUNTER — Emergency Department (HOSPITAL_BASED_OUTPATIENT_CLINIC_OR_DEPARTMENT_OTHER)
Admission: EM | Admit: 2021-04-23 | Discharge: 2021-04-23 | Disposition: A | Discharge status: Home / Self Care | Payer: BC Managed Care – PPO | Attending: Emergency Medicine | Admitting: Emergency Medicine

## 2021-04-23 ENCOUNTER — Encounter (HOSPITAL_BASED_OUTPATIENT_CLINIC_OR_DEPARTMENT_OTHER): Payer: Self-pay | Admitting: Emergency Medicine

## 2021-04-23 DIAGNOSIS — J069 Acute upper respiratory infection, unspecified: Secondary | ICD-10-CM

## 2021-04-23 DIAGNOSIS — R0981 Nasal congestion: Secondary | ICD-10-CM

## 2021-04-23 MED ORDER — IBUPROFEN 800 MG PO TABS
ORAL_TABLET | ORAL | Status: AC
  Filled 2021-04-23: qty 1

## 2021-04-23 MED ORDER — LORATADINE 10 MG PO TABS
ORAL_TABLET | ORAL | Status: AC
  Filled 2021-04-23: qty 1

## 2021-04-23 MED ORDER — ACETAMINOPHEN 500 MG PO TABS
1000.0000 mg | ORAL_TABLET | Freq: Once | ORAL | Status: AC
  Administered 2021-04-23: 1000 mg via ORAL

## 2021-04-23 MED ORDER — LORATADINE 10 MG PO TABS: 10.0000 mg | ORAL_TABLET | Freq: Every day | ORAL | 0 refills | Status: AC

## 2021-04-23 MED ORDER — LORATADINE 10 MG PO TABS
10.0000 mg | ORAL_TABLET | Freq: Once | ORAL | Status: AC
  Administered 2021-04-23: 10 mg via ORAL

## 2021-04-23 MED ORDER — ACETAMINOPHEN 500 MG PO TABS
ORAL_TABLET | ORAL | Status: AC
  Filled 2021-04-23: qty 2

## 2021-04-23 MED ORDER — IBUPROFEN 800 MG PO TABS
800.0000 mg | ORAL_TABLET | Freq: Once | ORAL | Status: AC
  Administered 2021-04-23: 800 mg via ORAL

## 2021-04-23 NOTE — ED Provider Notes (Signed)
[facility] EMERGENC[facility] DEPT Provider Note   CSN: [id number] Arrival date & time: 04/22/21  2021     History Chief Complaint  Patient presents with   Shortness of Breath    Laura Perez is a 30 y.o. female.  The history is provided by the patient.  URI Presenting symptoms: congestion, cough, facial pain and rhinorrhea   Presenting symptoms: no fatigue, no fever and no sore throat   Congestion:    Location:  Nasal   Interferes with sleep: no     Interferes with eating/drinking: no   Cough:    Cough characteristics:  Non-productive   Severity:  Mild   Onset quality:  Gradual   Duration:  3 days   Timing:  Rare   Progression:  Unchanged   Chronicity:  New Severity:  Mild Onset quality:  Gradual Duration:  3 days Timing:  Intermittent Progression:  Unchanged Chronicity:  New Relieved by:  Nothing Worsened by:  Nothing Ineffective treatments:  None tried Associated symptoms: no arthralgias, no headaches, no myalgias, no neck pain and no wheezing   Associated symptoms comment:  No changes in vision or speech or cognition Risk factors: not elderly   Was started immediately on amoxicillin but symptoms have not improved.  No chest pain.  Patient feels she can't breath well because nose is stopped up.  No chest pain SOB or leg pain.      Past Medical History:  Diagnosis Date   ADHD    Anxiety    Asthma    Depression    GERD (gastroesophageal reflux disease)    IBS (irritable bowel syndrome)    PCOS (polycystic ovarian syndrome)     Patient Active Problem List   Diagnosis Date Noted   Severe major depression, single episode, without psychotic features ([place]) 07/27/2018   Panic disorder 06/08/2018   Generalized anxiety disorder 06/08/2018   Binge eating disorder 06/08/2018   Class 1 obesity with serious comorbidity and body mass index (BMI) of 33.0 to 33.9 in adult 06/18/2017   Insulin resistance 02/02/2017   Class 1 obesity with serious  comorbidity and body mass index (BMI) of 34.0 to 34.9 in adult 02/02/2017   Class 2 obesity without serious comorbidity with body mass index (BMI) of 35.0 to 35.9 in adult 01/19/2017   Hyperlipidemia 10/22/2016   Vitamin D deficiency 10/06/2016   PCOS (polycystic ovarian syndrome) 10/06/2016   Class 2 obesity with serious comorbidity and body mass index (BMI) of 35.0 to 35.9 in adult 10/06/2016   Attention deficit hyperactivity disorder (ADHD), combined type, moderate 05/13/2016   Allergic rhinitis 05/13/2016   Absence of menstruation 05/11/2015   Left knee pain 10/27/2014    History reviewed. No pertinent surgical history.   OB History     Gravida  0   Para  0   Term  0   Preterm  0   AB  0   Living         SAB  0   IAB  0   Ectopic  0   Multiple      Live Births              Family History  Adopted: Yes    Social History   Tobacco Use   Smoking status: Never   Smokeless tobacco: Never  Vaping Use   Vaping Use: Never used  Substance Use Topics   Alcohol use: No    Alcohol/week: 0.0 standard drinks   Drug use: Never  Home Medications Prior to Admission medications   Medication Sig Start Date End Date Taking? Authorizing Provider  beclomethasone (QVAR) 80 MCG/ACT inhaler Inhale 1 puff into the lungs daily as needed (wheezing). 05/11/15   [provider]  clomiPRAMINE (ANAFRANIL) 50 MG capsule 1 capsule at bedtime 11/07/20   Mozingo, Thereasa Solo, NP  clonazePAM (KLONOPIN) 0.5 MG tablet Take one tablet twice daily. 01/08/21   Mozingo, Thereasa Solo, NP  dextroamphetamine (DEXTROSTAT) 10 MG tablet Take 1 tablet (10 mg total) by mouth daily. 12/05/20   Mozingo, Thereasa Solo, NP  diclofenac (VOLTAREN) 75 MG EC tablet Take 1 tablet (75 mg total) by mouth 2 (two) times daily. Patient not taking: No sig reported 05/21/16   Carrington Clamp, DPM  FLUoxetine (PROZAC) 40 MG capsule Take 1 capsule (40 mg total) by mouth at bedtime. 11/07/20    Mozingo, Thereasa Solo, NP  Ibuprofen-diphenhydrAMINE Cit (ADVIL PM PO) Take 2 tablets by mouth daily as needed (pain).    [provider]  metoCLOPramide (REGLAN) 10 MG tablet Take 1 tablet (10 mg total) by mouth every 6 (six) hours. 12/07/20   Benjiman Core, MD  NIKKI 3-0.02 MG tablet Take 1 tablet by mouth daily. 06/02/20   [provider]  ondansetron (ZOFRAN ODT) 4 MG disintegrating tablet Take one tab by mouth Q6hr prn nausea (Dissolve under tongue) Patient taking differently: Take 4 mg by mouth every 8 (eight) hours as needed for nausea or vomiting. 04/09/16   Lattie Haw, MD  OZEMPIC, 0.25 OR 0.5 MG/DOSE, 2 MG/1.5ML SOPN Inject 0.5 mg into the skin once a week. Friday 05/30/20   [provider]  pantoprazole (PROTONIX) 20 MG tablet Take 1 tablet (20 mg total) by mouth daily. 06/17/20   Bethann Berkshire, MD  VIBERZI 100 MG TABS Take 1 tablet by mouth 2 (two) times daily. 04/04/20   [provider]    Allergies    Patient has no known allergies.  Review of Systems   Review of Systems  Constitutional:  Negative for fatigue and fever.  HENT:  Positive for congestion and rhinorrhea. Negative for sore throat.   Eyes:  Negative for photophobia, redness and visual disturbance.  Respiratory:  Positive for cough. Negative for shortness of breath, wheezing and stridor.   Cardiovascular:  Negative for chest pain and leg swelling.  Gastrointestinal:  Negative for abdominal pain.  Genitourinary:  Negative for difficulty urinating.  Musculoskeletal:  Negative for arthralgias, myalgias and neck pain.  Neurological:  Negative for speech difficulty and headaches.  Psychiatric/Behavioral:  Negative for confusion.    Physical Exam Updated Vital Signs BP 125/79   Pulse 94   Temp 99.5 F (37.5 C)   Resp 20   LMP 03/22/2021   SpO2 99%   Physical Exam Vitals and nursing note reviewed.  Constitutional:      General: She is not in acute distress.     Appearance: Normal appearance.  HENT:     Head: Normocephalic and atraumatic.     Nose: Congestion and rhinorrhea present.  Eyes:     Extraocular Movements: Extraocular movements intact.     Conjunctiva/sclera: Conjunctivae normal.     Pupils: Pupils are equal, round, and reactive to light.     Comments: No proptosis disk margins sharp intact cognition   Cardiovascular:     Rate and Rhythm: Normal rate and regular rhythm.     Pulses: Normal pulses.     Heart sounds: Normal heart sounds.  Pulmonary:  Effort: Pulmonary effort is normal.     Breath sounds: Normal breath sounds.  Abdominal:     General: Abdomen is flat. Bowel sounds are normal.     Palpations: Abdomen is soft.     Tenderness: There is no abdominal tenderness. There is no guarding.  Musculoskeletal:        General: No swelling or tenderness. Normal range of motion.     Cervical back: Normal range of motion and neck supple.     Right lower leg: No edema.     Left lower leg: No edema.     Comments: Negative Homan's sign  Skin:    General: Skin is warm and dry.     Capillary Refill: Capillary refill takes less than 2 seconds.  Neurological:     General: No focal deficit present.     Mental Status: She is alert and oriented to person, place, and time.     Deep Tendon Reflexes: Reflexes normal.  Psychiatric:        Mood and Affect: Mood normal.        Behavior: Behavior normal.    ED Results / Procedures / Treatments   Labs (all labs ordered are listed, but only abnormal results are displayed) Labs Reviewed  RESP PANEL BY RT-PCR (FLU A&B, COVID) [id number]    EKG None  Radiology DG Chest 2 View  Result Date: 04/22/2021 CLINICAL DATA:  Shortness of breath EXAM: CHEST - 2 VIEW COMPARISON:  12/06/2020 FINDINGS: The heart size and mediastinal contours are within normal limits. Both lungs are clear. The visualized skeletal structures are unremarkable. IMPRESSION: No active cardiopulmonary disease. Electronically  Signed   By: Jasmine Pang M.D.   On: 04/22/2021 21:06    Procedures Procedures   Medications Ordered in ED Medications  albuterol (VENTOLIN HFA) 108 (90 Base) MCG/ACT inhaler 2 puff (has no administration in time range)  acetaminophen (TYLENOL) tablet 1,000 mg (has no administration in time range)  ibuprofen (ADVIL) tablet 800 mg (has no administration in time range)  loratadine (CLARITIN) tablet 10 mg (has no administration in time range)    ED Course  I have reviewed the triage vital signs and the nursing notes.  Pertinent labs & imaging results that were available during my care of the patient were reviewed by me and considered in my medical decision making (see chart for details).   Symptoms are consistent with URI.  Her nose is stopped up and that is why she feels she can''t breath. I do not believe this is cardiac. I do not believe the patient has a PE.  Wells score is 0.  She wants a note for work for the rest of the week.  I have given a note and advised flonase BID and claritin.    Laura Perez was evaluated in Emergency Department on 04/23/2021 for the symptoms described in the history of present illness. She was evaluated in the context of the global COVID-19 pandemic, which necessitated consideration that the patient might be at risk for infection with the SARS-CoV-2 virus that causes COVID-19. Institutional protocols and algorithms that pertain to the evaluation of patients at risk for COVID-19 are in a state of rapid change based on information released by regulatory bodies including the CDC and federal and state organizations. These policies and algorithms were followed during the patient's care in the ED.  Final Clinical Impression(s) / ED Diagnoses Final diagnoses:  None   Return for intractable cough, coughing up blood, fevers > 100.4  unrelieved by medication, shortness of breath, intractable vomiting, chest pain, shortness of breath, weakness, numbness, changes in  speech, facial asymmetry, abdominal pain, passing out, Inability to tolerate liquids or food, cough, altered mental status or any concerns. No signs of systemic illness or infection. The patient is nontoxic-appearing on exam and vital signs are within normal limits.  I have reviewed the triage vital signs and the nursing notes. Pertinent labs & imaging results that were available during my care of the patient were reviewed by me and considered in my medical decision making (see chart for details). After history, exam, and medical workup I feel the patient has been appropriately medically screened and is safe for discharge home. Pertinent diagnoses were discussed with the patient. Patient was given return precautions.  Rx / DC Orders ED Discharge Orders     None        Laura Fornes, MD 04/23/21 0222

## 2021-05-15 DIAGNOSIS — Z23 Encounter for immunization: Secondary | ICD-10-CM | POA: Diagnosis not present

## 2021-05-15 DIAGNOSIS — R7303 Prediabetes: Secondary | ICD-10-CM | POA: Diagnosis not present

## 2021-05-15 DIAGNOSIS — Z713 Dietary counseling and surveillance: Secondary | ICD-10-CM | POA: Diagnosis not present

## 2021-05-15 DIAGNOSIS — E282 Polycystic ovarian syndrome: Secondary | ICD-10-CM | POA: Diagnosis not present

## 2021-05-15 DIAGNOSIS — E8881 Metabolic syndrome: Secondary | ICD-10-CM | POA: Diagnosis not present

## 2021-08-09 ENCOUNTER — Other Ambulatory Visit: Payer: Self-pay | Admitting: Adult Health

## 2021-08-09 DIAGNOSIS — F50819 Binge eating disorder, unspecified: Secondary | ICD-10-CM

## 2021-08-09 DIAGNOSIS — F411 Generalized anxiety disorder: Secondary | ICD-10-CM

## 2021-08-09 DIAGNOSIS — F410 Panic disorder [episodic paroxysmal anxiety] without agoraphobia: Secondary | ICD-10-CM

## 2021-08-09 DIAGNOSIS — F5081 Binge eating disorder: Secondary | ICD-10-CM

## 2021-08-12 NOTE — Telephone Encounter (Signed)
Pt has an appt 3/30 °

## 2021-08-12 NOTE — Telephone Encounter (Signed)
Please call to schedule an appt. Last seen 7/13 with RTC in 4 weeks.  ?

## 2021-08-22 ENCOUNTER — Ambulatory Visit (INDEPENDENT_AMBULATORY_CARE_PROVIDER_SITE_OTHER): Payer: 59 | Admitting: Adult Health

## 2021-08-22 ENCOUNTER — Encounter: Payer: Self-pay | Admitting: Adult Health

## 2021-08-22 DIAGNOSIS — Z79899 Other long term (current) drug therapy: Secondary | ICD-10-CM | POA: Diagnosis not present

## 2021-08-22 DIAGNOSIS — G4700 Insomnia, unspecified: Secondary | ICD-10-CM | POA: Diagnosis not present

## 2021-08-22 DIAGNOSIS — R4586 Emotional lability: Secondary | ICD-10-CM

## 2021-08-22 DIAGNOSIS — F321 Major depressive disorder, single episode, moderate: Secondary | ICD-10-CM | POA: Diagnosis not present

## 2021-08-22 MED ORDER — FLUOXETINE HCL 40 MG PO CAPS: 40.0000 mg | ORAL_CAPSULE | Freq: Every day | ORAL | 5 refills | Status: DC

## 2021-08-22 MED ORDER — CLONAZEPAM 0.5 MG PO TABS: ORAL_TABLET | ORAL | 2 refills | Status: DC

## 2021-08-22 MED ORDER — CLOMIPRAMINE HCL 50 MG PO CAPS: ORAL_CAPSULE | ORAL | 5 refills | Status: DC

## 2021-08-22 NOTE — Progress Notes (Signed)
Kanesha Thelma BargeFrancis ?[id number] ?03/29/1991 ?31 y.o. ? ?Subjective:  ? ?Patient ID:  Laura Perez is a 31 y.o. (DOB 02/14/1991) female. ? ?Chief Complaint: No chief complaint on file. ? ? ? ?HPI ?Laura Crowichole Ghee presents to the office today for follow-up of ADHD, MDD, emotional lability, and physiological insomnia. ? ?Describes mood today as "ok". Pleasant. Denies tearfulness. Mood symptoms - reports anxiety - "more manageable". Denies depression or irritability. Denies recent panic attacks. Mood has been consistent. Stating "I'm doing good". Reporting some situational stressors. Mother fell and broke her ankle and was in the hospital for 30 days - recovered. Grandmother had a fall. Going through a house remodel. Stopped the Ozempic for a while due to pancreatitis. Enjoying her new dog - yorkie - "Sam". Stable interest and motivation. Taking medications as prescribed.  ?Energy levels stable. Active, does not have a regular exercise routine.  ?Enjoys some usual interests and activities. Dating. Has a boyfriend of 8 years. Lives with parents. Spending time with family and her best friends.  ?Appetite decreased. Weight loss 241 pounds - restarted Ozempic.  ?Sleeps better some nights than others. Averages 6 to 7 hours - napping during the day. ?Focus and concentration difficulties. Completing tasks. Managing aspects of household. Working at [employer] 20 hours a week. ?Denies SI or HI.  ?Denies AH or VH. ?Dr. Cori RazorMylan - therapist - not seeing currently - finished with therapy. ? ?Previous medication trials: Zoloft, lamictal, Celexa, Elavil ? ?Has not tried - Prozac, Effexor, lexapro, Clomiprimine, Abilify, Rexulti, Seroquel, Latuda, Buspar ? ? ?PHQ2-9   ? ?Flowsheet Row [facility] from [facility]/13/2016 [facility]  ?PHQ-2 Total Score 4  ?PHQ-9 Total Score 16  ? ?  ? ?Flowsheet Row ED from 04/23/2021 in [facility] ED from 12/06/2020 in [facility] ED from  [facility]  ?C-SSRS RISK CATEGORY No Risk No Risk No Risk  ? ?  ?  ? ?Review of Systems:  ?Review of Systems  ?Musculoskeletal:  Negative for gait problem.  ?Neurological:  Negative for tremors.  ?Psychiatric/Behavioral:    ?     Please refer to HPI  ? ?Medications: I have reviewed the patient's current medications. ? ?Current Outpatient Medications  ?Medication Sig Dispense Refill  ? beclomethasone (QVAR) 80 MCG/ACT inhaler Inhale 1 puff into the lungs daily as needed (wheezing).    ? clomiPRAMINE (ANAFRANIL) 50 MG capsule 1 capsule at bedtime 30 capsule 5  ? clonazePAM (KLONOPIN) 0.5 MG tablet TAKE ONE TABLET BY MOUTH TWICE A DAY 20 tablet 0  ? dextroamphetamine (DEXTROSTAT) 10 MG tablet Take 1 tablet (10 mg total) by mouth daily. 30 tablet 0  ? diclofenac (VOLTAREN) 75 MG EC tablet Take 1 tablet (75 mg total) by mouth 2 (two) times daily. (Patient not taking: No sig reported) 60 tablet 1  ? FLUoxetine (PROZAC) 40 MG capsule Take 1 capsule (40 mg total) by mouth at bedtime. 30 capsule 5  ? Ibuprofen-diphenhydrAMINE Cit (ADVIL PM PO) Take 2 tablets by mouth daily as needed (pain).    ? loratadine (CLARITIN) 10 MG tablet Take 1 tablet (10 mg total) by mouth daily. 15 tablet 0  ? metoCLOPramide (REGLAN) 10 MG tablet Take 1 tablet (10 mg total) by mouth every 6 (six) hours. 8 tablet 0  ? NIKKI 3-0.02 MG tablet Take 1 tablet by mouth daily.    ? ondansetron (ZOFRAN ODT) 4 MG disintegrating tablet Take one tab by mouth Q6hr  prn nausea (Dissolve under tongue) (Patient taking differently: Take 4 mg by mouth every 8 (eight) hours as needed for nausea or vomiting.) 12 tablet 0  ? OZEMPIC, 0.25 OR 0.5 MG/DOSE, 2 MG/1.5ML SOPN Inject 0.5 mg into the skin once a week. Friday    ? pantoprazole (PROTONIX) 20 MG tablet Take 1 tablet (20 mg total) by mouth daily. 20 tablet 0  ? VIBERZI 100 MG TABS Take 1 tablet by mouth 2 (two) times daily.    ? ?No current facility-administered  medications for this visit.  ? ? ?Medication Side Effects: None ? ?Allergies: No Known Allergies ? ?Past Medical History:  ?Diagnosis Date  ? ADHD   ? Anxiety   ? Asthma   ? Depression   ? GERD (gastroesophageal reflux disease)   ? IBS (irritable bowel syndrome)   ? PCOS (polycystic ovarian syndrome)   ? ? ?Past Medical History, Surgical history, Social history, and Family history were reviewed and updated as appropriate.  ? ?Please see review of systems for further details on the patient's review from today.  ? ?Objective:  ? ?Physical Exam:  ?There were no vitals taken for this visit. ? ?Physical Exam ?Constitutional:   ?   General: She is not in acute distress. ?Musculoskeletal:     ?   General: No deformity.  ?Neurological:  ?   Mental Status: She is alert and oriented to person, place, and time.  ?   Coordination: Coordination normal.  ?Psychiatric:     ?   Attention and Perception: Attention and perception normal. She does not perceive auditory or visual hallucinations.     ?   Mood and Affect: Mood normal. Mood is not anxious or depressed. Affect is not labile, blunt, angry or inappropriate.     ?   Speech: Speech normal.     ?   Behavior: Behavior normal.     ?   Thought Content: Thought content normal. Thought content is not paranoid or delusional. Thought content does not include homicidal or suicidal ideation. Thought content does not include homicidal or suicidal plan.     ?   Cognition and Memory: Cognition and memory normal.     ?   Judgment: Judgment normal.  ?   Comments: Insight intact  ? ? ?Lab Review:  ?   ?Component Value Date/Time  ? NA 138 12/06/2020 2312  ? NA 139 03/05/2017 1055  ? K 4.0 12/06/2020 2312  ? CL 103 12/06/2020 2312  ? CO2 25 12/06/2020 2312  ? GLUCOSE 84 12/06/2020 2312  ? BUN 13 12/06/2020 2312  ? BUN 14 03/05/2017 1055  ? CREATININE 0.88 12/06/2020 2312  ? CALCIUM 9.5 12/06/2020 2312  ? PROT 7.3 12/06/2020 2312  ? PROT 6.9 03/05/2017 1055  ? ALBUMIN 4.2 12/06/2020 2312  ?  ALBUMIN 4.2 03/05/2017 1055  ? AST 25 12/06/2020 2312  ? ALT 34 12/06/2020 2312  ? ALKPHOS 69 12/06/2020 2312  ? BILITOT 0.3 12/06/2020 2312  ? BILITOT 0.3 03/05/2017 1055  ? GFRNONAA >60 12/06/2020 2312  ? GFRAA 123 03/05/2017 1055  ? ? ?   ?Component Value Date/Time  ? WBC 10.2 12/06/2020 2312  ? RBC 4.75 12/06/2020 2312  ? HGB 13.4 12/06/2020 2312  ? HGB 12.5 05/15/2016 1156  ? HCT 41.1 12/06/2020 2312  ? HCT 38.4 05/15/2016 1156  ? PLT 291 12/06/2020 2312  ? MCV 86.5 12/06/2020 2312  ? MCV 85 05/15/2016 1156  ? MCH 28.2 12/06/2020 2312  ?  MCHC 32.6 12/06/2020 2312  ? RDW 13.7 12/06/2020 2312  ? RDW 14.1 05/15/2016 1156  ? LYMPHSABS 3.8 12/06/2020 2312  ? LYMPHSABS 2.9 05/15/2016 1156  ? MONOABS 0.6 12/06/2020 2312  ? EOSABS 0.2 12/06/2020 2312  ? EOSABS 0.1 05/15/2016 1156  ? BASOSABS 0.1 12/06/2020 2312  ? BASOSABS 0.1 05/15/2016 1156  ? ? ?No results found for: POCLITH, LITHIUM  ? ?No results found for: PHENYTOIN, PHENOBARB, VALPROATE, CBMZ  ? ?.res ?Assessment: Plan:   ? ?Plan: ? ?1. Clopimramine 50mg  at hs ?2. Prozac 40mg  daily ?3. Adderall 10mg  as needed - none needed today ?4. Clonazepam 0.5mg  - 2 at hs ? ?Clomiramine level - lab slip given ? ?6 months ? ?Discussed potential benefits, risks, and side effects of stimulants with patient to include increased heart rate, palpitations, insomnia, increased anxiety, increased irritability, or decreased appetite.  Instructed patient to contact office if experiencing any significant tolerability issues. ? ?Discussed potential benefits, risk, and side effects of benzodiazepines to include potential risk of tolerance and dependence, as well as possible drowsiness.  Advised patient not to drive if experiencing drowsiness and to take lowest possible effective dose to minimize risk of dependence and tolerance. ? ?Patient advised to contact office with any questions, adverse effects, or acute worsening in signs and symptoms. ? ? ?Diagnoses and all orders for this  visit: ? ?High risk medication use ? ?Moderate major depression, single episode (HCC) ? ?Physiological insomnia ? ?Emotional lability ? ?  ? ?Please see After Visit Summary for patient specific instructions. ? ?No fut

## 2021-09-02 IMAGING — DX DG CHEST 1V PORT
1 series · 1 of 1 positions shown · non-contrast
Comparison: 10/12/2017

CLINICAL DATA: Shortness of breath

EXAM:
PORTABLE CHEST 1 VIEW

[chest]
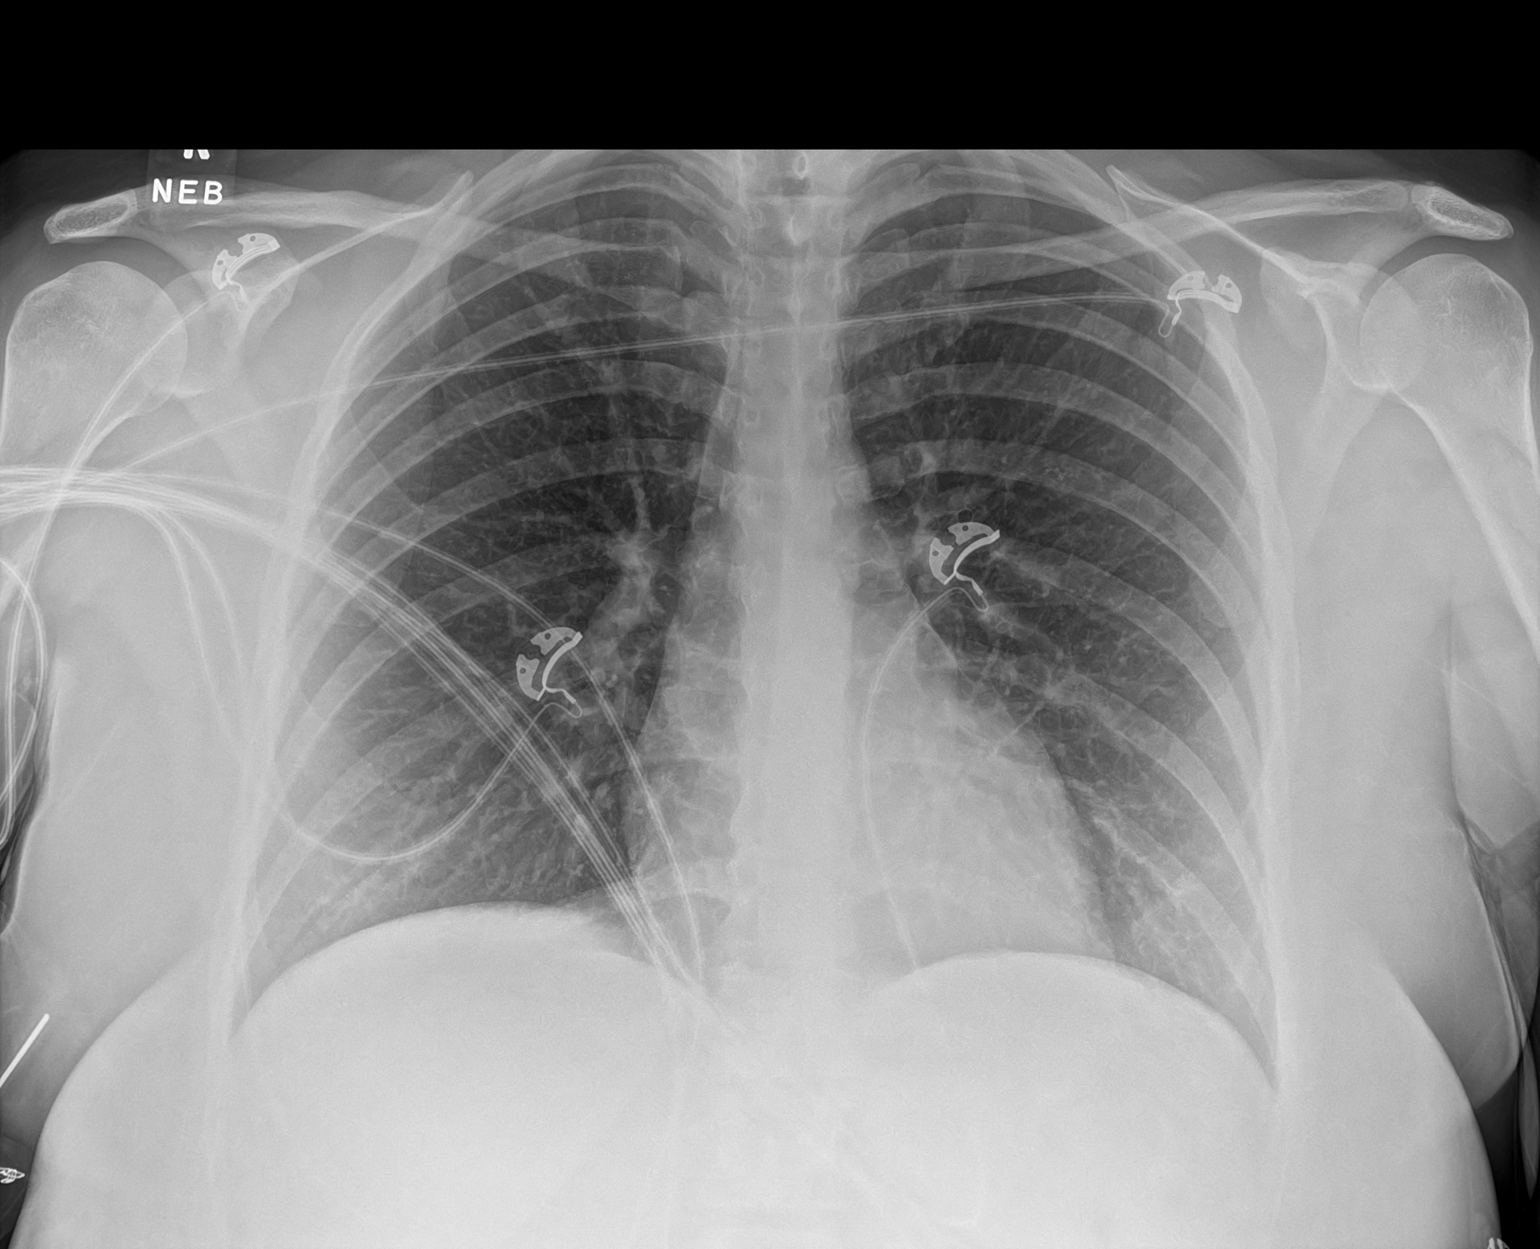

[1 of 1 positions shown; findings below may reference images not displayed]

FINDINGS: The heart size and mediastinal contours are within normal limits.
Both lungs are clear. The visualized skeletal structures are
unremarkable.
IMPRESSION: No active disease.

## 2021-09-03 IMAGING — CT CT ABD-PELV W/ CM
2 of 4 series · 16 of 46 positions shown, 18 images · IV contrast (omnipaque)
Comparison: None.

CLINICAL DATA: Right lower quadrant abdominal pain, vomiting,
dehydration

EXAM:
CT ABDOMEN AND PELVIS WITH CONTRAST
TECHNIQUE: Multidetector CT imaging of the abdomen and pelvis was performed
using the standard protocol following bolus administration of
intravenous contrast.
CONTRAST:  100mL OMNIPAQUE IOHEXOL 300 MG/ML  SOLN

[Series 2: abd pel w · axial · 0.82mm/px · z∈[+722,+1127]mm · 13 of 89 slices shown, 15 images]
[im 4/89  soft-tissue]
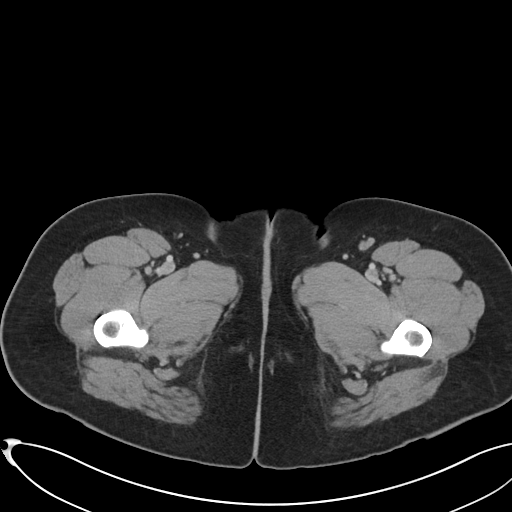
[im 4/89  bone]
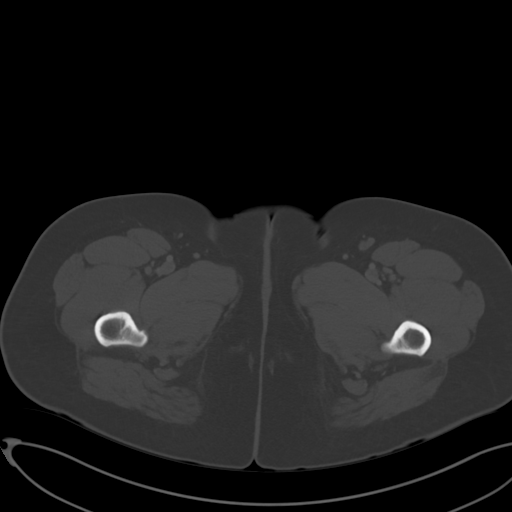
[im 12/89  soft-tissue]
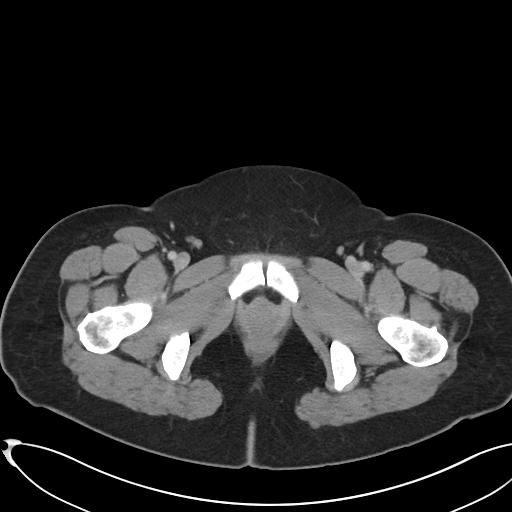
[im 19/89  soft-tissue]
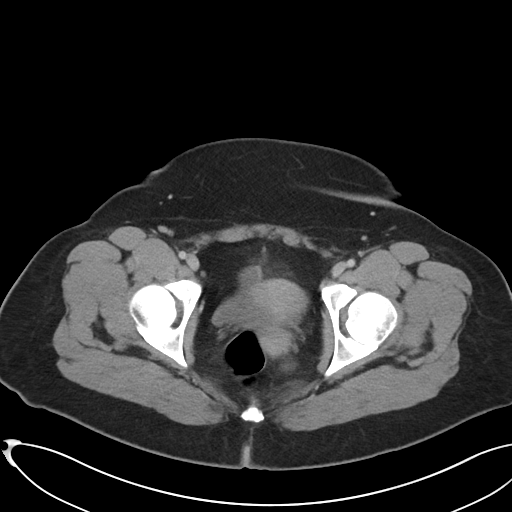
[im 26/89  soft-tissue]
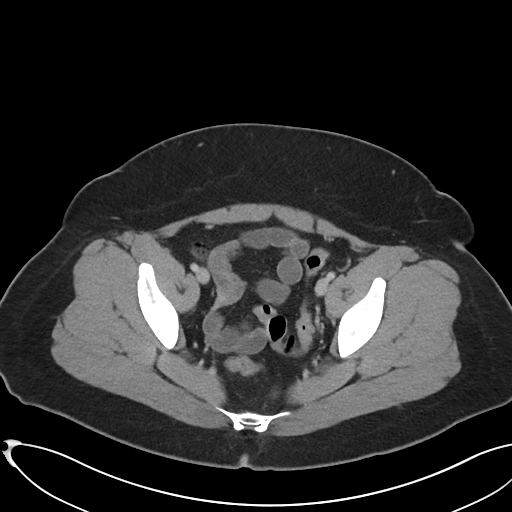
[im 30/89  soft-tissue]
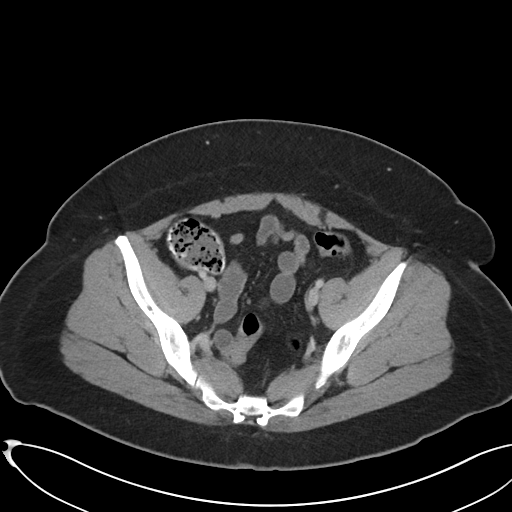
[im 37/89  soft-tissue]
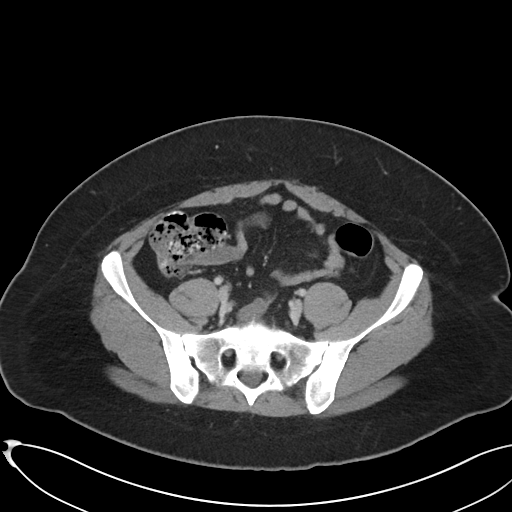
[im 45/89  soft-tissue]
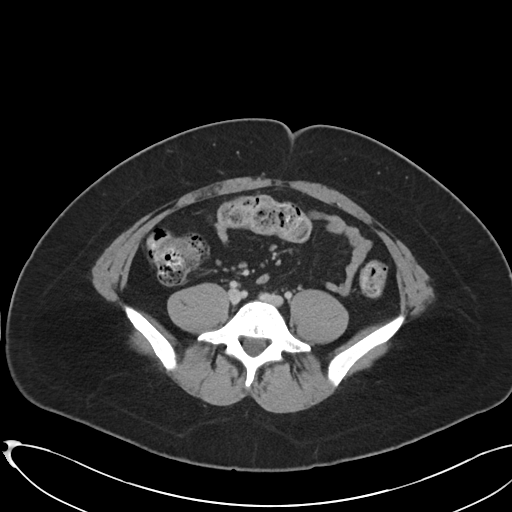
[im 52/89  soft-tissue]
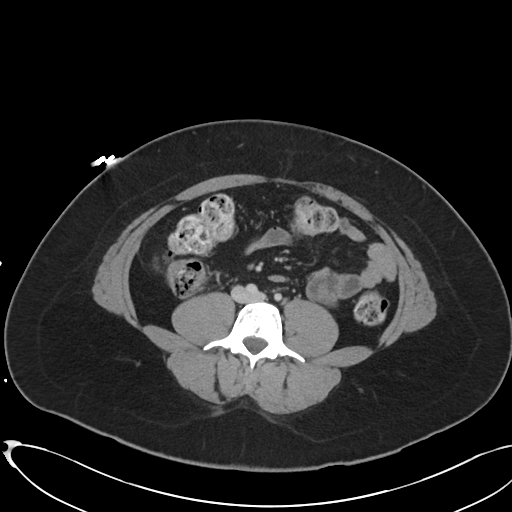
[im 59/89  soft-tissue]
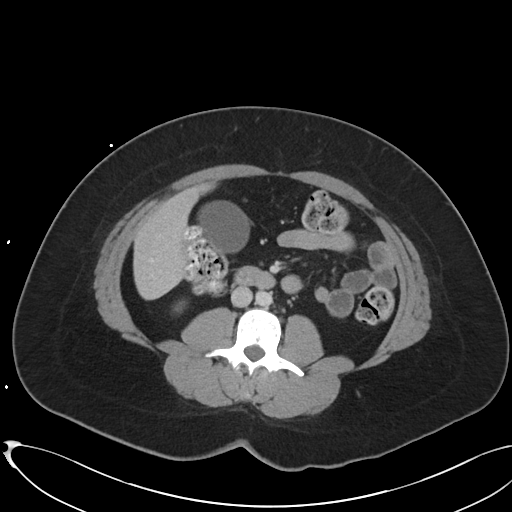
[im 59/89  bone]
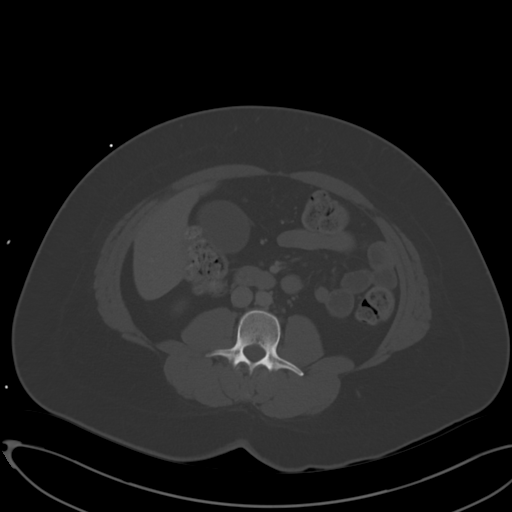
[im 63/89  soft-tissue]
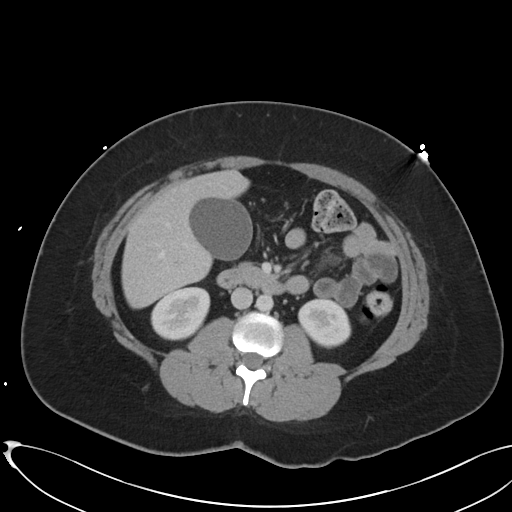
[im 70/89  soft-tissue]
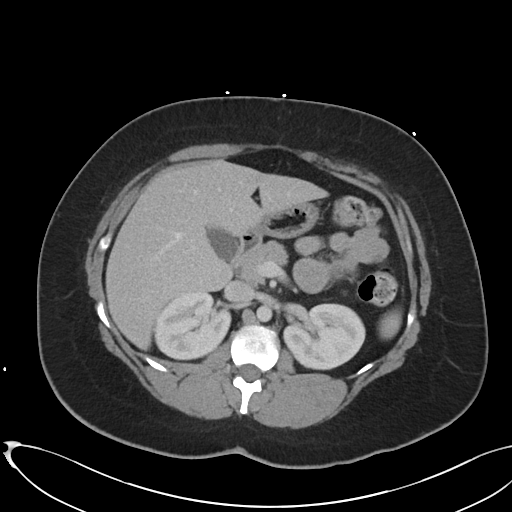
[im 78/89  soft-tissue]
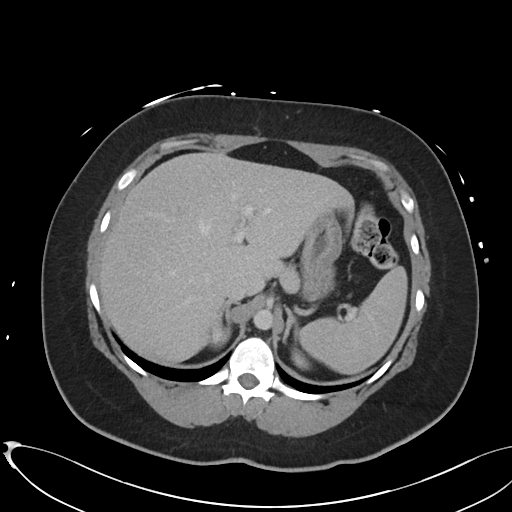
[im 85/89  soft-tissue]
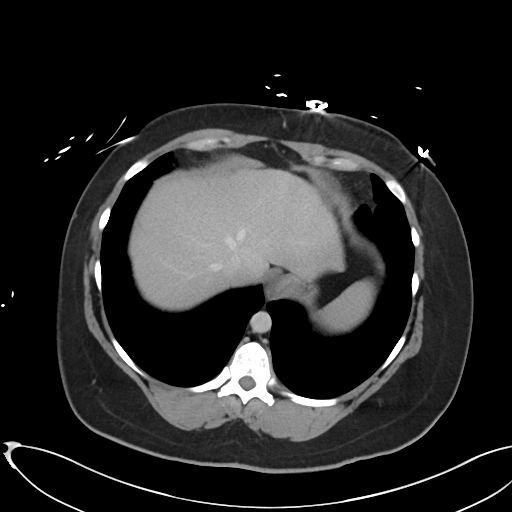

[Series 5: coronal · coronal · 0.82mm/px · 3 of 102 slices shown]
[im 34/102  soft-tissue]
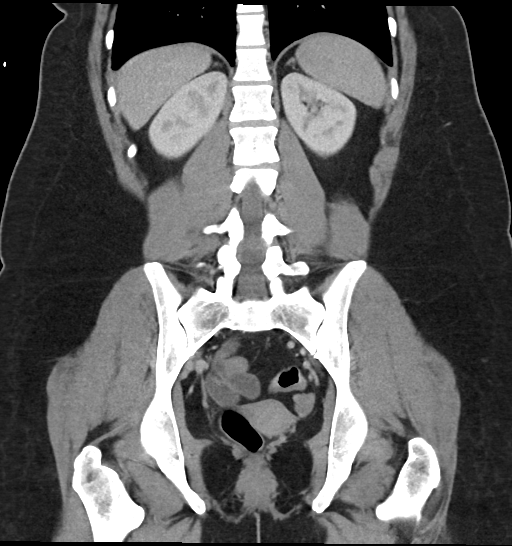
[im 45/102  soft-tissue]
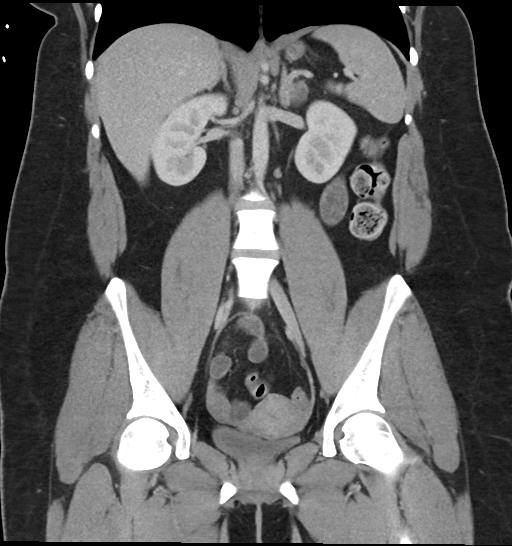
[im 57/102  soft-tissue]
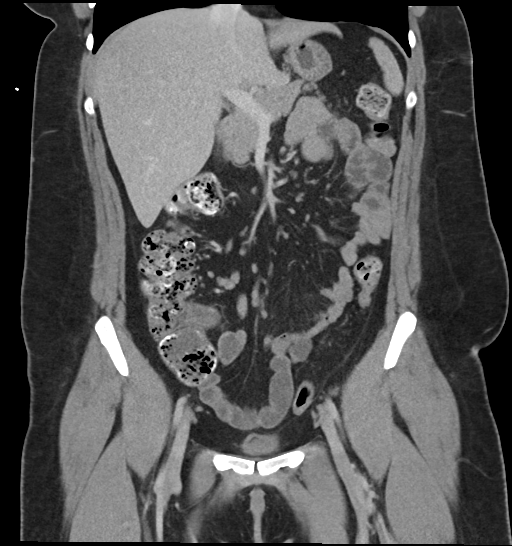

[16 of 46 positions shown; findings below may reference images not displayed]

FINDINGS: Lower chest: Lung bases are clear. Normal heart size. No pericardial
effusion.

Hepatobiliary: No worrisome focal liver lesions. Smooth liver
surface contour. Normal hepatic attenuation. Normal gallbladder and
biliary tree without pericholecystic fluid or inflammation or
visible calcified gallstones.

Pancreas: No pancreatic ductal dilatation or surrounding
inflammatory changes.

Spleen: Normal in size. No concerning splenic lesions.

Adrenals/Urinary Tract: Normal adrenals. Kidneys are normally
located with symmetric enhancement. No suspicious renal lesion,
urolithiasis or hydronephrosis. Urinary bladder is largely
decompressed at the time of exam and therefore poorly evaluated by
CT imaging. Mild bladder wall thickening may be related to
underdistention.

Stomach/Bowel: Distal esophagus, stomach and duodenum are
unremarkable. Normal duodenal sweep across the midline. No
significant large or small bowel thickening or dilatation. No
evidence of obstruction. Normal, noninflamed appendix coursing in a
retrocecal position along the right pelvic sidewall.

Vascular/Lymphatic: No significant vascular findings are present. No
enlarged abdominal or pelvic lymph nodes.

Reproductive: Anteverted uterus. No concerning adnexal mass or
lesion.

Other: No abdominopelvic free fluid or free gas. No bowel containing
hernias.

Musculoskeletal: No acute osseous abnormality or suspicious osseous
lesion.
IMPRESSION: No CT evidence of acute appendicitis with a normal, noninflamed
appendix coursing along the right pelvic sidewall.

Mild bladder wall thickening, may be related to underdistention
though could correlate with urinalysis.

No other acute or conspicuous CT abnormality to provide cause for
patient's symptoms.

## 2021-10-18 DIAGNOSIS — R0981 Nasal congestion: Secondary | ICD-10-CM | POA: Diagnosis not present

## 2021-10-18 DIAGNOSIS — U071 COVID-19: Secondary | ICD-10-CM | POA: Diagnosis not present

## 2021-10-18 DIAGNOSIS — J029 Acute pharyngitis, unspecified: Secondary | ICD-10-CM | POA: Diagnosis not present

## 2021-10-31 DIAGNOSIS — E282 Polycystic ovarian syndrome: Secondary | ICD-10-CM | POA: Diagnosis not present

## 2021-10-31 DIAGNOSIS — N911 Secondary amenorrhea: Secondary | ICD-10-CM | POA: Diagnosis not present

## 2021-10-31 DIAGNOSIS — K580 Irritable bowel syndrome with diarrhea: Secondary | ICD-10-CM | POA: Diagnosis not present

## 2021-11-21 DIAGNOSIS — R0681 Apnea, not elsewhere classified: Secondary | ICD-10-CM | POA: Diagnosis not present

## 2021-11-21 DIAGNOSIS — G43909 Migraine, unspecified, not intractable, without status migrainosus: Secondary | ICD-10-CM | POA: Diagnosis not present

## 2021-11-21 DIAGNOSIS — R0683 Snoring: Secondary | ICD-10-CM | POA: Diagnosis not present

## 2021-12-01 DIAGNOSIS — R5383 Other fatigue: Secondary | ICD-10-CM | POA: Diagnosis not present

## 2021-12-01 DIAGNOSIS — R197 Diarrhea, unspecified: Secondary | ICD-10-CM | POA: Diagnosis not present

## 2021-12-18 ENCOUNTER — Other Ambulatory Visit: Payer: Self-pay | Admitting: Adult Health

## 2021-12-18 DIAGNOSIS — F321 Major depressive disorder, single episode, moderate: Secondary | ICD-10-CM

## 2021-12-19 DIAGNOSIS — G4733 Obstructive sleep apnea (adult) (pediatric): Secondary | ICD-10-CM | POA: Diagnosis not present

## 2022-01-02 DIAGNOSIS — Z1389 Encounter for screening for other disorder: Secondary | ICD-10-CM | POA: Diagnosis not present

## 2022-01-02 DIAGNOSIS — G4733 Obstructive sleep apnea (adult) (pediatric): Secondary | ICD-10-CM | POA: Diagnosis not present

## 2022-01-02 DIAGNOSIS — E559 Vitamin D deficiency, unspecified: Secondary | ICD-10-CM | POA: Diagnosis not present

## 2022-01-02 DIAGNOSIS — E282 Polycystic ovarian syndrome: Secondary | ICD-10-CM | POA: Diagnosis not present

## 2022-01-02 DIAGNOSIS — E8881 Metabolic syndrome: Secondary | ICD-10-CM | POA: Diagnosis not present

## 2022-01-02 DIAGNOSIS — F900 Attention-deficit hyperactivity disorder, predominantly inattentive type: Secondary | ICD-10-CM | POA: Diagnosis not present

## 2022-01-07 DIAGNOSIS — G4733 Obstructive sleep apnea (adult) (pediatric): Secondary | ICD-10-CM | POA: Diagnosis not present

## 2022-01-09 DIAGNOSIS — G43111 Migraine with aura, intractable, with status migrainosus: Secondary | ICD-10-CM | POA: Diagnosis not present

## 2022-01-17 IMAGING — DX DG CHEST 2V
2 series · 2 of 2 positions shown · non-contrast
Comparison: 12/06/2020

CLINICAL DATA: Shortness of breath

EXAM:
CHEST - 2 VIEW

[chest pa]
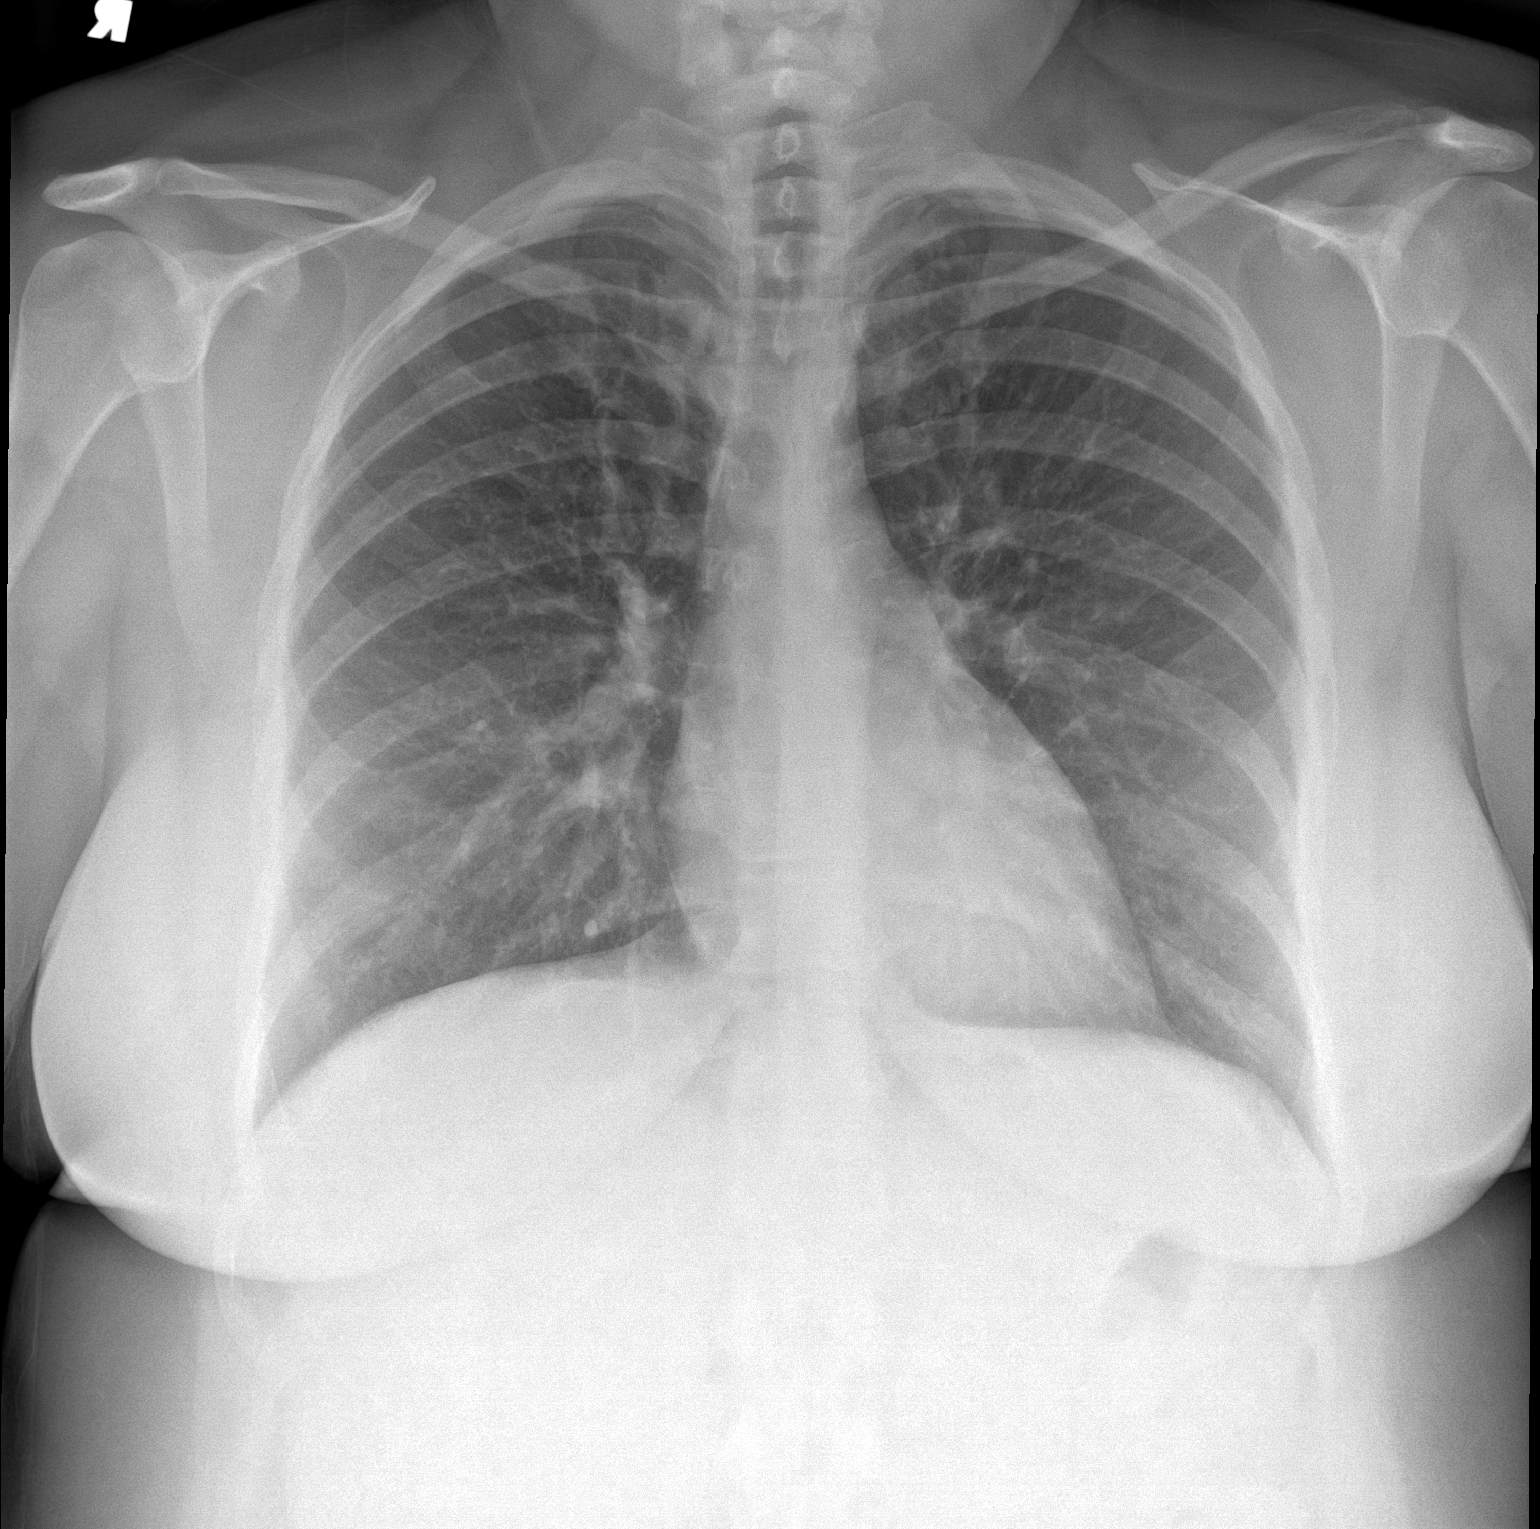

[chest lat]
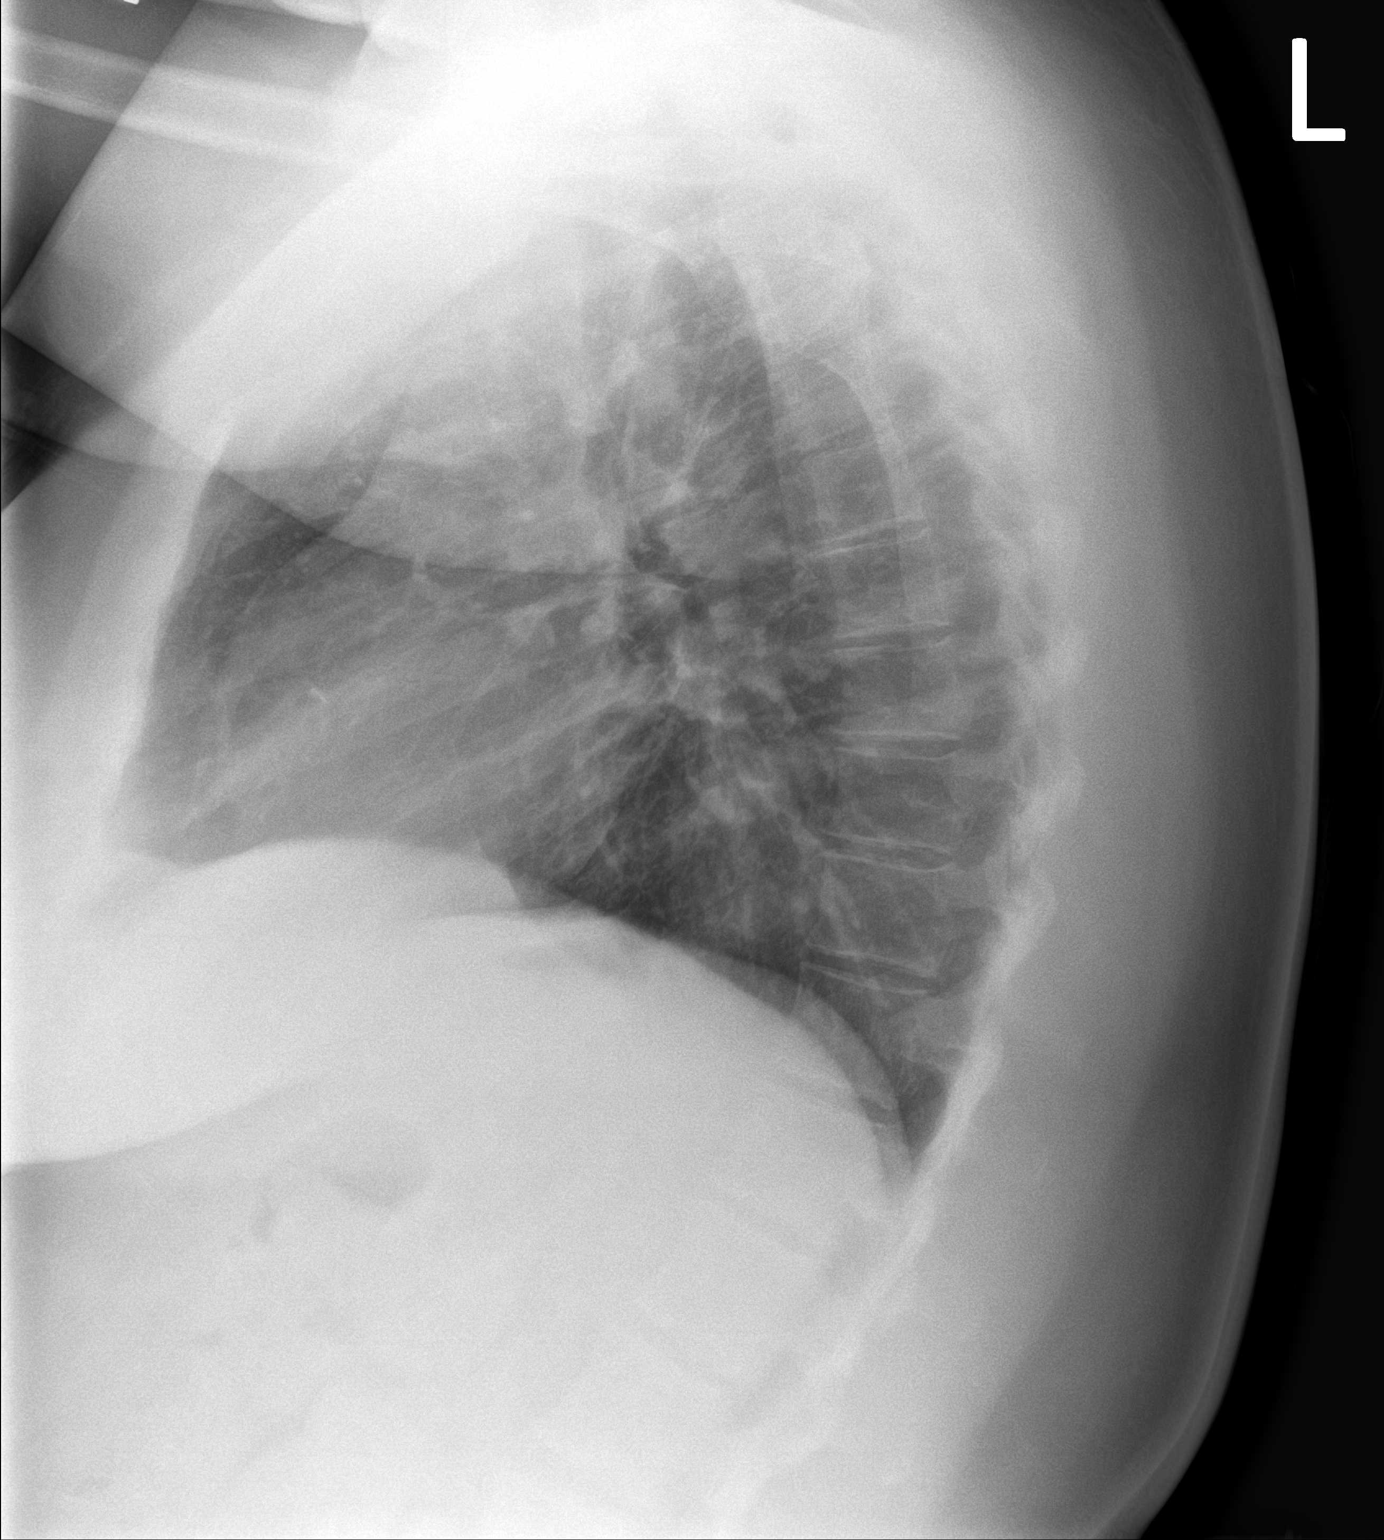

[2 of 2 positions shown; findings below may reference images not displayed]

FINDINGS: The heart size and mediastinal contours are within normal limits.
Both lungs are clear. The visualized skeletal structures are
unremarkable.
IMPRESSION: No active cardiopulmonary disease.

## 2022-01-23 DIAGNOSIS — R632 Polyphagia: Secondary | ICD-10-CM | POA: Diagnosis not present

## 2022-01-23 DIAGNOSIS — E8881 Metabolic syndrome: Secondary | ICD-10-CM | POA: Diagnosis not present

## 2022-01-23 DIAGNOSIS — E559 Vitamin D deficiency, unspecified: Secondary | ICD-10-CM | POA: Diagnosis not present

## 2022-01-23 DIAGNOSIS — E669 Obesity, unspecified: Secondary | ICD-10-CM | POA: Diagnosis not present

## 2022-01-23 DIAGNOSIS — E282 Polycystic ovarian syndrome: Secondary | ICD-10-CM | POA: Diagnosis not present

## 2022-02-07 DIAGNOSIS — G4733 Obstructive sleep apnea (adult) (pediatric): Secondary | ICD-10-CM | POA: Diagnosis not present

## 2022-02-12 DIAGNOSIS — K580 Irritable bowel syndrome with diarrhea: Secondary | ICD-10-CM | POA: Diagnosis not present

## 2022-02-12 DIAGNOSIS — E282 Polycystic ovarian syndrome: Secondary | ICD-10-CM | POA: Diagnosis not present

## 2022-02-12 DIAGNOSIS — G43009 Migraine without aura, not intractable, without status migrainosus: Secondary | ICD-10-CM | POA: Diagnosis not present

## 2022-02-12 DIAGNOSIS — F419 Anxiety disorder, unspecified: Secondary | ICD-10-CM | POA: Diagnosis not present

## 2022-02-18 DIAGNOSIS — F900 Attention-deficit hyperactivity disorder, predominantly inattentive type: Secondary | ICD-10-CM | POA: Diagnosis not present

## 2022-02-18 DIAGNOSIS — F411 Generalized anxiety disorder: Secondary | ICD-10-CM | POA: Diagnosis not present

## 2022-02-19 ENCOUNTER — Encounter: Payer: Self-pay | Admitting: Adult Health

## 2022-02-19 ENCOUNTER — Ambulatory Visit: Payer: BC Managed Care – PPO | Admitting: Adult Health

## 2022-02-19 DIAGNOSIS — F321 Major depressive disorder, single episode, moderate: Secondary | ICD-10-CM

## 2022-02-19 DIAGNOSIS — G4700 Insomnia, unspecified: Secondary | ICD-10-CM | POA: Diagnosis not present

## 2022-02-19 DIAGNOSIS — F411 Generalized anxiety disorder: Secondary | ICD-10-CM

## 2022-02-19 DIAGNOSIS — F902 Attention-deficit hyperactivity disorder, combined type: Secondary | ICD-10-CM | POA: Diagnosis not present

## 2022-02-19 DIAGNOSIS — F410 Panic disorder [episodic paroxysmal anxiety] without agoraphobia: Secondary | ICD-10-CM

## 2022-02-19 DIAGNOSIS — F322 Major depressive disorder, single episode, severe without psychotic features: Secondary | ICD-10-CM

## 2022-02-19 DIAGNOSIS — F5081 Binge eating disorder: Secondary | ICD-10-CM

## 2022-02-19 DIAGNOSIS — Z79899 Other long term (current) drug therapy: Secondary | ICD-10-CM

## 2022-02-19 MED ORDER — LISDEXAMFETAMINE DIMESYLATE 70 MG PO CAPS: 70.0000 mg | ORAL_CAPSULE | Freq: Every day | ORAL | 0 refills | Status: DC

## 2022-02-19 MED ORDER — CLONAZEPAM 0.5 MG PO TABS: 0.5000 mg | ORAL_TABLET | Freq: Two times a day (BID) | ORAL | 2 refills | Status: DC

## 2022-02-19 MED ORDER — CLOMIPRAMINE HCL 50 MG PO CAPS: ORAL_CAPSULE | ORAL | 5 refills | Status: DC

## 2022-02-19 MED ORDER — FLUOXETINE HCL 40 MG PO CAPS: 40.0000 mg | ORAL_CAPSULE | Freq: Every day | ORAL | 5 refills | Status: DC

## 2022-02-19 MED ORDER — PROPRANOLOL HCL 10 MG PO TABS: 10.0000 mg | ORAL_TABLET | Freq: Three times a day (TID) | ORAL | 2 refills | Status: DC | PRN

## 2022-02-19 NOTE — Progress Notes (Signed)
Laura Perez [id number] 06/01/1990 31 y.o.  Subjective:   Patient ID:  Laura Perez is a 31 y.o. (DOB Jun 21, 1990) female.  Chief Complaint: No chief complaint on file.   HPI Laura Perez presents to the office today for follow-up of ADHD, MDD, BED, panic disorder, emotional lability, and physiological insomnia.  Describes mood today as "ok". Pleasant. Denies tearfulness. Mood symptoms - reports increased anxiety, depression and irritability. Reports panic attacks. Mood has been variable. Stating "I'm not doing good". Reporting increased situational stressors. Stating "I'm under a lot of stress". Grandmother fell and had a stroke - discussing end of life care. Mother in kidney failure - at [employer] a lot - daughter donating a kidney. Reports an IBS flare up. Has started seeing a therapist twice a week - Duanne Limerick . Varying interest and motivation. Taking medications as prescribed.  Energy levels deceased. Active, does not have a regular exercise routine.  Enjoys some usual interests and activities. Dating. Has a boyfriend. Lives with parents. Spending time with family and her best friends.  Appetite decreased. Weight gain 241 to 250 pounds - stopped Ozempic.  Sleeps better some nights than others. Averages 5 to 6 hours - using CPAP machine - less napping during the day. Focus and concentration difficulties. Completing tasks. Managing aspects of household. Working at [employer] 20 hours a week. Denies SI or HI.  Denies AH or VH. Denies self harm. Denies substance use.  Previous medication trials: Zoloft, lamictal, Celexa, Elavil  Has not tried - Prozac, Effexor, lexapro, Clomiprimine, Abilify, Rexulti, Seroquel, Latuda, Buspar   PHQ2-9    Flowsheet Row Office Visit from 05/13/2016 in [facility]  PHQ-2 Total Score 4  PHQ-9 Total Score 16      Flowsheet Row ED from 04/23/2021 in [facility] ED from 12/06/2020 in [facility] ED from 09/30/2020 in [facility]  C-SSRS RISK CATEGORY No Risk No Risk No Risk        Review of Systems:  Review of Systems  Musculoskeletal:  Negative for gait problem.  Neurological:  Negative for tremors.  Psychiatric/Behavioral:         Please refer to HPI    Medications: I have reviewed the patient's current medications.  Current Outpatient Medications  Medication Sig Dispense Refill   lisdexamfetamine (VYVANSE) 70 MG capsule Take 1 capsule (70 mg total) by mouth daily. 30 capsule 0   propranolol (INDERAL) 10 MG tablet Take 1 tablet (10 mg total) by mouth 3 (three) times daily as needed. 90 tablet 2   beclomethasone (QVAR) 80 MCG/ACT inhaler Inhale 1 puff into the lungs daily as needed (wheezing).     clomiPRAMINE (ANAFRANIL) 50 MG capsule 1 capsule at bedtime 30 capsule 5   clonazePAM (KLONOPIN) 0.5 MG tablet Take 1 tablet (0.5 mg total) by mouth 2 (two) times daily. 60 tablet 2   dextroamphetamine (DEXTROSTAT) 10 MG tablet Take 1 tablet (10 mg total) by mouth daily. 30 tablet 0   diclofenac (VOLTAREN) 75 MG EC tablet Take 1 tablet (75 mg total) by mouth 2 (two) times daily. (Patient not taking: No sig reported) 60 tablet 1   FLUoxetine (PROZAC) 40 MG capsule Take 1 capsule (40 mg total) by mouth at bedtime. 30 capsule 5   Ibuprofen-diphenhydrAMINE Cit (ADVIL PM PO) Take 2 tablets by mouth daily as needed (pain).     loratadine (CLARITIN) 10 MG tablet Take 1 tablet (10 mg total) by mouth daily. 15  tablet 0   metoCLOPramide (REGLAN) 10 MG tablet Take 1 tablet (10 mg total) by mouth every 6 (six) hours. 8 tablet 0   NIKKI 3-0.02 MG tablet Take 1 tablet by mouth daily.     ondansetron (ZOFRAN ODT) 4 MG disintegrating tablet Take one tab by mouth Q6hr prn nausea (Dissolve under tongue) (Patient taking differently: Take 4 mg by mouth every 8 (eight) hours as needed for nausea or vomiting.) 12 tablet 0   OZEMPIC, 0.25 OR  0.5 MG/DOSE, 2 MG/1.5ML SOPN Inject 0.5 mg into the skin once a week. Friday     pantoprazole (PROTONIX) 20 MG tablet Take 1 tablet (20 mg total) by mouth daily. 20 tablet 0   VIBERZI 100 MG TABS Take 1 tablet by mouth 2 (two) times daily.     No current facility-administered medications for this visit.    Medication Side Effects: None  Allergies: No Known Allergies  Past Medical History:  Diagnosis Date   ADHD    Anxiety    Asthma    Depression    GERD (gastroesophageal reflux disease)    IBS (irritable bowel syndrome)    PCOS (polycystic ovarian syndrome)     Past Medical History, Surgical history, Social history, and Family history were reviewed and updated as appropriate.   Please see review of systems for further details on the patient's review from today.   Objective:   Physical Exam:  There were no vitals taken for this visit.  Physical Exam Constitutional:      General: She is not in acute distress. Musculoskeletal:        General: No deformity.  Neurological:     Mental Status: She is alert and oriented to person, place, and time.     Coordination: Coordination normal.  Psychiatric:        Attention and Perception: Attention and perception normal. She does not perceive auditory or visual hallucinations.        Mood and Affect: Mood normal. Mood is not anxious or depressed. Affect is not labile, blunt, angry or inappropriate.        Speech: Speech normal.        Behavior: Behavior normal.        Thought Content: Thought content normal. Thought content is not paranoid or delusional. Thought content does not include homicidal or suicidal ideation. Thought content does not include homicidal or suicidal plan.        Cognition and Memory: Cognition and memory normal.        Judgment: Judgment normal.     Comments: Insight intact     Lab Review:     Component Value Date/Time   NA 138 12/06/2020 2312   NA 139 03/05/2017 1055   K 4.0 12/06/2020 2312   CL 103  12/06/2020 2312   CO2 25 12/06/2020 2312   GLUCOSE 84 12/06/2020 2312   BUN 13 12/06/2020 2312   BUN 14 03/05/2017 1055   CREATININE 0.88 12/06/2020 2312   CALCIUM 9.5 12/06/2020 2312   PROT 7.3 12/06/2020 2312   PROT 6.9 03/05/2017 1055   ALBUMIN 4.2 12/06/2020 2312   ALBUMIN 4.2 03/05/2017 1055   AST 25 12/06/2020 2312   ALT 34 12/06/2020 2312   ALKPHOS 69 12/06/2020 2312   BILITOT 0.3 12/06/2020 2312   BILITOT 0.3 03/05/2017 1055   GFRNONAA >60 12/06/2020 2312   GFRAA 123 03/05/2017 1055       Component Value Date/Time   WBC 10.2 12/06/2020 2312  RBC 4.75 12/06/2020 2312   HGB 13.4 12/06/2020 2312   HGB 12.5 05/15/2016 1156   HCT 41.1 12/06/2020 2312   HCT 38.4 05/15/2016 1156   PLT 291 12/06/2020 2312   MCV 86.5 12/06/2020 2312   MCV 85 05/15/2016 1156   MCH 28.2 12/06/2020 2312   MCHC 32.6 12/06/2020 2312   RDW 13.7 12/06/2020 2312   RDW 14.1 05/15/2016 1156   LYMPHSABS 3.8 12/06/2020 2312   LYMPHSABS 2.9 05/15/2016 1156   MONOABS 0.6 12/06/2020 2312   EOSABS 0.2 12/06/2020 2312   EOSABS 0.1 05/15/2016 1156   BASOSABS 0.1 12/06/2020 2312   BASOSABS 0.1 05/15/2016 1156    No results found for: "POCLITH", "LITHIUM"   No results found for: "PHENYTOIN", "PHENOBARB", "VALPROATE", "CBMZ"   .res Assessment: Plan:    Plan:  1. Clopimramine 50mg  at hs 2. Prozac 40mg  daily 3. Add Propranolol 10mg  TID 4. Clonazepam 0.5mg  - 2 at hs 5. Vyvanse 70mg  every morning   Clomiramine level - lab slip given   4 weeks  Discussed potential benefits, risks, and side effects of stimulants with patient to include increased heart rate, palpitations, insomnia, increased anxiety, increased irritability, or decreased appetite.  Instructed patient to contact office if experiencing any significant tolerability issues.  Discussed potential benefits, risk, and side effects of benzodiazepines to include potential risk of tolerance and dependence, as well as possible drowsiness.   Advised patient not to drive if experiencing drowsiness and to take lowest possible effective dose to minimize risk of dependence and tolerance.  Patient advised to contact office with any questions, adverse effects, or acute worsening in signs and symptoms.  Diagnoses and all orders for this visit:  Physiological insomnia  Attention deficit hyperactivity disorder (ADHD), combined type, moderate -     lisdexamfetamine (VYVANSE) 70 MG capsule; Take 1 capsule (70 mg total) by mouth daily.  Moderate major depression, single episode (HCC) -     clomiPRAMINE (ANAFRANIL) 50 MG capsule; 1 capsule at bedtime -     clonazePAM (KLONOPIN) 0.5 MG tablet; Take 1 tablet (0.5 mg total) by mouth 2 (two) times daily. -     FLUoxetine (PROZAC) 40 MG capsule; Take 1 capsule (40 mg total) by mouth at bedtime.  Panic disorder -     propranolol (INDERAL) 10 MG tablet; Take 1 tablet (10 mg total) by mouth 3 (three) times daily as needed.  Generalized anxiety disorder -     propranolol (INDERAL) 10 MG tablet; Take 1 tablet (10 mg total) by mouth 3 (three) times daily as needed.  Binge eating disorder  Severe major depression, single episode, without psychotic features ([place])  High risk medication use -     Clomipramine     Please see After Visit Summary for patient specific instructions.  No future appointments.   Orders Placed This Encounter  Procedures   Clomipramine    -------------------------------

## 2022-02-20 ENCOUNTER — Telehealth: Payer: Self-pay

## 2022-02-20 DIAGNOSIS — F411 Generalized anxiety disorder: Secondary | ICD-10-CM | POA: Diagnosis not present

## 2022-02-20 DIAGNOSIS — E8881 Metabolic syndrome: Secondary | ICD-10-CM | POA: Diagnosis not present

## 2022-02-20 DIAGNOSIS — R632 Polyphagia: Secondary | ICD-10-CM | POA: Diagnosis not present

## 2022-02-20 DIAGNOSIS — F900 Attention-deficit hyperactivity disorder, predominantly inattentive type: Secondary | ICD-10-CM | POA: Diagnosis not present

## 2022-02-20 DIAGNOSIS — E282 Polycystic ovarian syndrome: Secondary | ICD-10-CM | POA: Diagnosis not present

## 2022-02-20 DIAGNOSIS — E559 Vitamin D deficiency, unspecified: Secondary | ICD-10-CM | POA: Diagnosis not present

## 2022-02-20 NOTE — Telephone Encounter (Addendum)
Prior Approval initiated Lisdexamfetamine Dimesylate 70MG  capsules #30 [employer]  Approval received  Effective:  02/20/22 - 02/19/23

## 2022-02-25 DIAGNOSIS — F411 Generalized anxiety disorder: Secondary | ICD-10-CM | POA: Diagnosis not present

## 2022-02-25 DIAGNOSIS — F900 Attention-deficit hyperactivity disorder, predominantly inattentive type: Secondary | ICD-10-CM | POA: Diagnosis not present

## 2022-02-27 DIAGNOSIS — F900 Attention-deficit hyperactivity disorder, predominantly inattentive type: Secondary | ICD-10-CM | POA: Diagnosis not present

## 2022-02-27 DIAGNOSIS — F411 Generalized anxiety disorder: Secondary | ICD-10-CM | POA: Diagnosis not present

## 2022-03-04 DIAGNOSIS — F900 Attention-deficit hyperactivity disorder, predominantly inattentive type: Secondary | ICD-10-CM | POA: Diagnosis not present

## 2022-03-04 DIAGNOSIS — F411 Generalized anxiety disorder: Secondary | ICD-10-CM | POA: Diagnosis not present

## 2022-03-06 DIAGNOSIS — F900 Attention-deficit hyperactivity disorder, predominantly inattentive type: Secondary | ICD-10-CM | POA: Diagnosis not present

## 2022-03-06 DIAGNOSIS — F411 Generalized anxiety disorder: Secondary | ICD-10-CM | POA: Diagnosis not present

## 2022-03-09 DIAGNOSIS — G4733 Obstructive sleep apnea (adult) (pediatric): Secondary | ICD-10-CM | POA: Diagnosis not present

## 2022-03-11 DIAGNOSIS — F411 Generalized anxiety disorder: Secondary | ICD-10-CM | POA: Diagnosis not present

## 2022-03-11 DIAGNOSIS — F900 Attention-deficit hyperactivity disorder, predominantly inattentive type: Secondary | ICD-10-CM | POA: Diagnosis not present

## 2022-03-13 DIAGNOSIS — F411 Generalized anxiety disorder: Secondary | ICD-10-CM | POA: Diagnosis not present

## 2022-03-13 DIAGNOSIS — F900 Attention-deficit hyperactivity disorder, predominantly inattentive type: Secondary | ICD-10-CM | POA: Diagnosis not present

## 2022-03-18 DIAGNOSIS — F900 Attention-deficit hyperactivity disorder, predominantly inattentive type: Secondary | ICD-10-CM | POA: Diagnosis not present

## 2022-03-18 DIAGNOSIS — F411 Generalized anxiety disorder: Secondary | ICD-10-CM | POA: Diagnosis not present

## 2022-03-20 DIAGNOSIS — F411 Generalized anxiety disorder: Secondary | ICD-10-CM | POA: Diagnosis not present

## 2022-03-20 DIAGNOSIS — F900 Attention-deficit hyperactivity disorder, predominantly inattentive type: Secondary | ICD-10-CM | POA: Diagnosis not present

## 2022-03-25 ENCOUNTER — Ambulatory Visit: Payer: BC Managed Care – PPO | Admitting: Adult Health

## 2022-03-25 ENCOUNTER — Encounter: Payer: Self-pay | Admitting: Adult Health

## 2022-03-25 DIAGNOSIS — E282 Polycystic ovarian syndrome: Secondary | ICD-10-CM | POA: Diagnosis not present

## 2022-03-25 DIAGNOSIS — F900 Attention-deficit hyperactivity disorder, predominantly inattentive type: Secondary | ICD-10-CM | POA: Diagnosis not present

## 2022-03-25 DIAGNOSIS — E88819 Insulin resistance, unspecified: Secondary | ICD-10-CM | POA: Diagnosis not present

## 2022-03-25 DIAGNOSIS — F410 Panic disorder [episodic paroxysmal anxiety] without agoraphobia: Secondary | ICD-10-CM | POA: Diagnosis not present

## 2022-03-25 DIAGNOSIS — F321 Major depressive disorder, single episode, moderate: Secondary | ICD-10-CM

## 2022-03-25 DIAGNOSIS — G4700 Insomnia, unspecified: Secondary | ICD-10-CM | POA: Diagnosis not present

## 2022-03-25 DIAGNOSIS — R632 Polyphagia: Secondary | ICD-10-CM | POA: Diagnosis not present

## 2022-03-25 DIAGNOSIS — E559 Vitamin D deficiency, unspecified: Secondary | ICD-10-CM | POA: Diagnosis not present

## 2022-03-25 DIAGNOSIS — F411 Generalized anxiety disorder: Secondary | ICD-10-CM | POA: Diagnosis not present

## 2022-03-25 DIAGNOSIS — F902 Attention-deficit hyperactivity disorder, combined type: Secondary | ICD-10-CM | POA: Diagnosis not present

## 2022-03-25 DIAGNOSIS — E669 Obesity, unspecified: Secondary | ICD-10-CM | POA: Diagnosis not present

## 2022-03-25 DIAGNOSIS — G4733 Obstructive sleep apnea (adult) (pediatric): Secondary | ICD-10-CM | POA: Diagnosis not present

## 2022-03-25 MED ORDER — LISDEXAMFETAMINE DIMESYLATE 70 MG PO CAPS: 70.0000 mg | ORAL_CAPSULE | Freq: Every day | ORAL | 0 refills | Status: DC

## 2022-03-25 MED ORDER — CLONAZEPAM 0.5 MG PO TABS: 0.5000 mg | ORAL_TABLET | Freq: Two times a day (BID) | ORAL | 2 refills | Status: DC

## 2022-03-25 NOTE — Progress Notes (Signed)
Laura Perez [id number] 29-Jul-1990 29 y.o.  Subjective:   Patient ID:  Laura Perez is a 31 y.o. (DOB Oct 26, 1990) female.  Chief Complaint: No chief complaint on file.   HPI Mabeline Varas presents to the office today for follow-up of ADHD, MDD, BED, panic disorder, emotional lability, and physiological insomnia.  Describes mood today as "ok". Pleasant. Denies tearfulness. Mood symptoms - reports decreased anxiety, depression and irritability. Denies panic attacks. Mood has been variable. Stating "I feel like I'm doing better". Not feeling as stressed - grandmother improving. Mother in kidney failure - "not doing too good" - awaiting a transplant. Reports a "couple of IBS flare ups. Has started seeing a therapist twice a week - Doylene Canning. Impoving interest and motivation. Taking medications as prescribed.  Energy levels deceased. Active, does not have a regular exercise routine. Walking 4 to 5000 steps a day. Enjoys some usual interests and activities. Dating. Has a boyfriend. Lives with parents - 2 dogs and 2 cats. Spending time with family and her best friends.  Appetite adequate. Weight stable - 250 pounds. Sleeps better some nights than others. Averages 6 to 7 hours - using CPAP machine. Focus and concentration improved. Completing tasks. Managing aspects of household. Working at [employer] 20 hours a week and [employer] 21 hours. Denies SI or HI.  Denies AH or VH. Denies self harm. Denies substance use.  Previous medication trials: Zoloft, lamictal, Celexa, Elavil  Has not tried - Prozac, Effexor, lexapro, Clomiprimine, Abilify, Rexulti, Seroquel, Latuda, Buspar   PHQ2-9    [place] Office Visit from 05/13/2016 in [place]  PHQ-2 Total Score 4  PHQ-9 Total Score 16      Flowsheet Row ED from 04/23/2021 in [facility] ED from 12/06/2020 in [facility] ED from 09/30/2020 in [place] [facility]  C-SSRS RISK CATEGORY No Risk No Risk No Risk        Review of Systems:  Review of Systems  Musculoskeletal:  Negative for gait problem.  Neurological:  Negative for tremors.  Psychiatric/Behavioral:         Please refer to HPI    Medications: I have reviewed the patient's current medications.  Current Outpatient Medications  Medication Sig Dispense Refill   [START ON 04/22/2022] lisdexamfetamine (VYVANSE) 70 MG capsule Take 1 capsule (70 mg total) by mouth daily. 30 capsule 0   [START ON 05/20/2022] lisdexamfetamine (VYVANSE) 70 MG capsule Take 1 capsule (70 mg total) by mouth daily. 30 capsule 0   beclomethasone (QVAR) 80 MCG/ACT inhaler Inhale 1 puff into the lungs daily as needed (wheezing).     clomiPRAMINE (ANAFRANIL) 50 MG capsule 1 capsule at bedtime 30 capsule 5   clonazePAM (KLONOPIN) 0.5 MG tablet Take 1 tablet (0.5 mg total) by mouth 2 (two) times daily. 60 tablet 2   dextroamphetamine (DEXTROSTAT) 10 MG tablet Take 1 tablet (10 mg total) by mouth daily. 30 tablet 0   diclofenac (VOLTAREN) 75 MG EC tablet Take 1 tablet (75 mg total) by mouth 2 (two) times daily. (Patient not taking: No sig reported) 60 tablet 1   FLUoxetine (PROZAC) 40 MG capsule Take 1 capsule (40 mg total) by mouth at bedtime. 30 capsule 5   Ibuprofen-diphenhydrAMINE Cit (ADVIL PM PO) Take 2 tablets by mouth daily as needed (pain).     lisdexamfetamine (VYVANSE) 70 MG capsule Take 1 capsule (70 mg total) by mouth daily. 30 capsule 0   loratadine (CLARITIN) 10  MG tablet Take 1 tablet (10 mg total) by mouth daily. 15 tablet 0   metoCLOPramide (REGLAN) 10 MG tablet Take 1 tablet (10 mg total) by mouth every 6 (six) hours. 8 tablet 0   NIKKI 3-0.02 MG tablet Take 1 tablet by mouth daily.     ondansetron (ZOFRAN ODT) 4 MG disintegrating tablet Take one tab by mouth Q6hr prn nausea (Dissolve under tongue) (Patient taking differently: Take 4 mg by mouth every 8 (eight) hours as  needed for nausea or vomiting.) 12 tablet 0   OZEMPIC, 0.25 OR 0.5 MG/DOSE, 2 MG/1.5ML SOPN Inject 0.5 mg into the skin once a week. Friday     pantoprazole (PROTONIX) 20 MG tablet Take 1 tablet (20 mg total) by mouth daily. 20 tablet 0   propranolol (INDERAL) 10 MG tablet Take 1 tablet (10 mg total) by mouth 3 (three) times daily as needed. 90 tablet 2   VIBERZI 100 MG TABS Take 1 tablet by mouth 2 (two) times daily.     No current facility-administered medications for this visit.    Medication Side Effects: None  Allergies: No Known Allergies  Past Medical History:  Diagnosis Date   ADHD    Anxiety    Asthma    Depression    GERD (gastroesophageal reflux disease)    IBS (irritable bowel syndrome)    PCOS (polycystic ovarian syndrome)     Past Medical History, Surgical history, Social history, and Family history were reviewed and updated as appropriate.   Please see review of systems for further details on the patient's review from today.   Objective:   Physical Exam:  There were no vitals taken for this visit.  Physical Exam Constitutional:      General: She is not in acute distress. Musculoskeletal:        General: No deformity.  Neurological:     Mental Status: She is alert and oriented to person, place, and time.     Coordination: Coordination normal.  Psychiatric:        Attention and Perception: Attention and perception normal. She does not perceive auditory or visual hallucinations.        Mood and Affect: Mood normal. Mood is not anxious or depressed. Affect is not labile, blunt, angry or inappropriate.        Speech: Speech normal.        Behavior: Behavior normal.        Thought Content: Thought content normal. Thought content is not paranoid or delusional. Thought content does not include homicidal or suicidal ideation. Thought content does not include homicidal or suicidal plan.        Cognition and Memory: Cognition and memory normal.        Judgment:  Judgment normal.     Comments: Insight intact     Lab Review:     Component Value Date/Time   NA 138 12/06/2020 2312   NA 139 03/05/2017 1055   K 4.0 12/06/2020 2312   CL 103 12/06/2020 2312   CO2 25 12/06/2020 2312   GLUCOSE 84 12/06/2020 2312   BUN 13 12/06/2020 2312   BUN 14 03/05/2017 1055   CREATININE 0.88 12/06/2020 2312   CALCIUM 9.5 12/06/2020 2312   PROT 7.3 12/06/2020 2312   PROT 6.9 03/05/2017 1055   ALBUMIN 4.2 12/06/2020 2312   ALBUMIN 4.2 03/05/2017 1055   AST 25 12/06/2020 2312   ALT 34 12/06/2020 2312   ALKPHOS 69 12/06/2020 2312   BILITOT 0.3  12/06/2020 2312   BILITOT 0.3 03/05/2017 1055   GFRNONAA >60 12/06/2020 2312   GFRAA 123 03/05/2017 1055       Component Value Date/Time   WBC 10.2 12/06/2020 2312   RBC 4.75 12/06/2020 2312   HGB 13.4 12/06/2020 2312   HGB 12.5 05/15/2016 1156   HCT 41.1 12/06/2020 2312   HCT 38.4 05/15/2016 1156   PLT 291 12/06/2020 2312   MCV 86.5 12/06/2020 2312   MCV 85 05/15/2016 1156   MCH 28.2 12/06/2020 2312   MCHC 32.6 12/06/2020 2312   RDW 13.7 12/06/2020 2312   RDW 14.1 05/15/2016 1156   LYMPHSABS 3.8 12/06/2020 2312   LYMPHSABS 2.9 05/15/2016 1156   MONOABS 0.6 12/06/2020 2312   EOSABS 0.2 12/06/2020 2312   EOSABS 0.1 05/15/2016 1156   BASOSABS 0.1 12/06/2020 2312   BASOSABS 0.1 05/15/2016 1156    No results found for: "POCLITH", "LITHIUM"   No results found for: "PHENYTOIN", "PHENOBARB", "VALPROATE", "CBMZ"   .res Assessment: Plan:    Plan:  1. Clopimramine 50mg  at hs 2. Prozac 40mg  daily 3. Propranolol 10mg  TID 4. Clonazepam 0.5mg  - 2 at hs 5. Vyvanse 70mg  every morning  Clomiramine level - lab slip given   4 weeks  Discussed potential benefits, risks, and side effects of stimulants with patient to include increased heart rate, palpitations, insomnia, increased anxiety, increased irritability, or decreased appetite.  Instructed patient to contact office if experiencing any significant  tolerability issues.  Discussed potential benefits, risk, and side effects of benzodiazepines to include potential risk of tolerance and dependence, as well as possible drowsiness.  Advised patient not to drive if experiencing drowsiness and to take lowest possible effective dose to minimize risk of dependence and tolerance.  Patient advised to contact office with any questions, adverse effects, or acute worsening in signs and symptoms.  Diagnoses and all orders for this visit:  Attention deficit hyperactivity disorder (ADHD), combined type, moderate -     lisdexamfetamine (VYVANSE) 70 MG capsule; Take 1 capsule (70 mg total) by mouth daily. -     lisdexamfetamine (VYVANSE) 70 MG capsule; Take 1 capsule (70 mg total) by mouth daily. -     lisdexamfetamine (VYVANSE) 70 MG capsule; Take 1 capsule (70 mg total) by mouth daily.  Moderate major depression, single episode (HCC) -     clonazePAM (KLONOPIN) 0.5 MG tablet; Take 1 tablet (0.5 mg total) by mouth 2 (two) times daily.  Physiological insomnia  Panic disorder  Generalized anxiety disorder     Please see After Visit Summary for patient specific instructions.  No future appointments.   No orders of the defined types were placed in this encounter.   -------------------------------

## 2022-03-27 DIAGNOSIS — F900 Attention-deficit hyperactivity disorder, predominantly inattentive type: Secondary | ICD-10-CM | POA: Diagnosis not present

## 2022-03-27 DIAGNOSIS — F411 Generalized anxiety disorder: Secondary | ICD-10-CM | POA: Diagnosis not present

## 2022-04-01 DIAGNOSIS — F900 Attention-deficit hyperactivity disorder, predominantly inattentive type: Secondary | ICD-10-CM | POA: Diagnosis not present

## 2022-04-01 DIAGNOSIS — F411 Generalized anxiety disorder: Secondary | ICD-10-CM | POA: Diagnosis not present

## 2022-04-08 DIAGNOSIS — F411 Generalized anxiety disorder: Secondary | ICD-10-CM | POA: Diagnosis not present

## 2022-04-08 DIAGNOSIS — F900 Attention-deficit hyperactivity disorder, predominantly inattentive type: Secondary | ICD-10-CM | POA: Diagnosis not present

## 2022-04-09 DIAGNOSIS — G4733 Obstructive sleep apnea (adult) (pediatric): Secondary | ICD-10-CM | POA: Diagnosis not present

## 2022-04-10 DIAGNOSIS — F411 Generalized anxiety disorder: Secondary | ICD-10-CM | POA: Diagnosis not present

## 2022-04-10 DIAGNOSIS — F900 Attention-deficit hyperactivity disorder, predominantly inattentive type: Secondary | ICD-10-CM | POA: Diagnosis not present

## 2022-04-15 DIAGNOSIS — F411 Generalized anxiety disorder: Secondary | ICD-10-CM | POA: Diagnosis not present

## 2022-04-15 DIAGNOSIS — F900 Attention-deficit hyperactivity disorder, predominantly inattentive type: Secondary | ICD-10-CM | POA: Diagnosis not present

## 2022-04-22 DIAGNOSIS — F900 Attention-deficit hyperactivity disorder, predominantly inattentive type: Secondary | ICD-10-CM | POA: Diagnosis not present

## 2022-04-22 DIAGNOSIS — F411 Generalized anxiety disorder: Secondary | ICD-10-CM | POA: Diagnosis not present

## 2022-04-30 ENCOUNTER — Emergency Department (HOSPITAL_BASED_OUTPATIENT_CLINIC_OR_DEPARTMENT_OTHER): Payer: BC Managed Care – PPO

## 2022-04-30 ENCOUNTER — Emergency Department (HOSPITAL_BASED_OUTPATIENT_CLINIC_OR_DEPARTMENT_OTHER)
Admission: EM | Admit: 2022-04-30 | Discharge: 2022-04-30 | Disposition: A | Discharge status: Home / Self Care | Payer: BC Managed Care – PPO | Attending: Emergency Medicine | Admitting: Emergency Medicine

## 2022-04-30 ENCOUNTER — Encounter (HOSPITAL_BASED_OUTPATIENT_CLINIC_OR_DEPARTMENT_OTHER): Payer: Self-pay

## 2022-04-30 ENCOUNTER — Other Ambulatory Visit: Payer: Self-pay

## 2022-04-30 DIAGNOSIS — Z7984 Long term (current) use of oral hypoglycemic drugs: Secondary | ICD-10-CM | POA: Insufficient documentation

## 2022-04-30 DIAGNOSIS — R1084 Generalized abdominal pain: Secondary | ICD-10-CM | POA: Diagnosis not present

## 2022-04-30 DIAGNOSIS — R1030 Lower abdominal pain, unspecified: Secondary | ICD-10-CM | POA: Diagnosis present

## 2022-04-30 DIAGNOSIS — J45909 Unspecified asthma, uncomplicated: Secondary | ICD-10-CM | POA: Diagnosis not present

## 2022-04-30 LAB — URINALYSIS, ROUTINE W REFLEX MICROSCOPIC
Bilirubin Urine: NEGATIVE
Glucose, UA: NEGATIVE mg/dL
Hgb urine dipstick: NEGATIVE
Leukocytes,Ua: NEGATIVE
Nitrite: NEGATIVE
Specific Gravity, Urine: 1.037 — ABNORMAL HIGH (ref 1.005–1.030)
pH: 5.5 (ref 5.0–8.0)

## 2022-04-30 LAB — COMPREHENSIVE METABOLIC PANEL
ALT: 10 U/L (ref 0–44)
AST: 16 U/L (ref 15–41)
Albumin: 4.2 g/dL (ref 3.5–5.0)
Alkaline Phosphatase: 70 U/L (ref 38–126)
Anion gap: 12 (ref 5–15)
BUN: 16 mg/dL (ref 6–20)
CO2: 22 mmol/L (ref 22–32)
Calcium: 9.3 mg/dL (ref 8.9–10.3)
Chloride: 105 mmol/L (ref 98–111)
Creatinine, Ser: 0.78 mg/dL (ref 0.44–1.00)
GFR, Estimated: >60 mL/min (ref >60)
Glucose, Bld: 100 mg/dL — ABNORMAL HIGH (ref 70–99)
Potassium: 4.2 mmol/L (ref 3.5–5.1)
Sodium: 139 mmol/L (ref 135–145)
Total Bilirubin: 0.3 mg/dL (ref 0.3–1.2)
Total Protein: 7.6 g/dL (ref 6.5–8.1)

## 2022-04-30 LAB — CBC WITH DIFFERENTIAL/PLATELET
Abs Immature Granulocytes: 0.04 10*3/uL (ref 0.00–0.07)
Basophils Absolute: 0.1 10*3/uL (ref 0.0–0.1)
Basophils Relative: 1 %
Eosinophils Absolute: 0.2 10*3/uL (ref 0.0–0.5)
Eosinophils Relative: 2 %
HCT: 41 % (ref 36.0–46.0)
Hemoglobin: 13.4 g/dL (ref 12.0–15.0)
Immature Granulocytes: 0 %
Lymphocytes Relative: 39 %
Lymphs Abs: 3.9 10*3/uL (ref 0.7–4.0)
MCH: 28.3 pg (ref 26.0–34.0)
MCHC: 32.7 g/dL (ref 30.0–36.0)
MCV: 86.7 fL (ref 80.0–100.0)
Monocytes Absolute: 0.7 10*3/uL (ref 0.1–1.0)
Monocytes Relative: 7 %
Neutro Abs: 5.1 10*3/uL (ref 1.7–7.7)
Neutrophils Relative %: 51 %
Platelets: 298 10*3/uL (ref 150–400)
RBC: 4.73 MIL/uL (ref 3.87–5.11)
RDW: 14.2 % (ref 11.5–15.5)
WBC: 10 10*3/uL (ref 4.0–10.5)
nRBC: 0 % (ref 0.0–0.2)

## 2022-04-30 LAB — LIPASE, BLOOD: Lipase: 25 U/L (ref 11–51)

## 2022-04-30 LAB — PREGNANCY, URINE: Preg Test, Ur: NEGATIVE

## 2022-04-30 MED ORDER — DICYCLOMINE HCL 20 MG PO TABS: 20.0000 mg | ORAL_TABLET | Freq: Two times a day (BID) | ORAL | 0 refills | Status: DC

## 2022-04-30 MED ORDER — IOHEXOL 300 MG/ML  SOLN
100.0000 mL | Freq: Once | INTRAMUSCULAR | Status: AC | PRN
  Administered 2022-04-30: 100 mL via INTRAVENOUS

## 2022-04-30 MED ORDER — SODIUM CHLORIDE 0.9 % IV BOLUS
1000.0000 mL | Freq: Once | INTRAVENOUS | Status: AC
  Administered 2022-04-30: 1000 mL via INTRAVENOUS

## 2022-04-30 NOTE — ED Provider Triage Note (Signed)
Emergency Medicine Provider Triage Evaluation Note  Laura Perez , a 31 y.o. female  was evaluated in triage.  Pt complains of lower abdominal pain, sharp in nature, onset last night, constant. Nothing makes pain better, no relief with Levsin. History of IBS D, now constipated.   NO prior abdominal surgeries. Nausea, no vomiting. LMP 2 months ago, hx PCOS.  Review of Systems  Positive: As above Negative: As above  Physical Exam  BP (!) 146/79 (BP Location: Left Arm)   Pulse 88   Temp 98.4 F (36.9 C)   Resp 18   SpO2 100%  Gen:   Awake, no distress   Resp:  Normal effort  MSK:   Moves extremities without difficulty  Other:    Medical Decision Making  Medically screening exam initiated at 2:48 PM.  Appropriate orders placed.  Belanna Manring was informed that the remainder of the evaluation will be completed by another provider, this initial triage assessment does not replace that evaluation, and the importance of remaining in the ED until their evaluation is complete.     Jeannie Fend, PA-C 04/30/22 1449

## 2022-04-30 NOTE — Discharge Instructions (Signed)
Bentyl as prescribed. Follow up with PCP and GI.

## 2022-04-30 NOTE — ED Notes (Signed)
Discharge paperwork given and verbally understood. 

## 2022-04-30 NOTE — ED Provider Notes (Signed)
[facility] EMERGENCY DEPT Provider Note   CSN: [id number] Arrival date & time: 04/30/22  1402     History  Chief Complaint  Patient presents with   Abdominal Pain    Laura Perez is a 31 y.o. female.   Pt complains of lower abdominal pain, sharp in nature, onset last night, constant. Nothing makes pain better, no relief with Levsin. History of IBS D, now constipated.   NO prior abdominal surgeries. Nausea, no vomiting. LMP 2 months ago, hx PCOS.  Patient is not sexually active, denies concerns for STIs.         Home Medications Prior to Admission medications   Medication Sig Start Date End Date Taking? Authorizing Provider  dicyclomine (BENTYL) 20 MG tablet Take 1 tablet (20 mg total) by mouth 2 (two) times daily. 04/30/22  Yes Tacy Learn, PA-C  beclomethasone (QVAR) 80 MCG/ACT inhaler Inhale 1 puff into the lungs daily as needed (wheezing). 05/11/15   [provider]  clomiPRAMINE (ANAFRANIL) 50 MG capsule 1 capsule at bedtime 02/19/22   Mozingo, Berdie Ogren, NP  clonazePAM (KLONOPIN) 0.5 MG tablet Take 1 tablet (0.5 mg total) by mouth 2 (two) times daily. 03/25/22   Mozingo, Berdie Ogren, NP  dextroamphetamine (DEXTROSTAT) 10 MG tablet Take 1 tablet (10 mg total) by mouth daily. 12/05/20   Mozingo, Berdie Ogren, NP  diclofenac (VOLTAREN) 75 MG EC tablet Take 1 tablet (75 mg total) by mouth 2 (two) times daily. Patient not taking: No sig reported 05/21/16   Gean Birchwood, DPM  FLUoxetine (PROZAC) 40 MG capsule Take 1 capsule (40 mg total) by mouth at bedtime. 02/19/22   Mozingo, Berdie Ogren, NP  Ibuprofen-diphenhydrAMINE Cit (ADVIL PM PO) Take 2 tablets by mouth daily as needed (pain).    [provider]  lisdexamfetamine (VYVANSE) 70 MG capsule Take 1 capsule (70 mg total) by mouth daily. 03/25/22   Mozingo, Berdie Ogren, NP  lisdexamfetamine (VYVANSE) 70 MG capsule Take 1 capsule (70 mg total) by mouth daily.  04/22/22   Mozingo, Berdie Ogren, NP  lisdexamfetamine (VYVANSE) 70 MG capsule Take 1 capsule (70 mg total) by mouth daily. 05/20/22   Mozingo, Berdie Ogren, NP  loratadine (CLARITIN) 10 MG tablet Take 1 tablet (10 mg total) by mouth daily. 04/23/21   Palumbo, April, MD  metFORMIN (GLUCOPHAGE-XR) 500 MG 24 hr tablet Take 1,000 mg by mouth 2 (two) times daily. 03/11/22   [provider]  metoCLOPramide (REGLAN) 10 MG tablet Take 1 tablet (10 mg total) by mouth every 6 (six) hours. 12/07/20   Davonna Belling, MD  NIKKI 3-0.02 MG tablet Take 1 tablet by mouth daily. 06/02/20   [provider]  NURTEC 75 MG TBDP Take 1 tablet by mouth daily as needed. 02/06/22   [provider]  ondansetron (ZOFRAN ODT) 4 MG disintegrating tablet Take one tab by mouth Q6hr prn nausea (Dissolve under tongue) Patient taking differently: Take 4 mg by mouth every 8 (eight) hours as needed for nausea or vomiting. 04/09/16   Kandra Nicolas, MD  OZEMPIC, 0.25 OR 0.5 MG/DOSE, 2 MG/1.5ML SOPN Inject 0.5 mg into the skin once a week. Friday 05/30/20   [provider]  pantoprazole (PROTONIX) 20 MG tablet Take 1 tablet (20 mg total) by mouth daily. 06/17/20   Milton Ferguson, MD  propranolol (INDERAL) 10 MG tablet Take 1 tablet (10 mg total) by mouth 3 (three) times daily as needed. 02/19/22   Mozingo, Berdie Ogren, NP  topiramate (TOPAMAX)  25 MG tablet Take 25 mg by mouth 2 (two) times daily. 03/18/22   [provider]  VIBERZI 100 MG TABS Take 1 tablet by mouth 2 (two) times daily. 04/04/20   [provider]      Allergies    Imitrex [sumatriptan]    Review of Systems   Review of Systems Negative except as per HPI Physical Exam Updated Vital Signs BP 135/72 (BP Location: Right Arm)   Pulse 73   Temp 98.4 F (36.9 C) (Oral)   Resp 18   Ht 5' 6.5" (1.689 m)   Wt 103.9 kg   SpO2 100%   BMI 36.42 kg/m  Physical Exam Vitals and nursing note reviewed.   Constitutional:      General: She is not in acute distress.    Appearance: She is well-developed. She is obese. She is not diaphoretic.  HENT:     Head: Normocephalic and atraumatic.  Cardiovascular:     Rate and Rhythm: Normal rate and regular rhythm.     Heart sounds: Normal heart sounds.  Pulmonary:     Effort: Pulmonary effort is normal.     Breath sounds: Normal breath sounds.  Abdominal:     General: Bowel sounds are decreased.     Palpations: Abdomen is soft.     Tenderness: There is abdominal tenderness in the right lower quadrant, left upper quadrant and left lower quadrant. There is no right CVA tenderness or left CVA tenderness.  Neurological:     Mental Status: She is alert and oriented to person, place, and time.  Psychiatric:        Behavior: Behavior normal.     ED Results / Procedures / Treatments   Labs (all labs ordered are listed, but only abnormal results are displayed) Labs Reviewed  COMPREHENSIVE METABOLIC PANEL - Abnormal; Notable for the following components:      Result Value   Glucose, Bld 100 (*)    All other components within normal limits  URINALYSIS, ROUTINE W REFLEX MICROSCOPIC - Abnormal; Notable for the following components:   Specific Gravity, Urine 1.037 (*)    Ketones, ur TRACE (*)    Protein, ur TRACE (*)    All other components within normal limits  CBC WITH DIFFERENTIAL/PLATELET  LIPASE, BLOOD  PREGNANCY, URINE    EKG None  Radiology CT Abdomen Pelvis W Contrast  Result Date: 04/30/2022 CLINICAL DATA:  Lower abdominal pain. EXAM: CT ABDOMEN AND PELVIS WITH CONTRAST TECHNIQUE: Multidetector CT imaging of the abdomen and pelvis was performed using the standard protocol following bolus administration of intravenous contrast. RADIATION DOSE REDUCTION: This exam was performed according to the departmental dose-optimization program which includes automated exposure control, adjustment of the mA and/or kV according to patient size and/or  use of iterative reconstruction technique. CONTRAST:  145mL OMNIPAQUE IOHEXOL 300 MG/ML  SOLN COMPARISON:  December 07, 2020 FINDINGS: Lower chest: No acute abnormality. Hepatobiliary: No focal liver abnormality is seen. The gallbladder is moderately distended without evidence of gallstones, gallbladder wall thickening, or biliary dilatation. Pancreas: Unremarkable. No pancreatic ductal dilatation or surrounding inflammatory changes. Spleen: Normal in size without focal abnormality. Adrenals/Urinary Tract: Adrenal glands are unremarkable. Kidneys are normal, without renal calculi, focal lesion, or hydronephrosis. Bladder is unremarkable. Stomach/Bowel: There is a small hiatal hernia. Appendix appears normal. No evidence of bowel wall thickening, distention, or inflammatory changes. Vascular/Lymphatic: No significant vascular findings are present. No enlarged abdominal or pelvic lymph nodes. Reproductive: Uterus and bilateral adnexa are unremarkable. Other:  No abdominal wall hernia or abnormality. No abdominopelvic ascites. Musculoskeletal: No acute or significant osseous findings. IMPRESSION: Small hiatal hernia. Electronically Signed   By: Virgina Norfolk M.D.   On: 04/30/2022 19:20    Procedures Procedures    Medications Ordered in ED Medications  sodium chloride 0.9 % bolus 1,000 mL (0 mLs Intravenous Stopped 04/30/22 1952)  iohexol (OMNIPAQUE) 300 MG/ML solution 100 mL (100 mLs Intravenous Contrast Given 04/30/22 1904)    ED Course/ Medical Decision Making/ A&P                           Medical Decision Making Amount and/or Complexity of Data Reviewed Labs: ordered. Radiology: ordered.  Risk Prescription drug management.   This patient presents to the ED for concern of abdominal pain, constipation, this involves an extensive number of treatment options, and is a complaint that carries with it a high risk of complications and morbidity.  The differential diagnosis includes but not limited to  IBS, constipation, bowel obstruction   Co morbidities that complicate the patient evaluation  PCOS, IBS, anxiety, ADHD, asthma, GERD, depression No prior abdominal surgeries   Additional history obtained:  Additional history obtained from significant other at bedside who contributes to history as above External records from outside source obtained and reviewed including prior labs on file for comparison   Lab Tests:  I Ordered, and personally interpreted labs.  The pertinent results include: CBC within normal notes.  CMP without significant findings.  Lipase normal.  hCG negative.  Urinalysis was positive for ketones and protein, no evidence of UTI.   Imaging Studies ordered:  I ordered imaging studies including CT abdomen pelvis with contrast I independently visualized and interpreted imaging which showed no acute abnormality.  Incidental finding of hiatal hernia I agree with the radiologist interpretation   Consultations Obtained:  I requested consultation with the Dr. Armandina Gemma, ER attending,  and discussed lab and imaging findings as well as pertinent plan - they recommend: consider pelvic (patient deferred), follow up with GI   Problem List / ED Course / Critical interventions / Medication management  31 year old female with complaint of abdominal pain as above.  On exam, is alert, nontoxic, no distress.  Vitals reviewed and reassuring.  Lab work reviewed, no significant findings.  Discussed results with patient, offered CT abdomen pelvis for further evaluation of the pain bringing her into the ER today versus reassurance with labs and recommendation for outpatient follow-up and recheck to minimize radiation at her young age.  Patient prefers to proceed with CT abdomen pelvis for further evaluation.  CT does not show any significant findings.  Discussed incidental finding of hiatal hernia.  Provided with referral to GI.  Can trial Bentyl for her discomfort. I ordered medication  including IVF  for hydration   Reevaluation of the patient after these medicines showed that the patient improved I have reviewed the patients home medicines and have made adjustments as needed   Social Determinants of Health:  Here with mom, boyfriend. Has PCP for follow up, provided with referral to GI.    Test / Admission - Considered:  Consider pelvic exam, patient deferred for time being, no pelvic findings on CT         Final Clinical Impression(s) / ED Diagnoses Final diagnoses:  Generalized abdominal pain    Rx / DC Orders ED Discharge Orders          Ordered    dicyclomine (  BENTYL) 20 MG tablet  2 times daily        04/30/22 2020              Jeannie Fend, PA-C 04/30/22 2043    Ernie Avena, MD 04/30/22 [phone]

## 2022-04-30 NOTE — ED Triage Notes (Signed)
Patient here POV from Home.  Endorses Lower ABD Pain that began Last PM. Some Nausea. No Emesis.   History of IBS. Possible Loose Stools and Constipation as well.   NAD Noted during Triage. A&Ox4. GCS 15. Ambulatory.

## 2022-05-23 ENCOUNTER — Emergency Department (HOSPITAL_COMMUNITY)
Admission: EM | Admit: 2022-05-23 | Discharge: 2022-05-23 | Disposition: A | Discharge status: Home / Self Care | Payer: BC Managed Care – PPO | Attending: Emergency Medicine | Admitting: Emergency Medicine

## 2022-05-23 ENCOUNTER — Encounter (HOSPITAL_COMMUNITY): Payer: Self-pay

## 2022-05-23 DIAGNOSIS — K648 Other hemorrhoids: Secondary | ICD-10-CM | POA: Insufficient documentation

## 2022-05-23 DIAGNOSIS — R197 Diarrhea, unspecified: Secondary | ICD-10-CM

## 2022-05-23 DIAGNOSIS — K640 First degree hemorrhoids: Secondary | ICD-10-CM

## 2022-05-23 DIAGNOSIS — K921 Melena: Secondary | ICD-10-CM | POA: Insufficient documentation

## 2022-05-23 LAB — CBC WITH DIFFERENTIAL/PLATELET
Abs Immature Granulocytes: 0.05 10*3/uL (ref 0.00–0.07)
Basophils Absolute: 0.1 10*3/uL (ref 0.0–0.1)
Basophils Relative: 1 %
Eosinophils Absolute: 0.2 10*3/uL (ref 0.0–0.5)
Eosinophils Relative: 2 %
HCT: 41.4 % (ref 36.0–46.0)
Hemoglobin: 13.1 g/dL (ref 12.0–15.0)
Immature Granulocytes: 1 %
Lymphocytes Relative: 28 %
Lymphs Abs: 2.9 10*3/uL (ref 0.7–4.0)
MCH: 28.1 pg (ref 26.0–34.0)
MCHC: 31.6 g/dL (ref 30.0–36.0)
MCV: 88.8 fL (ref 80.0–100.0)
Monocytes Absolute: 0.5 10*3/uL (ref 0.1–1.0)
Monocytes Relative: 5 %
Neutro Abs: 6.4 10*3/uL (ref 1.7–7.7)
Neutrophils Relative %: 63 %
Platelets: 309 10*3/uL (ref 150–400)
RBC: 4.66 MIL/uL (ref 3.87–5.11)
RDW: 14.1 % (ref 11.5–15.5)
WBC: 10.1 10*3/uL (ref 4.0–10.5)
nRBC: 0 % (ref 0.0–0.2)

## 2022-05-23 LAB — COMPREHENSIVE METABOLIC PANEL
ALT: 16 U/L (ref 0–44)
AST: 23 U/L (ref 15–41)
Albumin: 3.4 g/dL — ABNORMAL LOW (ref 3.5–5.0)
Alkaline Phosphatase: 67 U/L (ref 38–126)
Anion gap: 8 (ref 5–15)
BUN: 13 mg/dL (ref 6–20)
CO2: 21 mmol/L — ABNORMAL LOW (ref 22–32)
Calcium: 9.8 mg/dL (ref 8.9–10.3)
Chloride: 109 mmol/L (ref 98–111)
Creatinine, Ser: 0.94 mg/dL (ref 0.44–1.00)
GFR, Estimated: >60 mL/min (ref >60)
Glucose, Bld: 93 mg/dL (ref 70–99)
Potassium: 4.4 mmol/L (ref 3.5–5.1)
Sodium: 138 mmol/L (ref 135–145)
Total Bilirubin: 0.4 mg/dL (ref 0.3–1.2)
Total Protein: 7.7 g/dL (ref 6.5–8.1)

## 2022-05-23 LAB — LIPASE, BLOOD: Lipase: 28 U/L (ref 11–51)

## 2022-05-23 MED ORDER — HYDROCORTISONE ACETATE 25 MG RE SUPP: 25.0000 mg | Freq: Two times a day (BID) | RECTAL | 0 refills | Status: AC

## 2022-05-23 MED ORDER — DICYCLOMINE HCL 20 MG PO TABS: 20.0000 mg | ORAL_TABLET | Freq: Two times a day (BID) | ORAL | 0 refills | Status: DC

## 2022-05-23 NOTE — Discharge Instructions (Addendum)
You likely have a mild flareup from your IBS.  You also some rectal irritation from your diarrhea.  I recommend you continue taking fiber.  I also recommend you use Anusol suppositories twice daily.  You are expected to have some rectal bleeding small hemorrhoid  You can take Bentyl for cramps.  Stay hydrated.  Please follow-up with your GI doctor  Return to ER if you have uncontrolled bleeding or severe cramps or vomiting

## 2022-05-23 NOTE — ED Provider Notes (Signed)
[facility]-EMERGENCY DEPT Provider Note   CSN: [id number] Arrival date & time: 05/23/22  1406     History  Chief Complaint  Patient presents with   Blood In Stools    Laura Perez is a 31 y.o. female history of IBS, panic disorder, ADHD here presenting with abdominal cramping and diarrhea.  Patient was diagnosed with IBS and was seen in the ED on December 6.  She had a CT scan at that time that was unremarkable.  She then followed up with Dr. Donnald Garre from GI.  She was told to eat more fiber.  She initially had constipation and for the last week has diarrhea.  She was told she needs a stool sample and gave a stool sample 5 days ago and noticed some blood in her stool.  She states that since then she has been having diarrhea.  She has been having some blood around her stools for the last several days.  Patient also has some abdominal cramps.  Denies any vomiting or fevers.  The history is provided by the patient.       Home Medications Prior to Admission medications   Medication Sig Start Date End Date Taking? Authorizing Provider  hydrocortisone (ANUSOL-HC) 25 MG suppository Place 1 suppository (25 mg total) rectally 2 (two) times daily. 05/23/22  Yes Charlynne Pander, MD  beclomethasone (QVAR) 80 MCG/ACT inhaler Inhale 1 puff into the lungs daily as needed (wheezing). 05/11/15   [provider]  clomiPRAMINE (ANAFRANIL) 50 MG capsule 1 capsule at bedtime 02/19/22   Mozingo, Thereasa Solo, NP  clonazePAM (KLONOPIN) 0.5 MG tablet Take 1 tablet (0.5 mg total) by mouth 2 (two) times daily. 03/25/22   Mozingo, Thereasa Solo, NP  dextroamphetamine (DEXTROSTAT) 10 MG tablet Take 1 tablet (10 mg total) by mouth daily. 12/05/20   Mozingo, Thereasa Solo, NP  diclofenac (VOLTAREN) 75 MG EC tablet Take 1 tablet (75 mg total) by mouth 2 (two) times daily. Patient not taking: No sig reported 05/21/16   Carrington Clamp, DPM  dicyclomine (BENTYL) 20 MG  tablet Take 1 tablet (20 mg total) by mouth 2 (two) times daily. 05/23/22   Charlynne Pander, MD  FLUoxetine (PROZAC) 40 MG capsule Take 1 capsule (40 mg total) by mouth at bedtime. 02/19/22   Mozingo, Thereasa Solo, NP  Ibuprofen-diphenhydrAMINE Cit (ADVIL PM PO) Take 2 tablets by mouth daily as needed (pain).    [provider]  lisdexamfetamine (VYVANSE) 70 MG capsule Take 1 capsule (70 mg total) by mouth daily. 03/25/22   Mozingo, Thereasa Solo, NP  lisdexamfetamine (VYVANSE) 70 MG capsule Take 1 capsule (70 mg total) by mouth daily. 04/22/22   Mozingo, Thereasa Solo, NP  lisdexamfetamine (VYVANSE) 70 MG capsule Take 1 capsule (70 mg total) by mouth daily. 05/20/22   Mozingo, Thereasa Solo, NP  loratadine (CLARITIN) 10 MG tablet Take 1 tablet (10 mg total) by mouth daily. 04/23/21   Palumbo, April, MD  metFORMIN (GLUCOPHAGE-XR) 500 MG 24 hr tablet Take 1,000 mg by mouth 2 (two) times daily. 03/11/22   [provider]  metoCLOPramide (REGLAN) 10 MG tablet Take 1 tablet (10 mg total) by mouth every 6 (six) hours. 12/07/20   Benjiman Core, MD  NIKKI 3-0.02 MG tablet Take 1 tablet by mouth daily. 06/02/20   [provider]  NURTEC 75 MG TBDP Take 1 tablet by mouth daily as needed. 02/06/22   [provider]  ondansetron (ZOFRAN ODT) 4 MG disintegrating tablet Take  one tab by mouth Q6hr prn nausea (Dissolve under tongue) Patient taking differently: Take 4 mg by mouth every 8 (eight) hours as needed for nausea or vomiting. 04/09/16   Kandra Nicolas, MD  OZEMPIC, 0.25 OR 0.5 MG/DOSE, 2 MG/1.5ML SOPN Inject 0.5 mg into the skin once a week. Friday 05/30/20   [provider]  pantoprazole (PROTONIX) 20 MG tablet Take 1 tablet (20 mg total) by mouth daily. 06/17/20   Milton Ferguson, MD  propranolol (INDERAL) 10 MG tablet Take 1 tablet (10 mg total) by mouth 3 (three) times daily as needed. 02/19/22   Mozingo, Berdie Ogren, NP  topiramate (TOPAMAX) 25  MG tablet Take 25 mg by mouth 2 (two) times daily. 03/18/22   [provider]  VIBERZI 100 MG TABS Take 1 tablet by mouth 2 (two) times daily. 04/04/20   [provider]      Allergies    Imitrex [sumatriptan]    Review of Systems   Review of Systems  Gastrointestinal:  Positive for abdominal pain and blood in stool.  All other systems reviewed and are negative.   Physical Exam Updated Vital Signs BP (!) 145/90   Pulse 94   Temp 97.9 F (36.6 C) (Oral)   Resp 18   LMP 04/09/2022   SpO2 99%  Physical Exam Vitals and nursing note reviewed.  Constitutional:      Comments: Slightly anxious  HENT:     Head: Normocephalic.     Nose: Nose normal.     Mouth/Throat:     Mouth: Mucous membranes are dry.  Eyes:     Extraocular Movements: Extraocular movements intact.     Pupils: Pupils are equal, round, and reactive to light.  Cardiovascular:     Rate and Rhythm: Normal rate and regular rhythm.     Pulses: Normal pulses.     Heart sounds: Normal heart sounds.  Pulmonary:     Effort: Pulmonary effort is normal.     Breath sounds: Normal breath sounds.  Abdominal:     General: Abdomen is flat.     Comments: Abdomen is soft and no abdominal tenderness  Genitourinary:    Comments: Rectal-some rectal irritation from diarrhea.  Small internal hemorrhoid with no active bleeding.  Patient has brown stools and no blood on the rectal exam Musculoskeletal:     Cervical back: Normal range of motion and neck supple.  Skin:    General: Skin is warm.     Capillary Refill: Capillary refill takes less than 2 seconds.  Neurological:     General: No focal deficit present.     Mental Status: She is alert and oriented to person, place, and time.  Psychiatric:        Mood and Affect: Mood normal.        Behavior: Behavior normal.     ED Results / Procedures / Treatments   Labs (all labs ordered are listed, but only abnormal results are displayed) Labs Reviewed   COMPREHENSIVE METABOLIC PANEL - Abnormal; Notable for the following components:      Result Value   CO2 21 (*)    Albumin 3.4 (*)    All other components within normal limits  CBC WITH DIFFERENTIAL/PLATELET  LIPASE, BLOOD  URINALYSIS, ROUTINE W REFLEX MICROSCOPIC    EKG None  Radiology No results found.  Procedures Procedures    Medications Ordered in ED Medications - No data to display  ED Course/ Medical Decision Making/ A&P  Medical Decision Making Laura Perez is a 31 y.o. female here presenting with blood in her stool.  Patient was seen previously had a unremarkable CT abdomen pelvis within the last month.  She then saw GI and was diagnosed with IBS.  She initially has constipation now has diarrhea.  I think her blood in her stool is likely from diarrhea with some rectal irritation versus small internal hemorrhoid.  She has labs drawn in triage and her hemoglobin is stable.  I do not think she needs another CT abdomen pelvis today.  She is getting a workup by GI and I recommend that she follows up with Dr. Pauline Aus outpatient.  Commend Anusol cream for hemorrhoid.    Problems Addressed: Blood in stool: acute illness or injury Diarrhea, unspecified type: acute illness or injury Grade I hemorrhoids: acute illness or injury  Amount and/or Complexity of Data Reviewed Labs: ordered. Decision-making details documented in ED Course.  Risk Prescription drug management.    Final Clinical Impression(s) / ED Diagnoses Final diagnoses:  Blood in stool  Diarrhea, unspecified type  Grade I hemorrhoids    Rx / DC Orders ED Discharge Orders          Ordered    hydrocortisone (ANUSOL-HC) 25 MG suppository  2 times daily        05/23/22 2244    dicyclomine (BENTYL) 20 MG tablet  2 times daily        05/23/22 2245              Drenda Freeze, MD 05/23/22 2255

## 2022-05-23 NOTE — ED Notes (Signed)
Pt wheel chaired to bathroom because pt states that she almost passed out walking earlier.

## 2022-05-23 NOTE — ED Provider Triage Note (Signed)
Emergency Medicine Provider Triage Evaluation Note  Laura Perez , a 31 y.o. female  was evaluated in triage.  Pt complains of bloody bowel movements for the past few days.  Patient is currently being evaluated by gastroenterology for possible Crohn's disease.  Patient has a history of IBS mixed.  She states that she has very frequent diarrhea and is continue to have the same.  Over the past week including when she had a stool sample to gastroenterology on Monday she noticed some bright red blood mixed in along with mucus and some dark specks.  She complains of generalized abdominal pain but had a CT scan earlier this month.  Denies nausea, vomiting  Review of Systems  Positive: As above Negative: As above  Physical Exam  BP (!) 153/100 (BP Location: Left Arm)   Pulse (!) 103   Temp 98.1 F (36.7 C) (Oral)   Resp 18   LMP 04/09/2022   SpO2 100%  Gen:   Awake, no distress   Resp:  Normal effort  MSK:   Moves extremities without difficulty  Other:    Medical Decision Making  Medically screening exam initiated at 3:18 PM.  Appropriate orders placed.  Laura Perez was informed that the remainder of the evaluation will be completed by another provider, this initial triage assessment does not replace that evaluation, and the importance of remaining in the ED until their evaluation is complete.     Laura Grinder, PA-C 05/23/22 1526

## 2022-05-24 LAB — URINALYSIS, ROUTINE W REFLEX MICROSCOPIC
Bilirubin Urine: NEGATIVE
Glucose, UA: NEGATIVE mg/dL
Hgb urine dipstick: NEGATIVE
Ketones, ur: NEGATIVE mg/dL
Nitrite: NEGATIVE
Protein, ur: NEGATIVE mg/dL
Specific Gravity, Urine: 1.027 (ref 1.005–1.030)
pH: 5 (ref 5.0–8.0)

## 2022-05-27 ENCOUNTER — Other Ambulatory Visit: Payer: Self-pay | Admitting: Adult Health

## 2022-05-27 DIAGNOSIS — F410 Panic disorder [episodic paroxysmal anxiety] without agoraphobia: Secondary | ICD-10-CM

## 2022-05-27 DIAGNOSIS — F411 Generalized anxiety disorder: Secondary | ICD-10-CM

## 2022-06-24 ENCOUNTER — Other Ambulatory Visit: Payer: Self-pay | Admitting: Adult Health

## 2022-06-24 ENCOUNTER — Ambulatory Visit (INDEPENDENT_AMBULATORY_CARE_PROVIDER_SITE_OTHER): Payer: Self-pay | Admitting: Adult Health

## 2022-06-24 DIAGNOSIS — F411 Generalized anxiety disorder: Secondary | ICD-10-CM

## 2022-06-24 DIAGNOSIS — F410 Panic disorder [episodic paroxysmal anxiety] without agoraphobia: Secondary | ICD-10-CM

## 2022-06-24 DIAGNOSIS — F489 Nonpsychotic mental disorder, unspecified: Secondary | ICD-10-CM

## 2022-06-24 NOTE — Telephone Encounter (Signed)
Please schedule appt

## 2022-06-24 NOTE — Progress Notes (Signed)
Patient no show appointment. ? ?

## 2022-07-04 NOTE — Telephone Encounter (Signed)
Spoke to pt to schedule appt; she said she would call back to schedule

## 2022-07-24 ENCOUNTER — Telehealth: Payer: Self-pay | Admitting: Adult Health

## 2022-07-24 ENCOUNTER — Telehealth: Payer: Self-pay

## 2022-07-24 NOTE — Telephone Encounter (Signed)
Initiated PA but patient has no RF and was a no show for her appt.

## 2022-07-24 NOTE — Telephone Encounter (Signed)
Laura Perez called at 1:15 to report that her Laura Perez needs a PA.  IF you have not already started one, please initiate one. She verified that the insurance we have is correct.

## 2022-07-27 NOTE — Telephone Encounter (Signed)
Prior Authorization Lisdexamfetamine Dimesylate '70MG'$  capsules #30/30 [employer]  Approved Effective:   07/27/2022 12:00:00 AM, Coverage Ends on: 07/27/2023 12:00:00 AM

## 2022-10-01 ENCOUNTER — Other Ambulatory Visit: Payer: Self-pay | Admitting: Adult Health

## 2022-10-01 DIAGNOSIS — F321 Major depressive disorder, single episode, moderate: Secondary | ICD-10-CM

## 2022-10-03 ENCOUNTER — Other Ambulatory Visit: Payer: Self-pay

## 2022-10-03 ENCOUNTER — Telehealth: Payer: Self-pay | Admitting: Adult Health

## 2022-10-03 DIAGNOSIS — F321 Major depressive disorder, single episode, moderate: Secondary | ICD-10-CM

## 2022-10-03 DIAGNOSIS — F902 Attention-deficit hyperactivity disorder, combined type: Secondary | ICD-10-CM

## 2022-10-03 MED ORDER — CLONAZEPAM 0.5 MG PO TABS: 0.5000 mg | ORAL_TABLET | Freq: Two times a day (BID) | ORAL | 0 refills | Status: DC

## 2022-10-03 MED ORDER — LISDEXAMFETAMINE DIMESYLATE 70 MG PO CAPS: 70.0000 mg | ORAL_CAPSULE | Freq: Every day | ORAL | 0 refills | Status: DC

## 2022-10-03 MED ORDER — CLOMIPRAMINE HCL 50 MG PO CAPS: ORAL_CAPSULE | ORAL | 0 refills | Status: DC

## 2022-10-03 NOTE — Telephone Encounter (Signed)
We can send enough until appointment.

## 2022-10-03 NOTE — Telephone Encounter (Signed)
Pt called and made an appt 5/23. She needs refills on her klonopin, vyvanse 70 mg and  clompramine 50 mg. Pharmacy is [facility] [address]

## 2022-10-03 NOTE — Telephone Encounter (Signed)
Sent/pended 

## 2022-10-16 ENCOUNTER — Encounter: Payer: Self-pay | Admitting: Adult Health

## 2022-10-16 ENCOUNTER — Ambulatory Visit (INDEPENDENT_AMBULATORY_CARE_PROVIDER_SITE_OTHER): Payer: Medicaid Other | Admitting: Adult Health

## 2022-10-16 DIAGNOSIS — Z79899 Other long term (current) drug therapy: Secondary | ICD-10-CM | POA: Diagnosis not present

## 2022-10-16 DIAGNOSIS — F5081 Binge eating disorder: Secondary | ICD-10-CM

## 2022-10-16 DIAGNOSIS — G4700 Insomnia, unspecified: Secondary | ICD-10-CM

## 2022-10-16 DIAGNOSIS — F902 Attention-deficit hyperactivity disorder, combined type: Secondary | ICD-10-CM | POA: Diagnosis not present

## 2022-10-16 DIAGNOSIS — F410 Panic disorder [episodic paroxysmal anxiety] without agoraphobia: Secondary | ICD-10-CM

## 2022-10-16 DIAGNOSIS — F321 Major depressive disorder, single episode, moderate: Secondary | ICD-10-CM | POA: Diagnosis not present

## 2022-10-16 DIAGNOSIS — F411 Generalized anxiety disorder: Secondary | ICD-10-CM

## 2022-10-16 DIAGNOSIS — F50819 Binge eating disorder, unspecified: Secondary | ICD-10-CM

## 2022-10-16 MED ORDER — CLONAZEPAM 0.5 MG PO TABS: 0.5000 mg | ORAL_TABLET | Freq: Two times a day (BID) | ORAL | 2 refills | Status: DC

## 2022-10-16 MED ORDER — CLOMIPRAMINE HCL 50 MG PO CAPS: ORAL_CAPSULE | ORAL | 2 refills | Status: DC

## 2022-10-16 MED ORDER — LISDEXAMFETAMINE DIMESYLATE 70 MG PO CAPS: 70.0000 mg | ORAL_CAPSULE | Freq: Every day | ORAL | 0 refills | Status: DC

## 2022-10-16 MED ORDER — FLUOXETINE HCL 40 MG PO CAPS: 40.0000 mg | ORAL_CAPSULE | Freq: Every day | ORAL | 5 refills | Status: DC

## 2022-10-16 MED ORDER — PROPRANOLOL HCL 10 MG PO TABS: 10.0000 mg | ORAL_TABLET | Freq: Three times a day (TID) | ORAL | 5 refills | Status: DC | PRN

## 2022-10-16 NOTE — Progress Notes (Signed)
Laura Perez [id number] 14-May-1991 32 y.o.  Subjective:   Patient ID:  Laura Perez is a 32 y.o. (DOB [birth year]) female.  Chief Complaint: No chief complaint on file.   HPI Laura Perez presents to the office today for follow-up of ADHD, MDD, BED, panic disorder, emotional lability, and physiological insomnia.  Describes mood today as "ok". Pleasant. Denies tearfulness. Mood symptoms - denies anxiety, depression and irritability. Denies panic attacks. Reports some worry. Denies rumination and over thinking. Reports some obsessive thoughts - worried about a neighbor breaking into her house - worked through it. Mood has been stable.Stating "I feel like I'm doing good". Feels like medications are helpful Mother/sister have recovered from kidney transplant. Reports a trip to [place] to visit niece. Improved interest and motivation. Taking medications as prescribed.  Energy levels pretty good. Active, does not have a regular exercise routine. Walking 6 to 7000 steps a day. Enjoys some usual interests and activities. Dating. Has a boyfriend. Lives with parents - 2 dogs and 2 cats. Spending time with family and her best friends.  Appetite adequate. Weight loss - 248 from 250 pounds. Sleeps better some nights than others. Averages 7 to 8 hours - using CPAP machine. Focus and concentration stable. Completing tasks. Managing aspects of household. Working at  [employer] 20 to 25 hours. Denies SI or HI.  Denies AH or VH. Denies self harm. Denies substance use.  Previous medication trials: Zoloft, lamictal, Celexa, Elavil  Has not tried - Prozac, Effexor, lexapro, Clomiprimine, Abilify, Rexulti, Seroquel, Latuda, Buspar   PHQ2-9    Flowsheet Row Office Visit from 05/13/2016 in [facility] Total Score 4  PHQ-9 Total Score 16      Flowsheet Row ED from 05/23/2022 in [facility] Emergency Department at [facility] ED from 04/30/2022  in [facility] Emergency Department at [facility] ED from 04/23/2021 in [facility] Emergency Department at [facility]  C-SSRS RISK CATEGORY No Risk No Risk No Risk        Review of Systems:  Review of Systems  Musculoskeletal:  Negative for gait problem.  Neurological:  Negative for tremors.  Psychiatric/Behavioral:         Please refer to HPI    Medications: I have reviewed the patient's current medications.  Current Outpatient Medications  Medication Sig Dispense Refill   beclomethasone (QVAR) 80 MCG/ACT inhaler Inhale 1 puff into the lungs daily as needed (wheezing).     clomiPRAMINE (ANAFRANIL) 50 MG capsule 1 capsule at bedtime 30 capsule 0   clonazePAM (KLONOPIN) 0.5 MG tablet Take 1 tablet (0.5 mg total) by mouth 2 (two) times daily. 28 tablet 0   dextroamphetamine (DEXTROSTAT) 10 MG tablet Take 1 tablet (10 mg total) by mouth daily. 30 tablet 0   diclofenac (VOLTAREN) 75 MG EC tablet Take 1 tablet (75 mg total) by mouth 2 (two) times daily. (Patient not taking: No sig reported) 60 tablet 1   dicyclomine (BENTYL) 20 MG tablet Take 1 tablet (20 mg total) by mouth 2 (two) times daily. 20 tablet 0   FLUoxetine (PROZAC) 40 MG capsule Take 1 capsule (40 mg total) by mouth at bedtime. 30 capsule 5   hydrocortisone (ANUSOL-HC) 25 MG suppository Place 1 suppository (25 mg total) rectally 2 (two) times daily. 12 suppository 0   Ibuprofen-diphenhydrAMINE Cit (ADVIL PM PO) Take 2 tablets by mouth daily as needed (pain).     lisdexamfetamine (VYVANSE) 70 MG capsule Take 1  capsule (70 mg total) by mouth daily. 30 capsule 0   lisdexamfetamine (VYVANSE) 70 MG capsule Take 1 capsule (70 mg total) by mouth daily. 30 capsule 0   lisdexamfetamine (VYVANSE) 70 MG capsule Take 1 capsule (70 mg total) by mouth daily. 14 capsule 0   loratadine (CLARITIN) 10 MG tablet Take 1 tablet (10 mg total) by mouth daily. 15 tablet 0   metFORMIN (GLUCOPHAGE-XR) 500 MG 24 hr tablet Take 1,000 mg  by mouth 2 (two) times daily.     metoCLOPramide (REGLAN) 10 MG tablet Take 1 tablet (10 mg total) by mouth every 6 (six) hours. 8 tablet 0   NIKKI 3-0.02 MG tablet Take 1 tablet by mouth daily.     NURTEC 75 MG TBDP Take 1 tablet by mouth daily as needed.     ondansetron (ZOFRAN ODT) 4 MG disintegrating tablet Take one tab by mouth Q6hr prn nausea (Dissolve under tongue) (Patient taking differently: Take 4 mg by mouth every 8 (eight) hours as needed for nausea or vomiting.) 12 tablet 0   OZEMPIC, 0.25 OR 0.5 MG/DOSE, 2 MG/1.5ML SOPN Inject 0.5 mg into the skin once a week. Friday     pantoprazole (PROTONIX) 20 MG tablet Take 1 tablet (20 mg total) by mouth daily. 20 tablet 0   propranolol (INDERAL) 10 MG tablet TAKE 1 TABLET BY MOUTH THREE TIMES A DAY AS NEEDED 90 tablet 0   topiramate (TOPAMAX) 25 MG tablet Take 25 mg by mouth 2 (two) times daily.     VIBERZI 100 MG TABS Take 1 tablet by mouth 2 (two) times daily.     No current facility-administered medications for this visit.    Medication Side Effects: None  Allergies:  Allergies  Allergen Reactions   Imitrex [Sumatriptan] Shortness Of Breath    Past Medical History:  Diagnosis Date   ADHD    Anxiety    Asthma    Depression    GERD (gastroesophageal reflux disease)    IBS (irritable bowel syndrome)    PCOS (polycystic ovarian syndrome)     Past Medical History, Surgical history, Social history, and Family history were reviewed and updated as appropriate.   Please see review of systems for further details on the patient's review from today.   Objective:   Physical Exam:  There were no vitals taken for this visit.  Physical Exam Constitutional:      General: She is not in acute distress. Musculoskeletal:        General: No deformity.  Neurological:     Mental Status: She is alert and oriented to person, place, and time.     Coordination: Coordination normal.  Psychiatric:        Attention and Perception:  Attention and perception normal. She does not perceive auditory or visual hallucinations.        Mood and Affect: Mood normal. Mood is not anxious or depressed. Affect is not labile, blunt, angry or inappropriate.        Speech: Speech normal.        Behavior: Behavior normal.        Thought Content: Thought content normal. Thought content is not paranoid or delusional. Thought content does not include homicidal or suicidal ideation. Thought content does not include homicidal or suicidal plan.        Cognition and Memory: Cognition and memory normal.        Judgment: Judgment normal.     Comments: Insight intact  Lab Review:     Component Value Date/Time   NA 138 05/23/2022 1530   NA 139 03/05/2017 1055   K 4.4 05/23/2022 1530   CL 109 05/23/2022 1530   CO2 21 (L) 05/23/2022 1530   GLUCOSE 93 05/23/2022 1530   BUN 13 05/23/2022 1530   BUN 14 03/05/2017 1055   CREATININE 0.94 05/23/2022 1530   CALCIUM 9.8 05/23/2022 1530   PROT 7.7 05/23/2022 1530   PROT 6.9 03/05/2017 1055   ALBUMIN 3.4 (L) 05/23/2022 1530   ALBUMIN 4.2 03/05/2017 1055   AST 23 05/23/2022 1530   ALT 16 05/23/2022 1530   ALKPHOS 67 05/23/2022 1530   BILITOT 0.4 05/23/2022 1530   BILITOT 0.3 03/05/2017 1055   GFRNONAA >60 05/23/2022 1530   GFRAA 123 03/05/2017 1055       Component Value Date/Time   WBC 10.1 05/23/2022 1530   RBC 4.66 05/23/2022 1530   HGB 13.1 05/23/2022 1530   HGB 12.5 05/15/2016 1156   HCT 41.4 05/23/2022 1530   HCT 38.4 05/15/2016 1156   PLT 309 05/23/2022 1530   MCV 88.8 05/23/2022 1530   MCV 85 05/15/2016 1156   MCH 28.1 05/23/2022 1530   MCHC 31.6 05/23/2022 1530   RDW 14.1 05/23/2022 1530   RDW 14.1 05/15/2016 1156   LYMPHSABS 2.9 05/23/2022 1530   LYMPHSABS 2.9 05/15/2016 1156   MONOABS 0.5 05/23/2022 1530   EOSABS 0.2 05/23/2022 1530   EOSABS 0.1 05/15/2016 1156   BASOSABS 0.1 05/23/2022 1530   BASOSABS 0.1 05/15/2016 1156    No results found for: "POCLITH",  "LITHIUM"   No results found for: "PHENYTOIN", "PHENOBARB", "VALPROATE", "CBMZ"   .res Assessment: Plan:    Plan:  1. Clopimramine 50mg  at hs 2. Prozac 40mg  daily 3. Propranolol 10mg  TID 4. Clonazepam 0.5mg  - 2 at hs 5. Vyvanse 70mg  every morning  Clomiramine level - lab slip given   6 months  Discussed potential benefits, risks, and side effects of stimulants with patient to include increased heart rate, palpitations, insomnia, increased anxiety, increased irritability, or decreased appetite.  Instructed patient to contact office if experiencing any significant tolerability issues.  Discussed potential benefits, risk, and side effects of benzodiazepines to include potential risk of tolerance and dependence, as well as possible drowsiness.  Advised patient not to drive if experiencing drowsiness and to take lowest possible effective dose to minimize risk of dependence and tolerance.  Patient advised to contact office with any questions, adverse effects, or acute worsening in signs and symptoms. There are no diagnoses linked to this encounter.   Please see After Visit Summary for patient specific instructions.  Future Appointments  Date Time Provider Department Center  10/16/2022  3:40 PM Chett Taniguchi, Thereasa Solo, NP CP-CP None    No orders of the defined types were placed in this encounter.   -------------------------------

## 2022-10-31 ENCOUNTER — Telehealth: Payer: Self-pay | Admitting: Adult Health

## 2022-10-31 ENCOUNTER — Other Ambulatory Visit: Payer: Self-pay | Admitting: Adult Health

## 2022-10-31 DIAGNOSIS — F902 Attention-deficit hyperactivity disorder, combined type: Secondary | ICD-10-CM

## 2022-10-31 MED ORDER — LISDEXAMFETAMINE DIMESYLATE 50 MG PO CAPS: 50.0000 mg | ORAL_CAPSULE | Freq: Every day | ORAL | 0 refills | Status: DC

## 2022-10-31 NOTE — Telephone Encounter (Signed)
Which pharmacy?

## 2022-10-31 NOTE — Telephone Encounter (Signed)
Pt LVM @ 12:21p.  She would like refill of Vyvanse 50mg .  It is her May script.  The pharmacy has not been able to get 70mg  in stock.  Next appt 11/22

## 2022-10-31 NOTE — Telephone Encounter (Signed)
Script sent  

## 2022-10-31 NOTE — Telephone Encounter (Signed)
Sorry I had to call her back to ask her that and then I forgot to add it to the message  [facility] [id number] Gin[place] Otto, [place] - [address] [place] [address] Phone: [phone]  Fax: [phone]

## 2022-11-06 ENCOUNTER — Telehealth: Payer: Self-pay | Admitting: Adult Health

## 2022-11-06 NOTE — Telephone Encounter (Signed)
Pt called at 9:03a.  She said she had "a panic attack with crying and everything last night." She was trying to see Almira Coaster today or tomorrow, but there are no openings.  She is asking if Almira Coaster might increase her meds.  Next appt 11/22

## 2022-11-06 NOTE — Telephone Encounter (Signed)
Patient reporting she was very overwhelmed yesterday and didn't sleep last night, which caused panic sx today. She does report that she has felt increased anxiety (6/10) the last few days.  She reports parents out of town, which they aren't very often. She also reports increased stress with job. She was seen last month and things were going well, seems to be situational at this time.   She is on clonazepam 0.5 mg BID.

## 2022-11-06 NOTE — Telephone Encounter (Signed)
Please call to schedule an earlier appt with Laura Perez.  

## 2022-11-06 NOTE — Telephone Encounter (Deleted)
Please call to schedule an earlier appt with Gina.  

## 2022-11-06 NOTE — Telephone Encounter (Signed)
Would she like to come in for an appointment/ She can take an extra Clonazepam if that is helpful.

## 2022-11-11 ENCOUNTER — Encounter: Payer: Self-pay | Admitting: Adult Health

## 2022-11-11 ENCOUNTER — Ambulatory Visit (INDEPENDENT_AMBULATORY_CARE_PROVIDER_SITE_OTHER): Payer: Medicaid Other | Admitting: Adult Health

## 2022-11-11 DIAGNOSIS — F411 Generalized anxiety disorder: Secondary | ICD-10-CM | POA: Diagnosis not present

## 2022-11-11 DIAGNOSIS — F902 Attention-deficit hyperactivity disorder, combined type: Secondary | ICD-10-CM

## 2022-11-11 DIAGNOSIS — F410 Panic disorder [episodic paroxysmal anxiety] without agoraphobia: Secondary | ICD-10-CM | POA: Diagnosis not present

## 2022-11-11 DIAGNOSIS — F5081 Binge eating disorder: Secondary | ICD-10-CM

## 2022-11-11 DIAGNOSIS — F321 Major depressive disorder, single episode, moderate: Secondary | ICD-10-CM

## 2022-11-11 NOTE — Progress Notes (Signed)
Laura Perez [id number] February 22, 1991 32 y.o.  Subjective:   Patient ID:  Laura Perez is a 32 y.o. (DOB August 08, 1990) female.  Chief Complaint: No chief complaint on file.   HPI Laura Perez presents to the office today for follow-up of ADHD, MDD, BED, panic disorder, emotional lability, and physiological insomnia.  Describes mood today as "not the best". Pleasant. Denies tearfulness. Mood symptoms - reports some recent anxiety, depression and irritability - related to increased situational stressors. Reports recent panic attacks. Reports increased worry, rumination and over thinking. Reports some obsessive thoughts. Mood has been variable. Stating "I haven't been doing too good". Reports an incident in the work setting that has contributed to mood instability. Reports being at work and a female customer came into the women's bathroom when she was in there and she felt afraid. She asked the man to leave, he did, and she was able to exit the bathroom safely. She reported the incident to her manager. Reports she is still experiencing some negative thoughts surrounding the incident. She does have support of family and friends. Feels like medications are helpful. Stable interest and motivation. Taking medications as prescribed.  Energy levels pretty good. Active, does not have a regular exercise routine. Walking 6 to 7000 steps a day. Enjoys some usual interests and activities. Dating. Has a boyfriend. Lives with parents - 2 dogs and 2 cats. Spending time with family and her best friends.  Appetite adequate. Weight loss - 248 from 250 pounds. Sleeps better some nights than others. Averages 7 to 8 hours - using CPAP machine. Focus and concentration stable. Completing tasks. Managing aspects of household. Working at  [employer] 20 to 25 hours.   Denies SI or HI.  Denies AH or VH. Denies self harm. Denies substance use.  Previous medication trials: Zoloft, lamictal, Celexa, Elavil  Has not tried  - Prozac, Effexor, lexapro, Clomiprimine, Abilify, Rexulti, Seroquel, Latuda, Buspar   PHQ2-9    Flowsheet Row Office Visit from 05/13/2016 in [facility] Total Score 4  PHQ-9 Total Score 16      Flowsheet Row ED from 05/23/2022 in [facility] at [facility] from 04/30/2022 in [facility] Department at [facility] ED from 04/23/2021 in [facility] Department at [facility]  C-SSRS RISK CATEGORY No Risk No Risk No Risk        Review of Systems:  Review of Systems  Musculoskeletal:  Negative for gait problem.  Neurological:  Negative for tremors.  Psychiatric/Behavioral:         Please refer to HPI    Medications: I have reviewed the patient's current medications.  Current Outpatient Medications  Medication Sig Dispense Refill   beclomethasone (QVAR) 80 MCG/ACT inhaler Inhale 1 puff into the lungs daily as needed (wheezing).     clomiPRAMINE (ANAFRANIL) 50 MG capsule 1 capsule at bedtime 30 capsule 2   clonazePAM (KLONOPIN) 0.5 MG tablet Take 1 tablet (0.5 mg total) by mouth 2 (two) times daily. 60 tablet 2   dextroamphetamine (DEXTROSTAT) 10 MG tablet Take 1 tablet (10 mg total) by mouth daily. 30 tablet 0   diclofenac (VOLTAREN) 75 MG EC tablet Take 1 tablet (75 mg total) by mouth 2 (two) times daily. (Patient not taking: No sig reported) 60 tablet 1   dicyclomine (BENTYL) 20 MG tablet Take 1 tablet (20 mg total) by mouth 2 (two) times daily. 20 tablet 0   FLUoxetine (PROZAC)  40 MG capsule Take 1 capsule (40 mg total) by mouth at bedtime. 30 capsule 5   hydrocortisone (ANUSOL-HC) 25 MG suppository Place 1 suppository (25 mg total) rectally 2 (two) times daily. 12 suppository 0   Ibuprofen-diphenhydrAMINE Cit (ADVIL PM PO) Take 2 tablets by mouth daily as needed (pain).     lisdexamfetamine (VYVANSE) 50 MG capsule Take 1 capsule (50 mg total) by mouth daily. 30 capsule  0   [START ON 11/13/2022] lisdexamfetamine (VYVANSE) 70 MG capsule Take 1 capsule (70 mg total) by mouth daily. 30 capsule 0   [START ON 12/11/2022] lisdexamfetamine (VYVANSE) 70 MG capsule Take 1 capsule (70 mg total) by mouth daily. 30 capsule 0   loratadine (CLARITIN) 10 MG tablet Take 1 tablet (10 mg total) by mouth daily. 15 tablet 0   metFORMIN (GLUCOPHAGE-XR) 500 MG 24 hr tablet Take 1,000 mg by mouth 2 (two) times daily.     metoCLOPramide (REGLAN) 10 MG tablet Take 1 tablet (10 mg total) by mouth every 6 (six) hours. 8 tablet 0   NIKKI 3-0.02 MG tablet Take 1 tablet by mouth daily.     NURTEC 75 MG TBDP Take 1 tablet by mouth daily as needed.     ondansetron (ZOFRAN ODT) 4 MG disintegrating tablet Take one tab by mouth Q6hr prn nausea (Dissolve under tongue) (Patient taking differently: Take 4 mg by mouth every 8 (eight) hours as needed for nausea or vomiting.) 12 tablet 0   OZEMPIC, 0.25 OR 0.5 MG/DOSE, 2 MG/1.5ML SOPN Inject 0.5 mg into the skin once a week. Friday     pantoprazole (PROTONIX) 20 MG tablet Take 1 tablet (20 mg total) by mouth daily. 20 tablet 0   propranolol (INDERAL) 10 MG tablet Take 1 tablet (10 mg total) by mouth 3 (three) times daily as needed. 90 tablet 5   topiramate (TOPAMAX) 25 MG tablet Take 25 mg by mouth 2 (two) times daily.     VIBERZI 100 MG TABS Take 1 tablet by mouth 2 (two) times daily.     No current facility-administered medications for this visit.    Medication Side Effects: None  Allergies:  Allergies  Allergen Reactions   Imitrex [Sumatriptan] Shortness Of Breath    Past Medical History:  Diagnosis Date   ADHD    Anxiety    Asthma    Depression    GERD (gastroesophageal reflux disease)    IBS (irritable bowel syndrome)    PCOS (polycystic ovarian syndrome)     Past Medical History, Surgical history, Social history, and Family history were reviewed and updated as appropriate.   Please see review of systems for further details on the  patient's review from today.   Objective:   Physical Exam:  There were no vitals taken for this visit.  Physical Exam Constitutional:      General: She is not in acute distress. Musculoskeletal:        General: No deformity.  Neurological:     Mental Status: She is alert and oriented to person, place, and time.     Coordination: Coordination normal.  Psychiatric:        Attention and Perception: Attention and perception normal. She does not perceive auditory or visual hallucinations.        Mood and Affect: Mood normal. Mood is not anxious or depressed. Affect is not labile, blunt, angry or inappropriate.        Speech: Speech normal.        Behavior: Behavior  normal.        Thought Content: Thought content normal. Thought content is not paranoid or delusional. Thought content does not include homicidal or suicidal ideation. Thought content does not include homicidal or suicidal plan.        Cognition and Memory: Cognition and memory normal.        Judgment: Judgment normal.     Comments: Insight intact     Lab Review:     Component Value Date/Time   NA 138 05/23/2022 1530   NA 139 03/05/2017 1055   K 4.4 05/23/2022 1530   CL 109 05/23/2022 1530   CO2 21 (L) 05/23/2022 1530   GLUCOSE 93 05/23/2022 1530   BUN 13 05/23/2022 1530   BUN 14 03/05/2017 1055   CREATININE 0.94 05/23/2022 1530   CALCIUM 9.8 05/23/2022 1530   PROT 7.7 05/23/2022 1530   PROT 6.9 03/05/2017 1055   ALBUMIN 3.4 (L) 05/23/2022 1530   ALBUMIN 4.2 03/05/2017 1055   AST 23 05/23/2022 1530   ALT 16 05/23/2022 1530   ALKPHOS 67 05/23/2022 1530   BILITOT 0.4 05/23/2022 1530   BILITOT 0.3 03/05/2017 1055   GFRNONAA >60 05/23/2022 1530   GFRAA 123 03/05/2017 1055       Component Value Date/Time   WBC 10.1 05/23/2022 1530   RBC 4.66 05/23/2022 1530   HGB 13.1 05/23/2022 1530   HGB 12.5 05/15/2016 1156   HCT 41.4 05/23/2022 1530   HCT 38.4 05/15/2016 1156   PLT 309 05/23/2022 1530   MCV 88.8  05/23/2022 1530   MCV 85 05/15/2016 1156   MCH 28.1 05/23/2022 1530   MCHC 31.6 05/23/2022 1530   RDW 14.1 05/23/2022 1530   RDW 14.1 05/15/2016 1156   LYMPHSABS 2.9 05/23/2022 1530   LYMPHSABS 2.9 05/15/2016 1156   MONOABS 0.5 05/23/2022 1530   EOSABS 0.2 05/23/2022 1530   EOSABS 0.1 05/15/2016 1156   BASOSABS 0.1 05/23/2022 1530   BASOSABS 0.1 05/15/2016 1156    No results found for: "POCLITH", "LITHIUM"   No results found for: "PHENYTOIN", "PHENOBARB", "VALPROATE", "CBMZ"   .res Assessment: Plan:    Plan:  1. Clopimramine 50mg  at hs 2. Prozac 40mg  daily 3. Propranolol 10mg  TID 4. Clonazepam 0.5mg  - 2 at hs 5. Vyvanse 70mg  every morning  Clomiramine level - lab slip given   6 months  Discussed potential benefits, risks, and side effects of stimulants with patient to include increased heart rate, palpitations, insomnia, increased anxiety, increased irritability, or decreased appetite.  Instructed patient to contact office if experiencing any significant tolerability issues.  Discussed potential benefits, risk, and side effects of benzodiazepines to include potential risk of tolerance and dependence, as well as possible drowsiness.  Advised patient not to drive if experiencing drowsiness and to take lowest possible effective dose to minimize risk of dependence and tolerance.  Patient advised to contact office with any questions, adverse effects, or acute worsening in signs and symptoms. Diagnoses and all orders for this visit:  Moderate major depression, single episode (HCC)  Attention deficit hyperactivity disorder (ADHD), combined type, moderate  Panic disorder  Generalized anxiety disorder  Binge eating disorder     Please see After Visit Summary for patient specific instructions.  Future Appointments  Date Time Provider Department Center  04/17/2023  2:40 PM Zyairah Wacha, Thereasa Solo, NP CP-CP None    No orders of the defined types were placed in this  encounter.   -------------------------------

## 2023-02-03 ENCOUNTER — Other Ambulatory Visit: Payer: Self-pay | Admitting: Adult Health

## 2023-02-03 DIAGNOSIS — F321 Major depressive disorder, single episode, moderate: Secondary | ICD-10-CM

## 2023-02-03 NOTE — Telephone Encounter (Signed)
Due 9/11

## 2023-03-23 ENCOUNTER — Other Ambulatory Visit (HOSPITAL_BASED_OUTPATIENT_CLINIC_OR_DEPARTMENT_OTHER): Payer: Self-pay

## 2023-03-23 ENCOUNTER — Encounter (HOSPITAL_BASED_OUTPATIENT_CLINIC_OR_DEPARTMENT_OTHER): Payer: Self-pay

## 2023-03-23 ENCOUNTER — Emergency Department (HOSPITAL_BASED_OUTPATIENT_CLINIC_OR_DEPARTMENT_OTHER)
Admission: EM | Admit: 2023-03-23 | Discharge: 2023-03-23 | Disposition: A | Discharge status: Home / Self Care | Payer: Medicaid Other | Attending: Emergency Medicine | Admitting: Emergency Medicine

## 2023-03-23 DIAGNOSIS — Z6837 Body mass index (BMI) 37.0-37.9, adult: Secondary | ICD-10-CM | POA: Insufficient documentation

## 2023-03-23 DIAGNOSIS — R110 Nausea: Secondary | ICD-10-CM | POA: Insufficient documentation

## 2023-03-23 DIAGNOSIS — J45909 Unspecified asthma, uncomplicated: Secondary | ICD-10-CM | POA: Diagnosis not present

## 2023-03-23 DIAGNOSIS — R1013 Epigastric pain: Secondary | ICD-10-CM | POA: Diagnosis present

## 2023-03-23 DIAGNOSIS — E669 Obesity, unspecified: Secondary | ICD-10-CM | POA: Diagnosis not present

## 2023-03-23 LAB — COMPREHENSIVE METABOLIC PANEL
ALT: 10 U/L (ref 0–44)
AST: 13 U/L — ABNORMAL LOW (ref 15–41)
Albumin: 4.1 g/dL (ref 3.5–5.0)
Alkaline Phosphatase: 74 U/L (ref 38–126)
Anion gap: 9 (ref 5–15)
BUN: 13 mg/dL (ref 6–20)
CO2: 20 mmol/L — ABNORMAL LOW (ref 22–32)
Calcium: 9.5 mg/dL (ref 8.9–10.3)
Chloride: 107 mmol/L (ref 98–111)
Creatinine, Ser: 0.88 mg/dL (ref 0.44–1.00)
GFR, Estimated: >60 mL/min (ref >60)
Glucose, Bld: 89 mg/dL (ref 70–99)
Potassium: 4.2 mmol/L (ref 3.5–5.1)
Sodium: 136 mmol/L (ref 135–145)
Total Bilirubin: 0.3 mg/dL (ref 0.3–1.2)
Total Protein: 7.1 g/dL (ref 6.5–8.1)

## 2023-03-23 LAB — CBC WITH DIFFERENTIAL/PLATELET
Abs Immature Granulocytes: 0.03 10*3/uL (ref 0.00–0.07)
Basophils Absolute: 0.1 10*3/uL (ref 0.0–0.1)
Basophils Relative: 1 %
Eosinophils Absolute: 0.1 10*3/uL (ref 0.0–0.5)
Eosinophils Relative: 1 %
HCT: 41.9 % (ref 36.0–46.0)
Hemoglobin: 13.8 g/dL (ref 12.0–15.0)
Immature Granulocytes: 0 %
Lymphocytes Relative: 35 %
Lymphs Abs: 3 10*3/uL (ref 0.7–4.0)
MCH: 28.8 pg (ref 26.0–34.0)
MCHC: 32.9 g/dL (ref 30.0–36.0)
MCV: 87.3 fL (ref 80.0–100.0)
Monocytes Absolute: 0.5 10*3/uL (ref 0.1–1.0)
Monocytes Relative: 6 %
Neutro Abs: 4.9 10*3/uL (ref 1.7–7.7)
Neutrophils Relative %: 57 %
Platelets: 272 10*3/uL (ref 150–400)
RBC: 4.8 MIL/uL (ref 3.87–5.11)
RDW: 14.2 % (ref 11.5–15.5)
WBC: 8.6 10*3/uL (ref 4.0–10.5)
nRBC: 0 % (ref 0.0–0.2)

## 2023-03-23 LAB — URINALYSIS, ROUTINE W REFLEX MICROSCOPIC
Bilirubin Urine: NEGATIVE
Glucose, UA: NEGATIVE mg/dL
Hgb urine dipstick: NEGATIVE
Ketones, ur: NEGATIVE mg/dL
Nitrite: NEGATIVE
Specific Gravity, Urine: 1.029 (ref 1.005–1.030)
pH: 6.5 (ref 5.0–8.0)

## 2023-03-23 LAB — LIPASE, BLOOD: Lipase: 43 U/L (ref 11–51)

## 2023-03-23 LAB — PREGNANCY, URINE: Preg Test, Ur: NEGATIVE

## 2023-03-23 MED ORDER — METOCLOPRAMIDE HCL 5 MG/ML IJ SOLN
10.0000 mg | Freq: Once | INTRAMUSCULAR | Status: AC
  Administered 2023-03-23: 10 mg via INTRAVENOUS
  Filled 2023-03-23: qty 2

## 2023-03-23 MED ORDER — MORPHINE SULFATE (PF) 4 MG/ML IV SOLN
4.0000 mg | Freq: Once | INTRAVENOUS | Status: AC
  Administered 2023-03-23: 4 mg via INTRAVENOUS
  Filled 2023-03-23: qty 1

## 2023-03-23 MED ORDER — DICYCLOMINE HCL 20 MG PO TABS
20.0000 mg | ORAL_TABLET | Freq: Two times a day (BID) | ORAL | 0 refills | Status: AC
Start: 2023-03-23 — End: ?
  Filled 2023-03-23: qty 20, 10d supply, fill #0

## 2023-03-23 MED ORDER — ALUM & MAG HYDROXIDE-SIMETH 200-200-20 MG/5ML PO SUSP
30.0000 mL | Freq: Once | ORAL | Status: AC
  Administered 2023-03-23: 30 mL via ORAL
  Filled 2023-03-23: qty 30

## 2023-03-23 MED ORDER — DICYCLOMINE HCL 10 MG PO CAPS
10.0000 mg | ORAL_CAPSULE | Freq: Once | ORAL | Status: AC
  Administered 2023-03-23: 10 mg via ORAL
  Filled 2023-03-23: qty 1

## 2023-03-23 NOTE — ED Notes (Signed)
Unsuccessful blood collection, RT hand. Pt tolerated well. 2nd attempt successful

## 2023-03-23 NOTE — ED Triage Notes (Signed)
Epigastric pain that started this morning.   Pt is on Wegovy and has hx of gallbladder issue.

## 2023-03-23 NOTE — Discharge Instructions (Addendum)
Thank you for allowing [place] to be a part of your care today.  You were evaluated in the ED for epigastric abdominal pain.  I suspect your symptoms may be related to [facility] which is causing delayed stomach emptying.  This can result in discomfort as well as reflux symptoms.  Continue taking your famotidine as prescribed.  You may also take 20 mL Mylanta 3 times daily as needed.   I have sent in a medication called dicyclomine (Bentyl).  This is an antispasmodic and will help with any bowel cramping you are experiencing.  This medication can cause constipation.  I recommend using MiraLAX or Colace stool softeners and increasing your water intake.  Please contact your doctor and schedule a follow-up appointment.  Return to the ED if you develop sudden worsening of your symptoms or if you have new concerns.

## 2023-03-23 NOTE — ED Provider Notes (Signed)
[place] EMERGENCY DEPARTMENT AT [facility] Provider Note   CSN: [id number] Arrival date & time: 03/23/23  2440     History  Chief Complaint  Patient presents with   Abdominal Pain    Laura Perez is a 32 y.o. female with past medical history significant for anxiety, IBS, ADHD, depression, asthma, PCOS, GERD presents to the ED complaining of epigastric pain that began this morning.  Patient states she is on [facility] for the past 8 weeks.  She states she began this medication last month but had to be taken off due to reflux and vomiting.  Patient's provider had her restart the medication and she takes famotidine and Zofran to manage side effects.  She states she has also felt bloated, constipated, and is having "sulfur burps".  Patient gives herself the shots on Sundays.  She is still on the 0.25mg  dose.  Denies chest pain, shortness of breath, fever, urinary symptoms, blood in stool.       Home Medications Prior to Admission medications   Medication Sig Start Date End Date Taking? Authorizing Provider  dicyclomine (BENTYL) 20 MG tablet Take 1 tablet (20 mg total) by mouth 2 (two) times daily. 03/23/23  Yes Izak Anding R, PA-C  beclomethasone (QVAR) 80 MCG/ACT inhaler Inhale 1 puff into the lungs daily as needed (wheezing). 05/11/15   [provider]  clomiPRAMINE (ANAFRANIL) 50 MG capsule 1 capsule at bedtime 10/16/22   Mozingo, Thereasa Solo, NP  clonazePAM (KLONOPIN) 0.5 MG tablet TAKE 1 TABLET BY MOUTH 2 TIMES A DAY 02/04/23   Mozingo, Thereasa Solo, NP  dextroamphetamine (DEXTROSTAT) 10 MG tablet Take 1 tablet (10 mg total) by mouth daily. 12/05/20   Mozingo, Thereasa Solo, NP  diclofenac (VOLTAREN) 75 MG EC tablet Take 1 tablet (75 mg total) by mouth 2 (two) times daily. Patient not taking: No sig reported 05/21/16   Carrington Clamp, DPM  FLUoxetine (PROZAC) 40 MG capsule Take 1 capsule (40 mg total) by mouth at bedtime. 10/16/22   Mozingo, Thereasa Solo, NP  hydrocortisone (ANUSOL-HC) 25 MG suppository Place 1 suppository (25 mg total) rectally 2 (two) times daily. 05/23/22   Charlynne Pander, MD  Ibuprofen-diphenhydrAMINE Cit (ADVIL PM PO) Take 2 tablets by mouth daily as needed (pain).    [provider]  lisdexamfetamine (VYVANSE) 50 MG capsule Take 1 capsule (50 mg total) by mouth daily. 10/31/22   Mozingo, Thereasa Solo, NP  lisdexamfetamine (VYVANSE) 70 MG capsule Take 1 capsule (70 mg total) by mouth daily. 11/13/22   Mozingo, Thereasa Solo, NP  lisdexamfetamine (VYVANSE) 70 MG capsule Take 1 capsule (70 mg total) by mouth daily. 12/11/22   Mozingo, Thereasa Solo, NP  loratadine (CLARITIN) 10 MG tablet Take 1 tablet (10 mg total) by mouth daily. 04/23/21   Palumbo, April, MD  metFORMIN (GLUCOPHAGE-XR) 500 MG 24 hr tablet Take 1,000 mg by mouth 2 (two) times daily. 03/11/22   [provider]  metoCLOPramide (REGLAN) 10 MG tablet Take 1 tablet (10 mg total) by mouth every 6 (six) hours. 12/07/20   Benjiman Core, MD  NIKKI 3-0.02 MG tablet Take 1 tablet by mouth daily. 06/02/20   [provider]  NURTEC 75 MG TBDP Take 1 tablet by mouth daily as needed. 02/06/22   [provider]  ondansetron (ZOFRAN ODT) 4 MG disintegrating tablet Take one tab by mouth Q6hr prn nausea (Dissolve under tongue) Patient taking differently: Take 4 mg by mouth every 8 (eight) hours as needed for  nausea or vomiting. 04/09/16   Lattie Haw, MD  OZEMPIC, 0.25 OR 0.5 MG/DOSE, 2 MG/1.5ML SOPN Inject 0.5 mg into the skin once a week. Friday 05/30/20   [provider]  pantoprazole (PROTONIX) 20 MG tablet Take 1 tablet (20 mg total) by mouth daily. 06/17/20   Bethann Berkshire, MD  propranolol (INDERAL) 10 MG tablet Take 1 tablet (10 mg total) by mouth 3 (three) times daily as needed. 10/16/22   Mozingo, Thereasa Solo, NP  topiramate (TOPAMAX) 25 MG tablet Take 25 mg by mouth 2 (two) times daily. 03/18/22   [provider]  VIBERZI 100 MG TABS Take 1 tablet by mouth 2 (two) times daily. 04/04/20   [provider]      Allergies    Imitrex [sumatriptan]    Review of Systems   Review of Systems  Constitutional:  Negative for fever.  Respiratory:  Negative for shortness of breath.   Cardiovascular:  Negative for chest pain.  Gastrointestinal:  Positive for abdominal distention, abdominal pain, constipation, nausea and vomiting. Negative for blood in stool and diarrhea.  Genitourinary:  Negative for decreased urine volume, difficulty urinating and dysuria.    Physical Exam Updated Vital Signs BP 112/74   Pulse 74   Temp 98.2 F (36.8 C) (Oral)   Resp 12   Ht 5\' 7"  (1.702 m)   Wt 108.9 kg   LMP 03/15/2023 (Approximate)   SpO2 98%   BMI 37.59 kg/m  Physical Exam Vitals and nursing note reviewed.  Constitutional:      General: She is not in acute distress.    Appearance: Normal appearance. She is obese. She is not ill-appearing or diaphoretic.     Comments: Patient appears somewhat uncomfortable.  HENT:     Mouth/Throat:     Lips: Pink.     Mouth: Mucous membranes are moist.  Cardiovascular:     Rate and Rhythm: Normal rate and regular rhythm.  Pulmonary:     Effort: Pulmonary effort is normal.  Abdominal:     General: Abdomen is flat. Bowel sounds are normal.     Palpations: Abdomen is soft.     Tenderness: There is abdominal tenderness in the epigastric area. There is no guarding or rebound. Negative signs include Murphy's sign.  Skin:    General: Skin is warm and dry.     Capillary Refill: Capillary refill takes less than 2 seconds.  Neurological:     Mental Status: She is alert. Mental status is at baseline.  Psychiatric:        Mood and Affect: Mood normal.        Behavior: Behavior normal.     ED Results / Procedures / Treatments   Labs (all labs ordered are listed, but only abnormal results are displayed) Labs Reviewed  URINALYSIS, ROUTINE W REFLEX  MICROSCOPIC - Abnormal; Notable for the following components:      Result Value   Protein, ur TRACE (*)    Leukocytes,Ua SMALL (*)    Bacteria, UA RARE (*)    All other components within normal limits  COMPREHENSIVE METABOLIC PANEL - Abnormal; Notable for the following components:   CO2 20 (*)    AST 13 (*)    All other components within normal limits  CBC WITH DIFFERENTIAL/PLATELET  PREGNANCY, URINE  LIPASE, BLOOD    EKG EKG Interpretation Date/Time:  Monday March 23 2023 09:28:17 EDT Ventricular Rate:  90 PR Interval:  146 QRS Duration:  88 QT Interval:  382 QTC Calculation: 468 R Axis:   49  Text Interpretation: Sinus rhythm Low voltage, precordial leads Borderline T abnormalities, anterior leads Baseline wander in lead(s) V3 Confirmed by Vonita Moss [id number]) on 03/23/2023 9:32:33 AM  Radiology No results found.  Procedures Procedures    Medications Ordered in ED Medications  metoCLOPramide (REGLAN) injection 10 mg (10 mg Intravenous Given 03/23/23 1021)  morphine (PF) 4 MG/ML injection 4 mg (4 mg Intravenous Given 03/23/23 1020)  alum & mag hydroxide-simeth (MAALOX/MYLANTA) 200-200-20 MG/5ML suspension 30 mL (30 mLs Oral Given 03/23/23 1148)  dicyclomine (BENTYL) capsule 10 mg (10 mg Oral Given 03/23/23 1148)    ED Course/ Medical Decision Making/ A&P                                 Medical Decision Making Amount and/or Complexity of Data Reviewed Labs: ordered.  Risk OTC drugs. Prescription drug management.   This patient presents to the ED with chief complaint(s) of epigastric abdominal pain, nausea with pertinent past medical history of obesity, GERD, IBS.  The complaint involves an extensive differential diagnosis and also carries with it a high risk of complications and morbidity.    The differential diagnosis includes gastroparesis, gastritis, PUD, pancreatitis    The initial plan is to obtain labs  Initial Assessment:   On exam, patient  does appear somewhat uncomfortable but is not in acute distress.  Abdomen is soft with epigastric tenderness.  Abdomen does not appear to be significantly distended.  Bowel sounds are normal.  No right upper quadrant tenderness, negative Murphy's sign.  Mucus membranes are moist.  Independent ECG/labs interpretation:  The following labs were independently interpreted:  CBC without leukocytosis or anemia.  Metabolic panel without major electrolyte disturbance.  Renal function and hepatic function are both normal.  UA without evidence of infection or significant dehydration.  Pregnancy negative.  Lipase not negative of pancreatitis.  Patient's workup so far is reassuring.  I do not feel that she requires imaging at this time.  Treatment and Reassessment: Patient was initially given Reglan and morphine to help with nausea and pain.  Patient did have a return of her pain rating it a 1010.  She was then given a GI cocktail consisting of Maalox and Bentyl.  Patient had complete resolution of her pain and reports feeling better.  Disposition:   Suspect patient's symptoms may be related to [facility] use as this delays gastric emptying and she has had difficulty with this medication since starting it.  Her workup today is overall reassuring.  Will send her home on Bentyl to help with bowel spasms.  Patient provided with information regarding a bowel regimen to help with constipation.  Patient is currently taking famotidine for reflux and uses Mylanta as needed.  Discussed with patient continuing these medications and speaking with her prescribing provider as she may require additional medications/changes in medication.   The patient has been appropriately medically screened and/or stabilized in the ED. I have low suspicion for any other emergent medical condition which would require further screening, evaluation or treatment in the ED or require inpatient management. At time of discharge the patient is  hemodynamically stable and in no acute distress. I have discussed work-up results and diagnosis with patient and answered all questions. Patient is agreeable with discharge plan. We discussed strict return precautions for returning to the emergency department and they verbalized understanding.  Final Clinical Impression(s) / ED Diagnoses Final diagnoses:  Epigastric abdominal pain  Nausea    Rx / DC Orders ED Discharge Orders          Ordered    dicyclomine (BENTYL) 20 MG tablet  2 times daily        03/23/23 [address], [place] 03/23/23 1335    Rondel Baton, MD 03/24/23 1358

## 2023-04-17 ENCOUNTER — Ambulatory Visit (INDEPENDENT_AMBULATORY_CARE_PROVIDER_SITE_OTHER): Payer: Medicaid Other | Admitting: Adult Health

## 2023-04-17 ENCOUNTER — Encounter: Payer: Self-pay | Admitting: Adult Health

## 2023-04-17 DIAGNOSIS — F411 Generalized anxiety disorder: Secondary | ICD-10-CM | POA: Diagnosis not present

## 2023-04-17 DIAGNOSIS — F902 Attention-deficit hyperactivity disorder, combined type: Secondary | ICD-10-CM

## 2023-04-17 DIAGNOSIS — F321 Major depressive disorder, single episode, moderate: Secondary | ICD-10-CM | POA: Diagnosis not present

## 2023-04-17 DIAGNOSIS — F410 Panic disorder [episodic paroxysmal anxiety] without agoraphobia: Secondary | ICD-10-CM | POA: Diagnosis not present

## 2023-04-17 LAB — CLOMIPRAMINE
Clomipramine.: 88 ug/L (ref 50–250)
Desemethylclomipramine: 22 ug/L — ABNORMAL LOW (ref 150–350)
Tot Clomipramine+Desmethylclomipramine: 110 ug/L — ABNORMAL LOW (ref 200–600)

## 2023-04-17 MED ORDER — FLUOXETINE HCL 40 MG PO CAPS: 40.0000 mg | ORAL_CAPSULE | Freq: Every day | ORAL | 5 refills | Status: DC

## 2023-04-17 MED ORDER — PROPRANOLOL HCL 10 MG PO TABS: 10.0000 mg | ORAL_TABLET | Freq: Three times a day (TID) | ORAL | 5 refills | Status: DC | PRN

## 2023-04-17 MED ORDER — OLANZAPINE 2.5 MG PO TABS: 2.5000 mg | ORAL_TABLET | Freq: Every day | ORAL | 2 refills | Status: DC

## 2023-04-17 MED ORDER — CLONAZEPAM 0.5 MG PO TABS: 0.5000 mg | ORAL_TABLET | Freq: Two times a day (BID) | ORAL | 2 refills | Status: DC

## 2023-04-17 MED ORDER — CLOMIPRAMINE HCL 50 MG PO CAPS: ORAL_CAPSULE | ORAL | 2 refills | Status: DC

## 2023-04-17 MED ORDER — DEXTROAMPHETAMINE SULFATE 10 MG PO TABS: 10.0000 mg | ORAL_TABLET | Freq: Every day | ORAL | 0 refills | Status: DC

## 2023-04-17 NOTE — Progress Notes (Signed)
Laura Perez [id number] [birth year] 32 y.o.  Subjective:   Patient ID:  Laura Perez is a 32 y.o. (DOB 1991-04-27) female.  Chief Complaint: No chief complaint on file.   HPI Cleone Thurgood presents to the office today for follow-up of ADHD, MDD, BED, panic disorder, emotional lability, and physiological insomnia.  Describes mood today as "not good". Pleasant. Reports tearfulness daily. Mood symptoms - reports  anxiety, depression and irritability. Reports recent panic attacks. Reports increased worry, rumination and over thinking. Reports some obsessive thoughts. Mood has been declining over the past month. Stating "I haven't been doing good over the past month". Reports current medications are helpful, but feels like she needs to make a change. She does have support of family and friends. Decreased interest and motivation. Taking medications as prescribed.  Energy levels pretty lower. Active, does not have a regular exercise routine. Walking 6 to 7000 steps a day. Unable to enjoy usual interests and activities. Dating. Has a boyfriend. Lives with parents - 2 dogs and 2 cats. Spending time with family and her best friends.  Appetite adequate. Weight loss - 235 from 248 pounds. Reports sleeping difficulties. Averages 4 to 5 hours - not using CPAP machine consistently. Focus and concentration difficulties. Completing tasks. Managing aspects of household. Working at  [employer] 20 to 25 hours. Denies SI or HI.  Denies AH or VH. Denies self harm. Denies substance use.  Previous medication trials: Zoloft, lamictal, Celexa, Elavil  Has not tried - Prozac, Effexor, lexapro, Abilify, Rexulti, Seroquel, Latuda, Buspar, Olanzapine   PHQ2-9    Flowsheet Row Office Visit from 05/13/2016 in [facility] Total Score 4  PHQ-9 Total Score 16      Flowsheet Row ED from 03/23/2023 in [facility] Emergency Department at [facility] ED from  05/23/2022 in [facility] Emergency Department at [facility] ED from 04/30/2022 in [facility] Emergency Department at [facility]  C-SSRS RISK CATEGORY No Risk No Risk No Risk        Review of Systems:  Review of Systems  Musculoskeletal:  Negative for gait problem.  Neurological:  Negative for tremors.  Psychiatric/Behavioral:         Please refer to HPI    Medications: I have reviewed the patient's current medications.  Current Outpatient Medications  Medication Sig Dispense Refill   beclomethasone (QVAR) 80 MCG/ACT inhaler Inhale 1 puff into the lungs daily as needed (wheezing).     clomiPRAMINE (ANAFRANIL) 50 MG capsule 1 capsule at bedtime 30 capsule 2   clonazePAM (KLONOPIN) 0.5 MG tablet TAKE 1 TABLET BY MOUTH 2 TIMES A DAY 60 tablet 2   dextroamphetamine (DEXTROSTAT) 10 MG tablet Take 1 tablet (10 mg total) by mouth daily. 30 tablet 0   diclofenac (VOLTAREN) 75 MG EC tablet Take 1 tablet (75 mg total) by mouth 2 (two) times daily. (Patient not taking: No sig reported) 60 tablet 1   dicyclomine (BENTYL) 20 MG tablet Take 1 tablet (20 mg total) by mouth 2 (two) times daily. 20 tablet 0   FLUoxetine (PROZAC) 40 MG capsule Take 1 capsule (40 mg total) by mouth at bedtime. 30 capsule 5   hydrocortisone (ANUSOL-HC) 25 MG suppository Place 1 suppository (25 mg total) rectally 2 (two) times daily. 12 suppository 0   Ibuprofen-diphenhydrAMINE Cit (ADVIL PM PO) Take 2 tablets by mouth daily as needed (pain).     lisdexamfetamine (VYVANSE) 50 MG capsule Take 1 capsule (50  mg total) by mouth daily. 30 capsule 0   lisdexamfetamine (VYVANSE) 70 MG capsule Take 1 capsule (70 mg total) by mouth daily. 30 capsule 0   lisdexamfetamine (VYVANSE) 70 MG capsule Take 1 capsule (70 mg total) by mouth daily. 30 capsule 0   loratadine (CLARITIN) 10 MG tablet Take 1 tablet (10 mg total) by mouth daily. 15 tablet 0   metFORMIN (GLUCOPHAGE-XR) 500 MG 24 hr tablet Take 1,000 mg by mouth 2  (two) times daily.     metoCLOPramide (REGLAN) 10 MG tablet Take 1 tablet (10 mg total) by mouth every 6 (six) hours. 8 tablet 0   NIKKI 3-0.02 MG tablet Take 1 tablet by mouth daily.     NURTEC 75 MG TBDP Take 1 tablet by mouth daily as needed.     ondansetron (ZOFRAN ODT) 4 MG disintegrating tablet Take one tab by mouth Q6hr prn nausea (Dissolve under tongue) (Patient taking differently: Take 4 mg by mouth every 8 (eight) hours as needed for nausea or vomiting.) 12 tablet 0   OZEMPIC, 0.25 OR 0.5 MG/DOSE, 2 MG/1.5ML SOPN Inject 0.5 mg into the skin once a week. Friday     pantoprazole (PROTONIX) 20 MG tablet Take 1 tablet (20 mg total) by mouth daily. 20 tablet 0   propranolol (INDERAL) 10 MG tablet Take 1 tablet (10 mg total) by mouth 3 (three) times daily as needed. 90 tablet 5   topiramate (TOPAMAX) 25 MG tablet Take 25 mg by mouth 2 (two) times daily.     VIBERZI 100 MG TABS Take 1 tablet by mouth 2 (two) times daily.     No current facility-administered medications for this visit.    Medication Side Effects: None  Allergies:  Allergies  Allergen Reactions   Imitrex [Sumatriptan] Shortness Of Breath    Past Medical History:  Diagnosis Date   ADHD    Anxiety    Asthma    Depression    GERD (gastroesophageal reflux disease)    IBS (irritable bowel syndrome)    PCOS (polycystic ovarian syndrome)     Past Medical History, Surgical history, Social history, and Family history were reviewed and updated as appropriate.   Please see review of systems for further details on the patient's review from today.   Objective:   Physical Exam:  LMP 03/15/2023 (Approximate)   Physical Exam Constitutional:      General: She is not in acute distress. Musculoskeletal:        General: No deformity.  Neurological:     Mental Status: She is alert and oriented to person, place, and time.     Coordination: Coordination normal.  Psychiatric:        Attention and Perception: Attention and  perception normal. She does not perceive auditory or visual hallucinations.        Mood and Affect: Affect is not labile, blunt, angry or inappropriate.        Speech: Speech normal.        Behavior: Behavior normal.        Thought Content: Thought content normal. Thought content is not paranoid or delusional. Thought content does not include homicidal or suicidal ideation. Thought content does not include homicidal or suicidal plan.        Cognition and Memory: Cognition and memory normal.        Judgment: Judgment normal.     Comments: Insight intact     Lab Review:     Component Value Date/Time  NA 136 03/23/2023 1134   NA 139 03/05/2017 1055   K 4.2 03/23/2023 1134   CL 107 03/23/2023 1134   CO2 20 (L) 03/23/2023 1134   GLUCOSE 89 03/23/2023 1134   BUN 13 03/23/2023 1134   BUN 14 03/05/2017 1055   CREATININE 0.88 03/23/2023 1134   CALCIUM 9.5 03/23/2023 1134   PROT 7.1 03/23/2023 1134   PROT 6.9 03/05/2017 1055   ALBUMIN 4.1 03/23/2023 1134   ALBUMIN 4.2 03/05/2017 1055   AST 13 (L) 03/23/2023 1134   ALT 10 03/23/2023 1134   ALKPHOS 74 03/23/2023 1134   BILITOT 0.3 03/23/2023 1134   BILITOT 0.3 03/05/2017 1055   GFRNONAA >60 03/23/2023 1134   GFRAA 123 03/05/2017 1055       Component Value Date/Time   WBC 8.6 03/23/2023 1001   RBC 4.80 03/23/2023 1001   HGB 13.8 03/23/2023 1001   HGB 12.5 05/15/2016 1156   HCT 41.9 03/23/2023 1001   HCT 38.4 05/15/2016 1156   PLT 272 03/23/2023 1001   MCV 87.3 03/23/2023 1001   MCV 85 05/15/2016 1156   MCH 28.8 03/23/2023 1001   MCHC 32.9 03/23/2023 1001   RDW 14.2 03/23/2023 1001   RDW 14.1 05/15/2016 1156   LYMPHSABS 3.0 03/23/2023 1001   LYMPHSABS 2.9 05/15/2016 1156   MONOABS 0.5 03/23/2023 1001   EOSABS 0.1 03/23/2023 1001   EOSABS 0.1 05/15/2016 1156   BASOSABS 0.1 03/23/2023 1001   BASOSABS 0.1 05/15/2016 1156    No results found for: "POCLITH", "LITHIUM"   No results found for: "PHENYTOIN", "PHENOBARB",  "VALPROATE", "CBMZ"   .res Assessment: Plan:   Plan:  1. Clopimramine 50mg  at hs 2. Prozac 40mg  daily 3. Propranolol 10mg  TID 4. Clonazepam 0.5mg  - 2 at hs 5. Dextroamphetamine 10mg  daily - added between visit. 6. D/C Vyvanse 70mg  every morning  Add Olanzapine 2.5 mg at hs for sleep and mood symptoms.  Out of work 04/17/2023 through 04/18/2023.  RTC 4 weeks  Discussed potential benefits, risks, and side effects of stimulants with patient to include increased heart rate, palpitations, insomnia, increased anxiety, increased irritability, or decreased appetite.  Instructed patient to contact office if experiencing any significant tolerability issues.  Discussed potential benefits, risk, and side effects of benzodiazepines to include potential risk of tolerance and dependence, as well as possible drowsiness.  Advised patient not to drive if experiencing drowsiness and to take lowest possible effective dose to minimize risk of dependence and tolerance.  Patient advised to contact office with any questions, adverse effects, or acute worsening in signs and symptoms.  There are no diagnoses linked to this encounter.   Please see After Visit Summary for patient specific instructions.  No future appointments.   No orders of the defined types were placed in this encounter.   -------------------------------

## 2023-04-20 ENCOUNTER — Telehealth: Payer: Self-pay | Admitting: Adult Health

## 2023-04-20 NOTE — Telephone Encounter (Signed)
Laura Perez called and wanted me to inform you that her Olanzapine 2.5 mg is working well. Her mood is positive now and she is sleeping well. She wanted me to let you know as she is real happy with the results of the medication.

## 2023-05-01 ENCOUNTER — Ambulatory Visit: Payer: Medicaid Other | Admitting: Adult Health

## 2023-05-01 ENCOUNTER — Telehealth: Payer: Self-pay

## 2023-05-01 ENCOUNTER — Encounter: Payer: Self-pay | Admitting: Adult Health

## 2023-05-01 DIAGNOSIS — F321 Major depressive disorder, single episode, moderate: Secondary | ICD-10-CM

## 2023-05-01 DIAGNOSIS — F410 Panic disorder [episodic paroxysmal anxiety] without agoraphobia: Secondary | ICD-10-CM

## 2023-05-01 DIAGNOSIS — F411 Generalized anxiety disorder: Secondary | ICD-10-CM

## 2023-05-01 DIAGNOSIS — F902 Attention-deficit hyperactivity disorder, combined type: Secondary | ICD-10-CM | POA: Diagnosis not present

## 2023-05-01 MED ORDER — OLANZAPINE 5 MG PO TABS: 5.0000 mg | ORAL_TABLET | Freq: Every day | ORAL | 2 refills | Status: DC

## 2023-05-01 MED ORDER — DEXTROAMPHETAMINE SULFATE 10 MG PO TABS
10.0000 mg | ORAL_TABLET | Freq: Every day | ORAL | 0 refills | Status: DC
Start: 2023-05-01 — End: 2023-10-20

## 2023-05-01 NOTE — Telephone Encounter (Signed)
Prior Authorization submitted for Olanzapine 2.5 mg with [facility], approval received effective PA Case: [id number], 05/01/2023 through 04/30/2024.

## 2023-05-01 NOTE — Progress Notes (Signed)
Laura Perez [id number] 09-16-1990 32 y.o.  Subjective:   Patient ID:  Laura Perez is a 32 y.o. (DOB 02-05-1991) female.  Chief Complaint: No chief complaint on file.   HPI Laura Perez presents to the office today for follow-up of ADHD, MDD, BED, panic disorder, emotional lability, and physiological insomnia.  Describes mood today as "better". Pleasant. Reports tearfulness daily. Mood symptoms - reports decreased anxiety, depression and irritability. Reports one recent panic attack. Reports decreased worry, rumination and over thinking. Reports some obsessive thoughts. Mood has improved. Stating "I feel like I'm doing better". Reports current medications are helpful, but would like to increase the dose of Zyprexa. She reports supportive of family and friends. Decreased interest and motivation. Taking medications as prescribed.  Energy levels improved. Active, does not have a regular exercise routine. Walking 6 to 7000 steps a day. Unable to enjoy usual interests and activities. Dating. Has a boyfriend. Lives with parents - 2 dogs and 2 cats. Spending time with family and her best friends.  Appetite adequate. Weight stable - 235 pounds. Reports sleeping better at night. Averages 7 to 8  hours - not using CPAP machine consistently. Focus and concentration difficulties. Completing tasks. Managing aspects of household. Working at  [employer] 20 hours. Denies SI or HI.  Denies AH or VH. Denies self harm. Denies substance use.  Previous medication trials: Zoloft, lamictal, Celexa, Elavil  Has not tried - Prozac, Effexor, lexapro, Abilify, Rexulti, Seroquel, Latuda, Buspar, Olanzapine   PHQ2-9    Flowsheet Row Office Visit from 05/13/2016 in [facility] at [facility] Total Score 4  PHQ-9 Total Score 16      Flowsheet Row ED from 03/23/2023 in [facility] ED from 05/23/2022 in [facility] Emergency  Department at [facility] ED from 04/30/2022 in [facility] Emergency Department at [facility]  C-SSRS RISK CATEGORY No Risk No Risk No Risk        Review of Systems:  Review of Systems  Musculoskeletal:  Negative for gait problem.  Neurological:  Negative for tremors.  Psychiatric/Behavioral:         Please refer to HPI    Medications: I have reviewed the patient's current medications.  Current Outpatient Medications  Medication Sig Dispense Refill   beclomethasone (QVAR) 80 MCG/ACT inhaler Inhale 1 puff into the lungs daily as needed (wheezing).     clomiPRAMINE (ANAFRANIL) 50 MG capsule 1 capsule at bedtime 30 capsule 2   clonazePAM (KLONOPIN) 0.5 MG tablet Take 1 tablet (0.5 mg total) by mouth 2 (two) times daily. 60 tablet 2   dextroamphetamine (DEXTROSTAT) 10 MG tablet Take 1 tablet (10 mg total) by mouth daily. 30 tablet 0   diclofenac (VOLTAREN) 75 MG EC tablet Take 1 tablet (75 mg total) by mouth 2 (two) times daily. (Patient not taking: No sig reported) 60 tablet 1   dicyclomine (BENTYL) 20 MG tablet Take 1 tablet (20 mg total) by mouth 2 (two) times daily. 20 tablet 0   FLUoxetine (PROZAC) 40 MG capsule Take 1 capsule (40 mg total) by mouth at bedtime. 30 capsule 5   hydrocortisone (ANUSOL-HC) 25 MG suppository Place 1 suppository (25 mg total) rectally 2 (two) times daily. 12 suppository 0   Ibuprofen-diphenhydrAMINE Cit (ADVIL PM PO) Take 2 tablets by mouth daily as needed (pain).     loratadine (CLARITIN) 10 MG tablet Take 1 tablet (10 mg total) by mouth daily. 15 tablet 0  metFORMIN (GLUCOPHAGE-XR) 500 MG 24 hr tablet Take 1,000 mg by mouth 2 (two) times daily.     metoCLOPramide (REGLAN) 10 MG tablet Take 1 tablet (10 mg total) by mouth every 6 (six) hours. 8 tablet 0   NIKKI 3-0.02 MG tablet Take 1 tablet by mouth daily.     NURTEC 75 MG TBDP Take 1 tablet by mouth daily as needed.     OLANZapine (ZYPREXA) 2.5 MG tablet Take 1 tablet (2.5 mg total) by  mouth at bedtime. 30 tablet 2   ondansetron (ZOFRAN ODT) 4 MG disintegrating tablet Take one tab by mouth Q6hr prn nausea (Dissolve under tongue) (Patient taking differently: Take 4 mg by mouth every 8 (eight) hours as needed for nausea or vomiting.) 12 tablet 0   OZEMPIC, 0.25 OR 0.5 MG/DOSE, 2 MG/1.5ML SOPN Inject 0.5 mg into the skin once a week. Friday     pantoprazole (PROTONIX) 20 MG tablet Take 1 tablet (20 mg total) by mouth daily. 20 tablet 0   propranolol (INDERAL) 10 MG tablet Take 1 tablet (10 mg total) by mouth 3 (three) times daily as needed. 90 tablet 5   topiramate (TOPAMAX) 25 MG tablet Take 25 mg by mouth 2 (two) times daily.     VIBERZI 100 MG TABS Take 1 tablet by mouth 2 (two) times daily.     No current facility-administered medications for this visit.    Medication Side Effects: None  Allergies:  Allergies  Allergen Reactions   Imitrex [Sumatriptan] Shortness Of Breath    Past Medical History:  Diagnosis Date   ADHD    Anxiety    Asthma    Depression    GERD (gastroesophageal reflux disease)    IBS (irritable bowel syndrome)    PCOS (polycystic ovarian syndrome)     Past Medical History, Surgical history, Social history, and Family history were reviewed and updated as appropriate.   Please see review of systems for further details on the patient's review from today.   Objective:   Physical Exam:  There were no vitals taken for this visit.  Physical Exam Constitutional:      General: She is not in acute distress. Musculoskeletal:        General: No deformity.  Neurological:     Mental Status: She is alert and oriented to person, place, and time.     Coordination: Coordination normal.  Psychiatric:        Attention and Perception: Attention and perception normal. She does not perceive auditory or visual hallucinations.        Mood and Affect: Mood normal. Mood is not anxious or depressed. Affect is not labile, blunt, angry or inappropriate.         Speech: Speech normal.        Behavior: Behavior normal.        Thought Content: Thought content normal. Thought content is not paranoid or delusional. Thought content does not include homicidal or suicidal ideation. Thought content does not include homicidal or suicidal plan.        Cognition and Memory: Cognition and memory normal.        Judgment: Judgment normal.     Comments: Insight intact     Lab Review:     Component Value Date/Time   NA 136 03/23/2023 1134   NA 139 03/05/2017 1055   K 4.2 03/23/2023 1134   CL 107 03/23/2023 1134   CO2 20 (L) 03/23/2023 1134   GLUCOSE 89 03/23/2023 1134  BUN 13 03/23/2023 1134   BUN 14 03/05/2017 1055   CREATININE 0.88 03/23/2023 1134   CALCIUM 9.5 03/23/2023 1134   PROT 7.1 03/23/2023 1134   PROT 6.9 03/05/2017 1055   ALBUMIN 4.1 03/23/2023 1134   ALBUMIN 4.2 03/05/2017 1055   AST 13 (L) 03/23/2023 1134   ALT 10 03/23/2023 1134   ALKPHOS 74 03/23/2023 1134   BILITOT 0.3 03/23/2023 1134   BILITOT 0.3 03/05/2017 1055   GFRNONAA >60 03/23/2023 1134   GFRAA 123 03/05/2017 1055       Component Value Date/Time   WBC 8.6 03/23/2023 1001   RBC 4.80 03/23/2023 1001   HGB 13.8 03/23/2023 1001   HGB 12.5 05/15/2016 1156   HCT 41.9 03/23/2023 1001   HCT 38.4 05/15/2016 1156   PLT 272 03/23/2023 1001   MCV 87.3 03/23/2023 1001   MCV 85 05/15/2016 1156   MCH 28.8 03/23/2023 1001   MCHC 32.9 03/23/2023 1001   RDW 14.2 03/23/2023 1001   RDW 14.1 05/15/2016 1156   LYMPHSABS 3.0 03/23/2023 1001   LYMPHSABS 2.9 05/15/2016 1156   MONOABS 0.5 03/23/2023 1001   EOSABS 0.1 03/23/2023 1001   EOSABS 0.1 05/15/2016 1156   BASOSABS 0.1 03/23/2023 1001   BASOSABS 0.1 05/15/2016 1156    No results found for: "POCLITH", "LITHIUM"   No results found for: "PHENYTOIN", "PHENOBARB", "VALPROATE", "CBMZ"   .res Assessment: Plan:    Plan:  1. Clopimramine 50mg  at hs 2. Prozac 40mg  daily 3. Propranolol 10mg  TID 4. Clonazepam 0.5mg  - 2 at  hs 5. Dextroamphetamine 10mg  daily - added between visit. 6. Increase Olanzapine 2.5 to 5mg  at hs for sleep and mood symptoms.  RTC 4 weeks  Discussed potential benefits, risks, and side effects of stimulants with patient to include increased heart rate, palpitations, insomnia, increased anxiety, increased irritability, or decreased appetite.  Instructed patient to contact office if experiencing any significant tolerability issues.  Discussed potential benefits, risk, and side effects of benzodiazepines to include potential risk of tolerance and dependence, as well as possible drowsiness.  Advised patient not to drive if experiencing drowsiness and to take lowest possible effective dose to minimize risk of dependence and tolerance.  Patient advised to contact office with any questions, adverse effects, or acute worsening in signs and symptoms. There are no diagnoses linked to this encounter.   Please see After Visit Summary for patient specific instructions.  Future Appointments  Date Time Provider Department Center  05/01/2023  3:00 PM Hafsa Lohn, Thereasa Solo, NP [facility] None    No orders of the defined types were placed in this encounter.   -------------------------------

## 2023-05-08 ENCOUNTER — Telehealth: Payer: Self-pay

## 2023-05-08 NOTE — Telephone Encounter (Signed)
Patient called and said she was in crisis. Wanted an urgent appt today. She is scheduled for 1/17. She was last seen 12/6 and was doing well, but olanzapine was increased to 5 mg. She said she is sleeping better and she thought it might help with her depression but she is reporting is has fared up and quickly. She reports passive SI, no plan. She reports crying, is moody, and doesn't want to go to work. I asked if there were any new stressors and she said work has been stressful, but that is not new. A friend is going to have heart surgery. I reviewed emergency resources with her, but told her I would send you a message and see if you could recommend something until appt.

## 2023-05-08 NOTE — Telephone Encounter (Signed)
Laura Perez called patient.

## 2023-06-11 ENCOUNTER — Encounter: Payer: Self-pay | Admitting: Professional Counselor

## 2023-06-11 ENCOUNTER — Ambulatory Visit: Payer: Medicaid Other | Admitting: Professional Counselor

## 2023-06-11 DIAGNOSIS — F331 Major depressive disorder, recurrent, moderate: Secondary | ICD-10-CM

## 2023-06-11 DIAGNOSIS — F411 Generalized anxiety disorder: Secondary | ICD-10-CM | POA: Diagnosis not present
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
## 2023-06-12 ENCOUNTER — Ambulatory Visit: Payer: Medicaid Other | Admitting: Adult Health

## 2023-06-12 ENCOUNTER — Encounter: Payer: Self-pay | Admitting: Adult Health

## 2023-06-12 DIAGNOSIS — F902 Attention-deficit hyperactivity disorder, combined type: Secondary | ICD-10-CM | POA: Diagnosis not present

## 2023-06-12 DIAGNOSIS — F411 Generalized anxiety disorder: Secondary | ICD-10-CM

## 2023-06-12 DIAGNOSIS — F410 Panic disorder [episodic paroxysmal anxiety] without agoraphobia: Secondary | ICD-10-CM

## 2023-06-12 DIAGNOSIS — F331 Major depressive disorder, recurrent, moderate: Secondary | ICD-10-CM | POA: Diagnosis not present

## 2023-06-12 NOTE — Progress Notes (Signed)
Laura Perez [id number] [birth year] 33 y.o.  Subjective:   Patient ID:  Laura Perez is a 33 y.o. (DOB 1991-01-21) female.  Chief Complaint: No chief complaint on file.     HPI Laura Perez presents to the office today for follow-up of 25 ADHD, MDD, BED, panic disorder, emotional lability, and physiological insomnia.  Describes mood today as "not too good". Pleasant. Flat. Reports increased tearfulness. Mood symptoms - reports anxiety, depression and irritability. Reports decreased interest and motivation. Denies panic attacks. Reports increased worry, rumination and over thinking. Reports some obsessive thoughts. Reports reckless behaviors - spending money she doesn't need to be spending. Doesn't want to go to work or do anything. Wanting to buy stuff to make her feel better. Wanted to buy a computer - but family talked her out of it. Best friend since 6th grade having a 3rd heart surgery - valve replacement. Will be going to [place] for 3 months to be with family while she recovers. Mood is low. Stating "I feel like I would like to lay on the floor like garbage". Reports current medications are helpful, but is willing to consider other options. She reports supportive of family and friends. Taking medications as prescribed.  Energy levels lower. Active, does not have a regular exercise routine. Walking at work. Unable to enjoy usual interests and activities. Dating. Has a boyfriend. Lives with parents - 2 dogs and 2 cats. Spending time with family and her best friends.  Appetite adequate. Weight stable - 235 pounds. Reports difficulties with sleep over the past 10 days. Averages 4 hours - not using CPAP machine. Reports difficulties with focus and concentration. Completing tasks. Managing aspects of household. Working at [employer] part time.   Denies SI or HI.  Denies AH or VH. Reports seeing things out of the corner of her eye.  Denies self harm. Denies substance use.    Previous  medication trials: Zoloft, lamictal, Celexa, Elavil  GAD-7    Flowsheet Row Counselor from 06/11/2023 in [facility]  Total GAD-7 Score 19      PHQ2-9    Flowsheet Row Counselor from 06/11/2023 in [facility] Office Visit from 05/13/2016 in [facility] Total Score 6 4  PHQ-9 Total Score 24 16      Flowsheet Row ED from 03/23/2023 in [facility] Emergency Department at [facility] ED from 05/23/2022 in [facility] Emergency Department at [facility] ED from 04/30/2022 in [facility] Emergency Department at [facility]  C-SSRS RISK CATEGORY No Risk No Risk No Risk        Review of Systems:  Review of Systems  Musculoskeletal:  Negative for gait problem.  Neurological:  Negative for tremors.  Psychiatric/Behavioral:         Please refer to HPI    Medications: I have reviewed the patient's current medications.  Current Outpatient Medications  Medication Sig Dispense Refill   beclomethasone (QVAR) 80 MCG/ACT inhaler Inhale 1 puff into the lungs daily as needed (wheezing).     clomiPRAMINE (ANAFRANIL) 50 MG capsule 1 capsule at bedtime 30 capsule 2   clonazePAM (KLONOPIN) 0.5 MG tablet Take 1 tablet (0.5 mg total) by mouth 2 (two) times daily. 60 tablet 2   dextroamphetamine (DEXTROSTAT) 10 MG tablet Take 1 tablet (10 mg total) by mouth daily. 30 tablet 0   diclofenac (VOLTAREN) 75 MG EC tablet Take 1 tablet (75 mg total) by mouth  2 (two) times daily. (Patient not taking: No sig reported) 60 tablet 1   dicyclomine (BENTYL) 20 MG tablet Take 1 tablet (20 mg total) by mouth 2 (two) times daily. 20 tablet 0   FLUoxetine (PROZAC) 40 MG capsule Take 1 capsule (40 mg total) by mouth at bedtime. 30 capsule 5   hydrocortisone (ANUSOL-HC) 25 MG suppository Place 1 suppository (25 mg total) rectally 2 (two) times daily. 12 suppository 0   Ibuprofen-diphenhydrAMINE  Cit (ADVIL PM PO) Take 2 tablets by mouth daily as needed (pain).     loratadine (CLARITIN) 10 MG tablet Take 1 tablet (10 mg total) by mouth daily. 15 tablet 0   metFORMIN (GLUCOPHAGE-XR) 500 MG 24 hr tablet Take 1,000 mg by mouth 2 (two) times daily.     metoCLOPramide (REGLAN) 10 MG tablet Take 1 tablet (10 mg total) by mouth every 6 (six) hours. 8 tablet 0   NIKKI 3-0.02 MG tablet Take 1 tablet by mouth daily.     NURTEC 75 MG TBDP Take 1 tablet by mouth daily as needed.     OLANZapine (ZYPREXA) 5 MG tablet Take 1 tablet (5 mg total) by mouth at bedtime. 30 tablet 2   ondansetron (ZOFRAN ODT) 4 MG disintegrating tablet Take one tab by mouth Q6hr prn nausea (Dissolve under tongue) (Patient taking differently: Take 4 mg by mouth every 8 (eight) hours as needed for nausea or vomiting.) 12 tablet 0   OZEMPIC, 0.25 OR 0.5 MG/DOSE, 2 MG/1.5ML SOPN Inject 0.5 mg into the skin once a week. Friday     pantoprazole (PROTONIX) 20 MG tablet Take 1 tablet (20 mg total) by mouth daily. 20 tablet 0   propranolol (INDERAL) 10 MG tablet Take 1 tablet (10 mg total) by mouth 3 (three) times daily as needed. 90 tablet 5   topiramate (TOPAMAX) 25 MG tablet Take 25 mg by mouth 2 (two) times daily.     VIBERZI 100 MG TABS Take 1 tablet by mouth 2 (two) times daily.     No current facility-administered medications for this visit.    Medication Side Effects: None  Allergies:  Allergies  Allergen Reactions   Imitrex [Sumatriptan] Shortness Of Breath    Past Medical History:  Diagnosis Date   ADHD    Anxiety    Asthma    Depression    GERD (gastroesophageal reflux disease)    IBS (irritable bowel syndrome)    PCOS (polycystic ovarian syndrome)     Past Medical History, Surgical history, Social history, and Family history were reviewed and updated as appropriate.   Please see review of systems for further details on the patient's review from today.   Objective:   Physical Exam:  There were no  vitals taken for this visit.  Physical Exam Constitutional:      General: She is not in acute distress. Musculoskeletal:        General: No deformity.  Neurological:     Mental Status: She is alert and oriented to person, place, and time.     Coordination: Coordination normal.  Psychiatric:        Attention and Perception: Attention and perception normal. She does not perceive auditory or visual hallucinations.        Mood and Affect: Mood is anxious and depressed. Affect is not labile, blunt, angry or inappropriate.        Speech: Speech normal.        Behavior: Behavior normal.  Thought Content: Thought content normal. Thought content is not paranoid or delusional. Thought content does not include homicidal or suicidal ideation. Thought content does not include homicidal or suicidal plan.        Cognition and Memory: Cognition and memory normal.        Judgment: Judgment normal.     Comments: Insight intact     Lab Review:     Component Value Date/Time   NA 136 03/23/2023 1134   NA 139 03/05/2017 1055   K 4.2 03/23/2023 1134   CL 107 03/23/2023 1134   CO2 20 (L) 03/23/2023 1134   GLUCOSE 89 03/23/2023 1134   BUN 13 03/23/2023 1134   BUN 14 03/05/2017 1055   CREATININE 0.88 03/23/2023 1134   CALCIUM 9.5 03/23/2023 1134   PROT 7.1 03/23/2023 1134   PROT 6.9 03/05/2017 1055   ALBUMIN 4.1 03/23/2023 1134   ALBUMIN 4.2 03/05/2017 1055   AST 13 (L) 03/23/2023 1134   ALT 10 03/23/2023 1134   ALKPHOS 74 03/23/2023 1134   BILITOT 0.3 03/23/2023 1134   BILITOT 0.3 03/05/2017 1055   GFRNONAA >60 03/23/2023 1134   GFRAA 123 03/05/2017 1055       Component Value Date/Time   WBC 8.6 03/23/2023 1001   RBC 4.80 03/23/2023 1001   HGB 13.8 03/23/2023 1001   HGB 12.5 05/15/2016 1156   HCT 41.9 03/23/2023 1001   HCT 38.4 05/15/2016 1156   PLT 272 03/23/2023 1001   MCV 87.3 03/23/2023 1001   MCV 85 05/15/2016 1156   MCH 28.8 03/23/2023 1001   MCHC 32.9 03/23/2023 1001    RDW 14.2 03/23/2023 1001   RDW 14.1 05/15/2016 1156   LYMPHSABS 3.0 03/23/2023 1001   LYMPHSABS 2.9 05/15/2016 1156   MONOABS 0.5 03/23/2023 1001   EOSABS 0.1 03/23/2023 1001   EOSABS 0.1 05/15/2016 1156   BASOSABS 0.1 03/23/2023 1001   BASOSABS 0.1 05/15/2016 1156    No results found for: "POCLITH", "LITHIUM"   No results found for: "PHENYTOIN", "PHENOBARB", "VALPROATE", "CBMZ"   .res Assessment: Plan:    Plan:  Clomipramine 50mg  at hs Prozac 40mg  daily Propranolol 10mg  TID Clonazepam 0.5mg  - 2 at hs Dextroamphetamine 10mg  daily when working  Increase Olanzapine 5mg  to 10mg  at hs for sleep and mood symptoms.  Discussed taking patient out of work for 30 days for mood stabilization. Patient and famil to discuss option and call next week.  RTC 4 weeks  Discussed potential benefits, risks, and side effects of stimulants with patient to include increased heart rate, palpitations, insomnia, increased anxiety, increased irritability, or decreased appetite.  Instructed patient to contact office if experiencing any significant tolerability issues.  Discussed potential benefits, risk, and side effects of benzodiazepines to include potential risk of tolerance and dependence, as well as possible drowsiness.  Advised patient not to drive if experiencing drowsiness and to take lowest possible effective dose to minimize risk of dependence and tolerance.  Patient advised to contact office with any questions, adverse effects, or acute worsening in signs and symptoms.  There are no diagnoses linked to this encounter.   Please see After Visit Summary for patient specific instructions.  Future Appointments  Date Time Provider Department Center  08/04/2023  4:00 PM Gaspar Bidding [facility] CP-CP None  08/14/2023  4:00 PM Gaspar Bidding [facility] CP-CP None  08/17/2023  4:00 PM Gaspar Bidding [facility] CP-CP None  08/24/2023  4:00 PM Gaspar Bidding, [facility] CP-CP None  08/31/2023  4:00 PM Gaspar Bidding, [facility] CP-CP None    No orders of the defined types were placed in this encounter.   -------------------------------

## 2023-06-17 ENCOUNTER — Telehealth: Payer: Self-pay | Admitting: Adult Health

## 2023-06-17 NOTE — Telephone Encounter (Signed)
Pt called and said that she was told to call and let gina know how she is feeling. She said she is sleeping well but she is still depressed and not happy. Please call her at [phone]

## 2023-06-17 NOTE — Telephone Encounter (Signed)
She reports that nothing new has happened. Her friend got through her surgery fine, she came home and cried. She is eating but not as much. She reports 7/8 hours, she wakes up sometimes for a few minutes but is easy for her to go back to sleep. She has some difficulty getting out of bed. She went back to work today for the first time since increasing Olanzapine to 10mg . She feels like the medication is doing well, she denies any SE. But is still feeling very depressed and not happy.

## 2023-06-18 ENCOUNTER — Telehealth: Payer: Self-pay | Admitting: Adult Health

## 2023-06-18 NOTE — Telephone Encounter (Signed)
Patient notified of recommendation to give olanzapine more time.

## 2023-06-18 NOTE — Telephone Encounter (Signed)
PT called again today. She is not doing well, stayed out of work today. She is a bucket of nerves, crying, couldn't sleep last night. Needs to do something. Asked to talk to [facility].

## 2023-06-22 ENCOUNTER — Other Ambulatory Visit: Payer: Self-pay

## 2023-06-22 MED ORDER — OLANZAPINE 10 MG PO TABS: 10.0000 mg | ORAL_TABLET | Freq: Every day | ORAL | 1 refills | Status: DC

## 2023-07-10 ENCOUNTER — Telehealth: Payer: Medicaid Other | Admitting: Adult Health

## 2023-08-04 ENCOUNTER — Ambulatory Visit: Payer: Medicaid Other | Admitting: Professional Counselor

## 2023-08-06 ENCOUNTER — Ambulatory Visit (INDEPENDENT_AMBULATORY_CARE_PROVIDER_SITE_OTHER): Payer: Medicaid Other | Admitting: Professional Counselor

## 2023-08-06 ENCOUNTER — Encounter: Payer: Self-pay | Admitting: Professional Counselor

## 2023-08-06 DIAGNOSIS — F330 Major depressive disorder, recurrent, mild: Secondary | ICD-10-CM | POA: Diagnosis not present
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
## 2023-08-11 ENCOUNTER — Other Ambulatory Visit: Payer: Self-pay | Admitting: Adult Health

## 2023-08-11 DIAGNOSIS — F410 Panic disorder [episodic paroxysmal anxiety] without agoraphobia: Secondary | ICD-10-CM

## 2023-08-11 DIAGNOSIS — F411 Generalized anxiety disorder: Secondary | ICD-10-CM

## 2023-08-14 ENCOUNTER — Ambulatory Visit: Payer: Medicaid Other | Admitting: Professional Counselor

## 2023-08-14 ENCOUNTER — Other Ambulatory Visit: Payer: Self-pay | Admitting: Adult Health

## 2023-08-16 ENCOUNTER — Other Ambulatory Visit: Payer: Self-pay | Admitting: Adult Health

## 2023-08-16 DIAGNOSIS — F321 Major depressive disorder, single episode, moderate: Secondary | ICD-10-CM

## 2023-08-17 ENCOUNTER — Ambulatory Visit: Payer: Medicaid Other | Admitting: Professional Counselor

## 2023-08-24 ENCOUNTER — Ambulatory Visit: Payer: Medicaid Other | Admitting: Professional Counselor

## 2023-08-31 ENCOUNTER — Ambulatory Visit: Payer: Medicaid Other | Admitting: Professional Counselor

## 2023-08-31 ENCOUNTER — Encounter: Payer: Self-pay | Admitting: Professional Counselor

## 2023-08-31 DIAGNOSIS — F422 Mixed obsessional thoughts and acts: Secondary | ICD-10-CM

## 2023-08-31 DIAGNOSIS — F330 Major depressive disorder, recurrent, mild: Secondary | ICD-10-CM | POA: Diagnosis not present

## 2023-08-31 DIAGNOSIS — F411 Generalized anxiety disorder: Secondary | ICD-10-CM | POA: Diagnosis not present

## 2023-08-31 NOTE — Progress Notes (Addendum)
 [facility] Counselor/Therapist Progress Note  Patient ID: Laura Perez, MRN: [id number],    Date: 08/31/2023  Time Spent: 4:11 PM to 5:07 PM  Treatment Type: Individual Therapy  Reported Symptoms: Tearfulness, worries, stress, fears, low mood, sadness; obsessive and compulsive tendencies: saving obsessions and compulsions, miscellaneous obsessions such as need to know/remember, fear of saying certain things, fear of not saying the right thing, fear of losing things, intrusive images, intrusive nonsense sounds words or music, bothered by certain sounds/noises, preoccupation with lucky/unlucky numbers, superstitious fears such as salt over shoulder related to protection, not walking under ladders, keeping bracelets on, horseshoe over door; somatic obsessions related to appearance including hairstyle and jewelry, repeating rituals of rereading or rewriting, miscellaneous compulsions of superstitious behaviors, and trichotillomania (pulling hair on head). Patient endorses superstitious fears and behaviors and trichotillomania as primary to obsessive-compulsive tendencies.  Mental Status Exam:  Appearance:   Neat     Behavior:  Appropriate, Sharing, and Motivated  Motor:  Normal  Speech/Language:   Clear and Coherent and Normal Rate  Affect:  Appropriate and Congruent  Mood:  normal  Thought process:  normal  Thought content:    WNL  Sensory/Perceptual disturbances:    WNL  Orientation:  oriented to person, place, time/date, and situation  Attention:  Good  Concentration:  Good  Memory:  WNL  Fund of knowledge:   Good  Insight:    Good  Judgment:   Good  Impulse Control:  Fair   Risk Assessment: Danger to Self:  No Self-injurious Behavior: No Danger to Others: No Duty to Warn:no Physical Aggression / Violence:No  Access to Firearms a concern: No  Gang Involvement:No   Subjective: Patient presented to session to address concerns of depression and anxiety with related  obsessional mixed thoughts and acts.  She reported mixed progress at this time.  She voiced stress after her parents left for vacation and identified how she coped, her boyfriend being with her as helpful to mitigate symptoms.  She reported a day in particular of more challenging depression when she experienced exacerbated work stress, fearing that her job was at risk.  Patient shared regarding her thoughts about an "inner Clinesmith", and how she often feels as though she were still "a little kid".  She identified coping with this by scheduling times to cry, and listening to her inner child when she tells her she needs a good cry.  Patient also identified crying during times of pre-menstrual cycle.  Patient reported progress with her shopping impulses, and with stress/comfort eating since last session.  Patient shared regarding rituals with her bracelets, leaving the house and her routines, and reported instances of stress throwing up related to these anxieties, and fear of contamination by history during the pandemic when she experienced panic attacks.  Counselor facilitated YBOCS screener and patient scored a 29 indicating a moderate-severe score.  Counselor and patient discussed results; goal addition to be determined upon further discussion. Counselor facilitated goal planning worksheet, and patient identified desire to accomplish crying less and relaxing more in the next week, cleaning her room in the next month, build up to 40 hours of work in the next year, and to have independent living in 5 years ("maybe").  She identified obstacles to reaching her goals as anxiety, and needing to believe in herself to achieve her goals, and what she can do tomorrow: to relax.  Counselor assisted patient in identifying roadblocks and desired task completion.  Patient identified cleaning room  as overwhelming.  Counselor helped patient to identify coping skills such as resourcing parents for support, and dividing cleaning into  categories, and/or taking small steps without feeling that she has to clean it all at once.  They discussed positive self talk and reinforcement strategies.  Interventions: Solution-Oriented/Positive Psychology, Humanistic/Existential, Insight-Oriented, and Assessment  Diagnosis:   ICD-10-CM   1. Generalized anxiety disorder  F41.1     2. Major depressive disorder, recurrent episode, mild (HCC)  F33.0     3. Mixed obsessional thoughts and acts  F42.2       Plan: Patient is scheduled for follow-up; continue process work and developing coping skills.  Continue to discuss OCD symptoms and related treatment planning.  Patient short-term personal goal between sessions to be mindful of goal planning worksheet and goals she identified, and resourcing coping skills as discussed above.  Progress note was dictated with Dragon and reviewed for accuracy.  Anthon Kins, [facility]

## 2023-09-10 ENCOUNTER — Other Ambulatory Visit: Payer: Self-pay | Admitting: Adult Health

## 2023-10-02 ENCOUNTER — Ambulatory Visit: Admitting: Professional Counselor

## 2023-10-02 ENCOUNTER — Encounter: Payer: Self-pay | Admitting: Professional Counselor

## 2023-10-02 DIAGNOSIS — F411 Generalized anxiety disorder: Secondary | ICD-10-CM

## 2023-10-02 DIAGNOSIS — F330 Major depressive disorder, recurrent, mild: Secondary | ICD-10-CM

## 2023-10-02 NOTE — Progress Notes (Signed)
 [facility] Counselor/Therapist Progress Note  Patient ID: Laura Perez, MRN: [id number],    Date: 10/02/2023  Time Spent: 3:13 PM to 4:09 PM  Treatment Type: Individual Therapy  Reported Symptoms: Impulsive spending, nervousness, restlessness, fatigue, anhedonia, low motivation, work stress, burnout, sadness, low mood, grief/loss, procrastination, sense of overwhelm  Mental Status Exam:  Appearance:   Casual     Behavior:  Appropriate and Sharing  Motor:  Normal  Speech/Language:   Clear and Coherent and Normal Rate  Affect:  Appropriate and Congruent  Mood:  normal  Thought process:  normal  Thought content:    WNL  Sensory/Perceptual disturbances:    WNL  Orientation:  oriented to person, place, time/date, and situation  Attention:  Good  Concentration:  Good  Memory:  WNL  Fund of knowledge:   Good  Insight:    Good  Judgment:   Good  Impulse Control:  Good   Risk Assessment: Danger to Self:  No Self-injurious Behavior: No Danger to Others: No Duty to Warn:no Physical Aggression / Violence:No  Access to Firearms a concern: No  Gang Involvement:No   Subjective: Patient presented to session to address concerns of anxiety and depression.  She reported minimal progress at this time.  Patient reported an increase of symptomology including feeling to be up against a wall of stress.  She voiced calling her room my depression room because she feels that its organization and cleanliness went downhill due to depression.  She voiced factors of a coworker having died from cancer/stroke and her boyfriend's grandmother as having died as exacerbating her experience of depression.  Patient identified coloring and videogames as helpful, and impulse buying, however the latter also as stressing her out.  She also voiced having had a birthday recently as a positive experience.  She identified her eating to feel better due to getting a weight management shot recently.  Counselor  affirmed patient resourcing of positive and protective factors, and affirmed patient experience of grief and loss, depressive and anxious symptoms, and overwhelm around her room.  Patient identified her room as a goal she would most like to work on.  Counselor help facilitate insight into patient needs.  Patient identified need for a break from work.  Counselor helped patient create strategies as to what she could do during her break to improve mood and pursue personal goals.  They discussed patient perhaps taking a week off that allows her to rest and relax and have fun but also address the matter of cleaning and organizing her room.  Counselor suggested patient consider cleaning up her room via categories, and/or setting a timer for finite blocks of time to spend on it, if she gets stuck feeling overwhelmed.  Counselor assisted patient with strategies for harm reduction around impulsive buying including setting an alarm on her phone in which she waits to buy and once she passes each segment of time, she resets the alarm; patient also identified talking to her mom when she wants to impulse buy, and having her mom help discern if it is a sensible purchase.  Counselor affirmed and encourage patient proactive stance.  Interventions: Solution-Oriented/Positive Psychology, Humanistic/Existential, Insight-Oriented, and Goal Planning  Diagnosis:   ICD-10-CM   1. Generalized anxiety disorder  F41.1     2. Major depressive disorder, recurrent episode, mild (HCC)  F33.0       Plan: Patient is scheduled for follow-up; continue process work and developing coping skills.  STG between sessions to talk  with mother about having a week break from work to rest, relax, have fun and focus on personal goal of cleaning room.  Practice harm reduction strategies as discussed for impulse buying.  Progress note was dictated with Dragon and reviewed for accuracy.  Almarie ONEIDA Sprang,  [facility]

## 2023-10-03 ENCOUNTER — Other Ambulatory Visit: Payer: Self-pay | Admitting: Adult Health

## 2023-10-03 DIAGNOSIS — F321 Major depressive disorder, single episode, moderate: Secondary | ICD-10-CM

## 2023-10-04 NOTE — Telephone Encounter (Signed)
 Please call to schedule FU, did not respond to MyChart msg to schedule.

## 2023-10-05 ENCOUNTER — Other Ambulatory Visit: Payer: Self-pay | Admitting: Adult Health

## 2023-10-05 DIAGNOSIS — F321 Major depressive disorder, single episode, moderate: Secondary | ICD-10-CM

## 2023-10-05 NOTE — Telephone Encounter (Signed)
 Called to schedule follow up.  LVM to RTC

## 2023-10-06 NOTE — Telephone Encounter (Signed)
 Please call to schedule FU. She is past due and has not responded to [employer].

## 2023-10-06 NOTE — Telephone Encounter (Signed)
 LVM to [facility]

## 2023-10-06 NOTE — Telephone Encounter (Signed)
 Pt called at 3:06p requesting refill of Clomipramine  and Clonazepam  to   [facility] [id number] Jonette Nestle, [place] - [address] [place] [address] Phone: [phone]  Fax: [phone]   Next appt 5/27

## 2023-10-08 ENCOUNTER — Other Ambulatory Visit: Payer: Self-pay | Admitting: Adult Health

## 2023-10-20 ENCOUNTER — Encounter: Payer: Self-pay | Admitting: Adult Health

## 2023-10-20 ENCOUNTER — Telehealth: Admitting: Adult Health

## 2023-10-20 DIAGNOSIS — F50819 Binge eating disorder, unspecified: Secondary | ICD-10-CM

## 2023-10-20 DIAGNOSIS — F5104 Psychophysiologic insomnia: Secondary | ICD-10-CM

## 2023-10-20 DIAGNOSIS — F410 Panic disorder [episodic paroxysmal anxiety] without agoraphobia: Secondary | ICD-10-CM | POA: Diagnosis not present

## 2023-10-20 DIAGNOSIS — F411 Generalized anxiety disorder: Secondary | ICD-10-CM

## 2023-10-20 DIAGNOSIS — R4586 Emotional lability: Secondary | ICD-10-CM

## 2023-10-20 DIAGNOSIS — F339 Major depressive disorder, recurrent, unspecified: Secondary | ICD-10-CM

## 2023-10-20 DIAGNOSIS — F330 Major depressive disorder, recurrent, mild: Secondary | ICD-10-CM

## 2023-10-20 DIAGNOSIS — F902 Attention-deficit hyperactivity disorder, combined type: Secondary | ICD-10-CM

## 2023-10-20 DIAGNOSIS — F321 Major depressive disorder, single episode, moderate: Secondary | ICD-10-CM

## 2023-10-20 DIAGNOSIS — F909 Attention-deficit hyperactivity disorder, unspecified type: Secondary | ICD-10-CM | POA: Diagnosis not present

## 2023-10-20 DIAGNOSIS — Z79899 Other long term (current) drug therapy: Secondary | ICD-10-CM

## 2023-10-20 MED ORDER — PROPRANOLOL HCL 10 MG PO TABS: 10.0000 mg | ORAL_TABLET | Freq: Three times a day (TID) | ORAL | 2 refills | Status: AC | PRN

## 2023-10-20 MED ORDER — CLONAZEPAM 0.5 MG PO TABS: 0.5000 mg | ORAL_TABLET | Freq: Two times a day (BID) | ORAL | 2 refills | Status: DC

## 2023-10-20 MED ORDER — OLANZAPINE 10 MG PO TABS: 10.0000 mg | ORAL_TABLET | Freq: Every day | ORAL | 2 refills | Status: DC

## 2023-10-20 MED ORDER — CLOMIPRAMINE HCL 50 MG PO CAPS: 50.0000 mg | ORAL_CAPSULE | Freq: Every day | ORAL | 2 refills | Status: DC

## 2023-10-20 MED ORDER — DEXTROAMPHETAMINE SULFATE 10 MG PO TABS
10.0000 mg | ORAL_TABLET | Freq: Every day | ORAL | 0 refills | Status: DC
Start: 2023-10-20 — End: 2023-12-23

## 2023-10-20 MED ORDER — FLUOXETINE HCL 40 MG PO CAPS: 40.0000 mg | ORAL_CAPSULE | Freq: Every day | ORAL | 2 refills | Status: DC

## 2023-10-20 NOTE — Progress Notes (Signed)
 Laura Perez [id number] 1990-09-01 33 y.o.  Virtual Visit via Video Note  I connected with pt @ on 10/20/23 at  4:00 PM EDT by a video enabled telemedicine application and verified that I am speaking with the correct person using two identifiers.   I discussed the limitations of evaluation and management by telemedicine and the availability of in person appointments. The patient expressed understanding and agreed to proceed.  I discussed the assessment and treatment plan with the patient. The patient was provided an opportunity to ask questions and all were answered. The patient agreed with the plan and demonstrated an understanding of the instructions.   The patient was advised to call back or seek an in-person evaluation if the symptoms worsen or if the condition fails to improve as anticipated.  I provided 25 minutes of non-face-to-face time during this encounter.  The patient was located at home.  The provider was located at [facility].   Laura Camera, NP   Subjective:   Patient ID:  Laura Perez is a 33 y.o. (DOB 06-23-1990) female.  Chief Complaint: No chief complaint on file.   HPI Laura Perez presents for follow-up of ADHD, MDD, BED, panic disorder, emotional lability, and physiological insomnia.  Describes mood today as "not too good". Pleasant. Denies tearfulness. Mood symptoms - denies anxiety, depression and irritability. Reports improved interest and motivation. Denies recent panic attacks. Reports some worry - "a little bit - normal for me". Denies rumination and over thinking. Denies obsessive thoughts. Denies reckless behaviors - over spending. Reports mood as stable. Stating "I feel like I'm doing really good, improved a lot". Reports medications are helpful. Taking medications as prescribed.  Energy levels improved. Active, does not have a regular exercise routine. Walking at work. Unable to enjoy usual interests and activities. Dating. Has a  boyfriend. Lives with parents - 2 dogs and 2 cats. Spending time with family and her best friends.  Appetite adequate. Weight loss - 5 pounds - 250 pounds. Reports sleep has improved. Averages 11 hours. Reports improved focus and concentration. Completing tasks. Managing aspects of household. Working at [employer] part time.   Denies SI or HI.  Denies AH or VH.  Denies self harm. Denies substance use.  Previous medication trials: Zoloft, lamictal , Celexa, Elavil  Review of Systems:  Review of Systems  Musculoskeletal:  Negative for gait problem.  Neurological:  Negative for tremors.  Psychiatric/Behavioral:         Please refer to HPI    Medications: I have reviewed the patient's current medications.  Current Outpatient Medications  Medication Sig Dispense Refill   beclomethasone (QVAR) 80 MCG/ACT inhaler Inhale 1 puff into the lungs daily as needed (wheezing).     clomiPRAMINE  (ANAFRANIL ) 50 MG capsule TAKE 1 CAPSULE BY MOUTH AT BEDTIME 30 capsule 0   clonazePAM  (KLONOPIN ) 0.5 MG tablet TAKE 1 TABLET BY MOUTH 2 TIMES A DAY 28 tablet 0   dextroamphetamine  (DEXTROSTAT ) 10 MG tablet Take 1 tablet (10 mg total) by mouth daily. 30 tablet 0   diclofenac  (VOLTAREN ) 75 MG EC tablet Take 1 tablet (75 mg total) by mouth 2 (two) times daily. (Patient not taking: No sig reported) 60 tablet 1   dicyclomine  (BENTYL ) 20 MG tablet Take 1 tablet (20 mg total) by mouth 2 (two) times daily. 20 tablet 0   FLUoxetine  (PROZAC ) 40 MG capsule Take 1 capsule (40 mg total) by mouth at bedtime. 30 capsule 5   hydrocortisone  (ANUSOL -HC) 25 MG suppository Place  1 suppository (25 mg total) rectally 2 (two) times daily. 12 suppository 0   Ibuprofen -diphenhydrAMINE Cit (ADVIL  PM PO) Take 2 tablets by mouth daily as needed (pain).     loratadine  (CLARITIN ) 10 MG tablet Take 1 tablet (10 mg total) by mouth daily. 15 tablet 0   metFORMIN (GLUCOPHAGE-XR) 500 MG 24 hr tablet Take 1,000 mg by mouth 2 (two) times daily.      metoCLOPramide  (REGLAN ) 10 MG tablet Take 1 tablet (10 mg total) by mouth every 6 (six) hours. 8 tablet 0   Laura Perez 3-0.02 MG tablet Take 1 tablet by mouth daily.     NURTEC 75 MG TBDP Take 1 tablet by mouth daily as needed.     OLANZapine  (ZYPREXA ) 10 MG tablet TAKE 1 TABLET BY MOUTH AT BEDTIME 30 tablet 0   ondansetron  (ZOFRAN  ODT) 4 MG disintegrating tablet Take one tab by mouth Q6hr prn nausea (Dissolve under tongue) (Patient taking differently: Take 4 mg by mouth every 8 (eight) hours as needed for nausea or vomiting.) 12 tablet 0   OZEMPIC, 0.25 OR 0.5 MG/DOSE, 2 MG/1.5ML SOPN Inject 0.5 mg into the skin once a week. Friday     pantoprazole  (PROTONIX ) 20 MG tablet Take 1 tablet (20 mg total) by mouth daily. 20 tablet 0   propranolol  (INDERAL ) 10 MG tablet TAKE 1 TABLET BY MOUTH THREE TIMES A DAY AS NEEDED 270 tablet 0   topiramate (TOPAMAX) 25 MG tablet Take 25 mg by mouth 2 (two) times daily.     VIBERZI 100 MG TABS Take 1 tablet by mouth 2 (two) times daily.     No current facility-administered medications for this visit.    Medication Side Effects: None  Allergies:  Allergies  Allergen Reactions   Imitrex [Sumatriptan] Shortness Of Breath    Past Medical History:  Diagnosis Date   ADHD    Anxiety    Asthma    Depression    GERD (gastroesophageal reflux disease)    IBS (irritable bowel syndrome)    PCOS (polycystic ovarian syndrome)     Family History  Adopted: Yes    Social History   Socioeconomic History   Marital status: Single    Spouse name: Not on file   Number of children: Not on file   Years of education: Not on file   Highest education level: Not on file  Occupational History   Occupation: Bagger     Employer: [employer]  Tobacco Use   Smoking status: Never   Smokeless tobacco: Never  Vaping Use   Vaping status: Never Used  Substance and Sexual Activity   Alcohol use: No    Alcohol/week: 0.0 standard drinks of alcohol   Drug use: Never    Sexual activity: Not on file  Other Topics Concern   Not on file  Social History Narrative   ** Merged History Encounter **       Social Drivers of Health   Financial Resource Strain: Low Risk  (03/09/2021)   Received from [facility], [facility]   Overall Financial Resource Strain (CARDIA)    Difficulty of Paying Living Expenses: Not very hard  Food Insecurity: Unknown (03/09/2021)   Received from [facility], [facility]   Hunger Vital Sign    Worried About Running Out of Food in the Last Year: Patient declined    Ran Out of Food in the Last Year: Patient declined  Transportation Needs: No Transportation Needs (03/09/2021)   Received from [facility],  [facility]   PRAPARE - Administrator, Civil Service (Medical): No    Lack of Transportation (Non-Medical): No  Physical Activity: Insufficiently Active (03/09/2021)   Received from [facility], [facility]   Exercise Vital Sign    Days of Exercise per Week: 2 days    Minutes of Exercise per Session: 70 min  Stress: Stress Concern Present (03/09/2021)   Received from [facility], [facility] Occup[facility] - Occupational Stress Questionnaire    Feeling of Stress : Very much  Social Connections: Unknown (10/08/2021)   Received from [facility], [facility]   Social Network    Social Network: Not on file  Intimate Partner Violence: Unknown (08/30/2021)   Received from [facility], [facility]   HITS    Physically Hurt: Not on file    Insult or Talk Down To: Not on file    Threaten Physical Harm: Not on file    Scream or Curse: Not on file    Past Medical History, Surgical history, Social history, and Family history were reviewed and updated as appropriate.   Please see review of systems for further details on the patient's review from today.   Objective:   Physical Exam:  There were no vitals taken for this visit.  Physical Exam Constitutional:       General: She is not in acute distress. Musculoskeletal:        General: No deformity.  Neurological:     Mental Status: She is alert and oriented to person, place, and time.     Coordination: Coordination normal.  Psychiatric:        Attention and Perception: Attention and perception normal. She does not perceive auditory or visual hallucinations.        Mood and Affect: Mood normal. Mood is not anxious or depressed. Affect is not labile, blunt, angry or inappropriate.        Speech: Speech normal.        Behavior: Behavior normal.        Thought Content: Thought content normal. Thought content is not paranoid or delusional. Thought content does not include homicidal or suicidal ideation. Thought content does not include homicidal or suicidal plan.        Cognition and Memory: Cognition and memory normal.        Judgment: Judgment normal.     Comments: Insight intact     Lab Review:     Component Value Date/Time   NA 136 03/23/2023 1134   NA 139 03/05/2017 1055   K 4.2 03/23/2023 1134   CL 107 03/23/2023 1134   CO2 20 (L) 03/23/2023 1134   GLUCOSE 89 03/23/2023 1134   BUN 13 03/23/2023 1134   BUN 14 03/05/2017 1055   CREATININE 0.88 03/23/2023 1134   CALCIUM 9.5 03/23/2023 1134   PROT 7.1 03/23/2023 1134   PROT 6.9 03/05/2017 1055   ALBUMIN 4.1 03/23/2023 1134   ALBUMIN 4.2 03/05/2017 1055   AST 13 (L) 03/23/2023 1134   ALT 10 03/23/2023 1134   ALKPHOS 74 03/23/2023 1134   BILITOT 0.3 03/23/2023 1134   BILITOT 0.3 03/05/2017 1055   GFRNONAA >60 03/23/2023 1134   GFRAA 123 03/05/2017 1055       Component Value Date/Time   WBC 8.6 03/23/2023 1001   RBC 4.80 03/23/2023 1001   HGB 13.8 03/23/2023 1001   HGB 12.5 05/15/2016 1156   HCT 41.9 03/23/2023 1001   HCT  38.4 05/15/2016 1156   PLT 272 03/23/2023 1001   MCV 87.3 03/23/2023 1001   MCV 85 05/15/2016 1156   MCH 28.8 03/23/2023 1001   MCHC 32.9 03/23/2023 1001   RDW 14.2 03/23/2023 1001   RDW 14.1 05/15/2016  1156   LYMPHSABS 3.0 03/23/2023 1001   LYMPHSABS 2.9 05/15/2016 1156   MONOABS 0.5 03/23/2023 1001   EOSABS 0.1 03/23/2023 1001   EOSABS 0.1 05/15/2016 1156   BASOSABS 0.1 03/23/2023 1001   BASOSABS 0.1 05/15/2016 1156    No results found for: "POCLITH", "LITHIUM "   No results found for: "PHENYTOIN", "PHENOBARB", "VALPROATE", "CBMZ"   .res Assessment: Plan:    Plan:  Clomipramine  50mg  at hs - level taken in 03/2023 Prozac  40mg  daily Propranolol  10mg  TID Clonazepam  0.5mg  - 2 at hs Dextroamphetamine  10mg  daily when working Olanzapine  10mg  at hs for sleep and mood symptoms.  Therapist - Dorrine Gaudy  RTC 6 months  25 minutes spent dedicated to the care of this patient on the date of this encounter to include pre-visit review of records, ordering of medication, post visit documentation, and face-to-face time with the patient discussing ADHD, MDD, BED, panic disorder, emotional lability, and physiological insomnia. Discussed continuing current medication regimen.  Discussed potential benefits, risks, and side effects of stimulants with patient to include increased heart rate, palpitations, insomnia, increased anxiety, increased irritability, or decreased appetite.  Instructed patient to contact office if experiencing any significant tolerability issues.  Discussed potential benefits, risk, and side effects of benzodiazepines to include potential risk of tolerance and dependence, as well as possible drowsiness.  Advised patient not to drive if experiencing drowsiness and to take lowest possible effective dose to minimize risk of dependence and tolerance.  Patient advised to contact office with any questions, adverse effects, or acute worsening in signs and symptoms.  There are no diagnoses linked to this encounter.   Please see After Visit Summary for patient specific instructions.  Future Appointments  Date Time Provider Department Center  10/20/2023  4:00 PM Sumedh Shinsato, Ursula Gardner, NP CP-CP None  11/06/2023  4:00 PM Anthon Kins [facility] None  11/11/2023  3:00 PM Anthon Kins, [facility] None  11/20/2023  4:00 PM Anthon Kins, [facility] CP-CP None  11/25/2023  4:00 PM Anthon Kins, [facility] CP-[facility] None  12/04/2023  3:00 PM Anthon Kins, [facility] CP-CP None  12/09/2023  4:00 PM Anthon Kins, [facility] CP-CP None    No orders of the defined types were placed in this encounter.     -------------------------------

## 2023-11-03 ENCOUNTER — Other Ambulatory Visit: Payer: Self-pay | Admitting: Adult Health

## 2023-11-03 DIAGNOSIS — F321 Major depressive disorder, single episode, moderate: Secondary | ICD-10-CM

## 2023-11-06 ENCOUNTER — Encounter: Payer: Self-pay | Admitting: Professional Counselor

## 2023-11-06 ENCOUNTER — Ambulatory Visit: Admitting: Professional Counselor

## 2023-11-06 DIAGNOSIS — F411 Generalized anxiety disorder: Secondary | ICD-10-CM | POA: Diagnosis not present

## 2023-11-06 DIAGNOSIS — F3341 Major depressive disorder, recurrent, in partial remission: Secondary | ICD-10-CM

## 2023-11-06 NOTE — Progress Notes (Signed)
      [facility] Counselor/Therapist Progress Note  Patient ID: Laura Perez, MRN: [id number],    Date: 11/06/2023  Time Spent: 4:15 PM - 5:10 PM   Treatment Type: Individual Therapy  Reported Symptoms: irrational fears, worries, restlessness, nervousness  Mental Status Exam:  Appearance:   Casual     Behavior:  Appropriate, Sharing, and Motivated  Motor:  Normal  Speech/Language:   Clear and Coherent and Normal Rate  Affect:  Appropriate and Congruent  Mood:  normal  Thought process:  normal  Thought content:    WNL  Sensory/Perceptual disturbances:    WNL  Orientation:  oriented to person, place, time/date, and situation  Attention:  Good  Concentration:  Good  Memory:  WNL  Fund of knowledge:   Good  Insight:    Good  Judgment:   Good  Impulse Control:  Good   Risk Assessment: Danger to Self:  No Self-injurious Behavior: No Danger to Others: No Duty to Warn:no Physical Aggression / Violence:No  Access to Firearms a concern: No  Gang Involvement:No   Subjective: Patient presented to session to address concerns of anxiety, and depression in partial remission.  She reported progress.  Patient reported having cleaned her room after having had a break from work to rest and do chores as discussed last session.  She voiced feeling her room to no longer be a depression room and to be enjoying having her dog and mom come in instead of feeling the need to hide away.  She voiced routine as helping, and for Laura Perez (her anxiety) to be quiet lately.  She voiced having limited her stress buying by utilizing strategies from last session including setting a timer and talking to her mom when the urge strikes.  Patient processed her experience of irrational fears regarding her parents dying, and a sense of mild dissociation related to feeling that the world is not real and is a simulation when feeling anxious.  Counselor and patient discussed patient coping skills including taking  walks, [id number] coping skill, drinking a glass of water, counting backwards, listening and noticing and other grounding and relaxation techniques.  Counselor and patient discussed cognitive restructuring around transforming fears into gratitude by cherishing time with family and friends in the moment.  Interventions: Solution-Oriented/Positive Psychology, Humanistic/Existential, Psycho-education/Bibliotherapy, Insight-Oriented, and Relaxation Techniques, Resources  Diagnosis:   ICD-10-CM   1. Generalized anxiety disorder  F41.1     2. Major depressive disorder, recurrent episode, in partial remission (HCC)  F33.41       Plan: Pt is scheduled for a follow-up; continue process work and developing coping skills. STG between session to practice relaxation techniques discussed in session/per resource provided for take-home; to practice gratitude reframes to fears; continue to maintain clean room and car per personal goals.  Almarie ONEIDA Sprang, [facility]

## 2023-11-11 ENCOUNTER — Ambulatory Visit: Admitting: Professional Counselor

## 2023-11-20 ENCOUNTER — Encounter: Payer: Self-pay | Admitting: Professional Counselor

## 2023-11-20 ENCOUNTER — Ambulatory Visit: Admitting: Professional Counselor

## 2023-11-20 DIAGNOSIS — F411 Generalized anxiety disorder: Secondary | ICD-10-CM

## 2023-11-20 DIAGNOSIS — F330 Major depressive disorder, recurrent, mild: Secondary | ICD-10-CM

## 2023-11-20 NOTE — Progress Notes (Signed)
      [facility] Counselor/Therapist Progress Note  Patient ID: Maudene Stotler, MRN: [id number],    Date: 11/20/2023  Time Spent: 4:15 Pm - 5:11 PM   Treatment Type: Individual Therapy  Reported Symptoms: worries, stress, fatigue, interpersonal concerns, low mood, irritability, restlessness  Mental Status Exam:  Appearance:   Casual     Behavior:  Appropriate, Sharing, and Motivated  Motor:  Normal  Speech/Language:   Clear and Coherent and Normal Rate  Affect:  Appropriate and Congruent  Mood:  normal  Thought process:  normal  Thought content:    WNL  Sensory/Perceptual disturbances:    WNL  Orientation:  oriented to person, place, time/date, and situation  Attention:  Good  Concentration:  Good  Memory:  WNL  Fund of knowledge:   Good  Insight:    Good  Judgment:   Good  Impulse Control:  Good   Risk Assessment: Danger to Self:  No Self-injurious Behavior: No Danger to Others: No Duty to Warn:no Physical Aggression / Violence:No  Access to Firearms a concern: No  Gang Involvement:No   Subjective: Patient presented to session to address concerns of anxiety and depression.  She reported mixed progress at this time.  Patient processed the experience of missing a friend who she had blocked because of negative interactions that exacerbated patient's self-esteem concerns.  Patient worked to discern her varying thoughts and feelings between her intuition, heart and mind, and counselor facilitated an expressive arts therapy activity whereby patient identified that her gut says not to reconcile with her friend because she feels likely to get hurt, her heart however wanting to reconcile because she misses her friend and gets to enjoy the adult part of herself with this friend, and her mind conveying that the second chance is reasonable but that it is also reasonable that she feels low trust in terms of an effective reconciliation.  Counselor assisted patient in processing her  thoughts and feelings and discernment, and helped facilitate meaning making and insight as relates these concerns and patient expressive arts therapy content.  Interventions: Solution-Oriented/Positive Psychology, Humanistic/Existential, Insight-Oriented, Interpersonal, and Expressive Arts Therapy  Diagnosis:   ICD-10-CM   1. Generalized anxiety disorder  F41.1     2. Major depressive disorder, recurrent episode, mild (HCC)  F33.0       Plan: Pt is scheduled for a follow-up; continue process work and developing coping skills. STG between sessions to continue to discern and reflect around interpersonal matter of concern, and if she chooses to approach difficult conversation regarding reconciliation, to prioritize emotional safety and voicing of her needs.  Almarie ONEIDA Sprang, [facility]

## 2023-11-25 ENCOUNTER — Ambulatory Visit: Admitting: Professional Counselor

## 2023-12-04 ENCOUNTER — Ambulatory Visit: Admitting: Professional Counselor

## 2023-12-07 ENCOUNTER — Ambulatory Visit: Admitting: Professional Counselor

## 2023-12-09 ENCOUNTER — Ambulatory Visit: Admitting: Professional Counselor

## 2023-12-23 ENCOUNTER — Other Ambulatory Visit: Payer: Self-pay

## 2023-12-23 ENCOUNTER — Telehealth: Payer: Self-pay | Admitting: Adult Health

## 2023-12-23 DIAGNOSIS — F902 Attention-deficit hyperactivity disorder, combined type: Secondary | ICD-10-CM

## 2023-12-23 MED ORDER — DEXTROAMPHETAMINE SULFATE 10 MG PO TABS
10.0000 mg | ORAL_TABLET | Freq: Every day | ORAL | 0 refills | Status: DC
Start: 2023-12-23 — End: 2024-02-19

## 2023-12-23 MED ORDER — DEXTROAMPHETAMINE SULFATE 10 MG PO TABS
10.0000 mg | ORAL_TABLET | Freq: Every day | ORAL | 0 refills | Status: DC
Start: 2024-01-20 — End: 2024-02-19

## 2023-12-23 NOTE — Telephone Encounter (Signed)
 Patient called in for refill on Vyvanse . Ph: [phone] Pharmacy [facility] [address] [place]

## 2023-12-23 NOTE — Telephone Encounter (Signed)
 Pt isn't on Vyvanse . Pended RF for Dextroamphetamine  10 mg.

## 2023-12-24 ENCOUNTER — Ambulatory Visit: Admitting: Professional Counselor

## 2023-12-29 ENCOUNTER — Ambulatory Visit (INDEPENDENT_AMBULATORY_CARE_PROVIDER_SITE_OTHER): Admitting: Professional Counselor

## 2023-12-29 ENCOUNTER — Encounter: Payer: Self-pay | Admitting: Professional Counselor

## 2023-12-29 DIAGNOSIS — F411 Generalized anxiety disorder: Secondary | ICD-10-CM | POA: Diagnosis not present

## 2023-12-29 DIAGNOSIS — F330 Major depressive disorder, recurrent, mild: Secondary | ICD-10-CM | POA: Diagnosis not present

## 2023-12-29 NOTE — Progress Notes (Unsigned)
      [facility] Counselor/Therapist Progress Note  Patient ID: Laura Perez, MRN: [id number],    Date: 12/29/2023  Time Spent: 2:08 PM to 3:05 PM  Treatment Type: Individual Therapy  Reported Symptoms: Excess sleep, fatigue, irritability, frustration, stress, grief/loss, worries, preoccupying thoughts, interpersonal concerns  Mental Status Exam:  Appearance:   Casual     Behavior:  Appropriate and Sharing  Motor:  Normal  Speech/Language:   Clear and Coherent and Normal Rate  Affect:  Tearful  Mood:  sad  Thought process:  normal  Thought content:    WNL  Sensory/Perceptual disturbances:    WNL  Orientation:  oriented to person, place, time/date, and situation  Attention:  Good  Concentration:  Good  Memory:  WNL  Fund of knowledge:   Good  Insight:    Good  Judgment:   Good  Impulse Control:  Good   Risk Assessment: Danger to Self:  No Self-injurious Behavior: No Danger to Others: No Duty to Warn:no Physical Aggression / Violence:No  Access to Firearms a concern: No  Gang Involvement:No   Subjective: Patient presented to session to address concerns of anxiety and depression.  She reported mixed progress at this time.  She continued to process experience of past friendship and discernment process with her family around reconciling issues.  Patient grieved the loss of the friendship, and processed her understanding of why her family has discouraged the friendship, including for safety reasons.  Counselor actively listened, affirmed patient feelings and experience, and reinforced safety concerns.  Patient identified work stress and need to contribute to her family as considerable stressors in her life at this time.  Counselor facilitated expressive arts activity whereby patient identified on the 1 hand what she wishes to let go of in her life, and on the other what she looks forward to in the future.  Patient identified grief/loss of her ESA pet by hx, fear of losing her  parents, having lost her friendship with 1 friend, and stress around her benefits/work as what she wants to let go of, and hopes for the future as being: to be more independent, having less stress, and being with her boyfriend to an increased capacity in terms of living status, etc. Counselor and patient discussed patient reflections, and patient assisted with DBT accepts to help develop patient coping skill set per resourcing: Activities to boost joy, Contributing to others wellbeing, Comparisons (gratitude), Emotion shifting, Pushing away negativity, Thoughts focused differently, and Sensations work (grounding/breath work exercises).  Interventions: Dialectical Behavioral Therapy, Solution-Oriented/Positive Psychology, Humanistic/Existential, Insight-Oriented, and Grief Therapy, Expressive Arts Intervention  Diagnosis:   ICD-10-CM   1. Generalized anxiety disorder  F41.1     2. Major depressive disorder, recurrent episode, mild (HCC)  F33.0       Plan: Patient is scheduled for follow-up; continue process work and developing coping skills.  STG between sessions for patient to practice accepts mindfulness per DBT skill set as discussed in session, including as relates balancing work/life stress.  Almarie ONEIDA Sprang, [facility]

## 2024-01-07 ENCOUNTER — Telehealth: Payer: Self-pay

## 2024-01-07 NOTE — Telephone Encounter (Signed)
 Pt reporting for the last 3 weeks she has been in a panic state/having a mental break. Taking meds as prescribed. Sleeping well. Cries a lot. Reports leaving work today due to [employer]. No new stressors. Last visit 5/27.

## 2024-01-07 NOTE — Telephone Encounter (Signed)
 Please call to schedule appt, would like first available.

## 2024-01-08 NOTE — Telephone Encounter (Signed)
 MyChart message sent that Tillman wanted an appt to discuss and she was scheduled for an earlier appt.

## 2024-01-08 NOTE — Telephone Encounter (Signed)
 Patient has appt 8/28.

## 2024-01-15 ENCOUNTER — Ambulatory Visit: Admitting: Adult Health

## 2024-01-15 ENCOUNTER — Encounter: Payer: Self-pay | Admitting: Adult Health

## 2024-01-15 DIAGNOSIS — F330 Major depressive disorder, recurrent, mild: Secondary | ICD-10-CM | POA: Diagnosis not present

## 2024-01-15 DIAGNOSIS — Z79899 Other long term (current) drug therapy: Secondary | ICD-10-CM | POA: Diagnosis not present

## 2024-01-15 DIAGNOSIS — F902 Attention-deficit hyperactivity disorder, combined type: Secondary | ICD-10-CM | POA: Diagnosis not present

## 2024-01-15 DIAGNOSIS — F411 Generalized anxiety disorder: Secondary | ICD-10-CM | POA: Diagnosis not present

## 2024-01-15 NOTE — Progress Notes (Signed)
 Laura Perez [id number] April 19, 1991 33 y.o.  Subjective:   Patient ID:  Laura Perez is a 33 y.o. (DOB 07/14/1990) female.  Chief Complaint: No chief complaint on file.   HPI Laura Perez presents to the office today for follow-up of ADHD, MDD, BED, panic disorder, emotional lability, and physiological insomnia.  Describes mood today as not too good. Pleasant. Reports tearfulness. Mood symptoms - reports anxiety, depression and irritability. Reports decreased interest and motivation. Reports one recent panic attacks. Reports some worry - work related. Reports rumination and over thinking. Reports obsessive thoughts. Denies reckless behaviors - over spending. Reports mood as lower. Stating I don't feel like I'm doing too good.  Reports medications are helpful - has not been taking the Olanzapine . Taking other medications as prescribed.  Energy levels lower. Active, does not have a regular exercise routine. Walking at work. Unable to enjoy usual interests and activities. Dating. Has a boyfriend. Lives with parents - 2 dogs and 2 cats. Spending time with family and her best friends.  Appetite adequate. Weight loss - 224 pounds. Reports sleep has improved. Averages 5 to 6 hours. Reports improved focus and concentration. Completing tasks. Managing aspects of household. Working at [employer] part time.   Denies SI or HI.  Denies AH or VH.  Denies self harm. Denies substance use.  Previous medication trials: Zoloft, lamictal , Celexa, Elavil   GAD-7    Flowsheet Row Counselor from 08/06/2023 in [facility] Counselor from 06/11/2023 in [facility]  Total GAD-7 Score 3 19   PHQ2-9    Flowsheet Row Counselor from 08/06/2023 in [facility] Counselor from 06/11/2023 in [facility] Office Visit from 05/13/2016 in [facility]  Total Score 1 6 4   PHQ-9 Total Score 8 24 16    Flowsheet Row ED from 03/23/2023 in [facility] Emergency Department at [facility] ED from 05/23/2022 in [facility] Emergency Department at [facility] ED from 04/30/2022 in [facility] Emergency Department at [facility]  C-SSRS RISK CATEGORY No Risk No Risk No Risk     Review of Systems:  Review of Systems  Musculoskeletal:  Negative for gait problem.  Neurological:  Negative for tremors.  Psychiatric/Behavioral:         Please refer to HPI    Medications: I have reviewed the patient's current medications.  Current Outpatient Medications  Medication Sig Dispense Refill   beclomethasone (QVAR) 80 MCG/ACT inhaler Inhale 1 puff into the lungs daily as needed (wheezing).     clomiPRAMINE  (ANAFRANIL ) 50 MG capsule Take 1 capsule (50 mg total) by mouth at bedtime. 30 capsule 2   clonazePAM  (KLONOPIN ) 0.5 MG tablet Take 1 tablet (0.5 mg total) by mouth 2 (two) times daily. 60 tablet 2   dextroamphetamine  (DEXTROSTAT ) 10 MG tablet Take 1 tablet (10 mg total) by mouth daily. 30 tablet 0   [START ON 01/20/2024] dextroamphetamine  (DEXTROSTAT ) 10 MG tablet Take 1 tablet (10 mg total) by mouth daily. 30 tablet 0   diclofenac  (VOLTAREN ) 75 MG EC tablet Take 1 tablet (75 mg total) by mouth 2 (two) times daily. (Patient not taking: No sig reported) 60 tablet 1   dicyclomine  (BENTYL ) 20 MG tablet Take 1 tablet (20 mg total) by mouth 2 (two) times daily. 20 tablet 0   FLUoxetine  (PROZAC ) 40 MG capsule Take 1 capsule (40 mg total) by mouth at bedtime. 30 capsule 2  hydrocortisone  (ANUSOL -HC) 25 MG suppository Place 1 suppository (25 mg total) rectally 2 (two) times daily. 12 suppository 0   Ibuprofen -diphenhydrAMINE Cit (ADVIL  PM PO) Take 2 tablets by mouth daily as needed (pain).     loratadine  (CLARITIN ) 10 MG tablet Take 1 tablet (10 mg total) by mouth daily. 15 tablet 0   metFORMIN (GLUCOPHAGE-XR) 500 MG 24 hr tablet Take 1,000  mg by mouth 2 (two) times daily.     metoCLOPramide  (REGLAN ) 10 MG tablet Take 1 tablet (10 mg total) by mouth every 6 (six) hours. 8 tablet 0   NIKKI 3-0.02 MG tablet Take 1 tablet by mouth daily.     NURTEC 75 MG TBDP Take 1 tablet by mouth daily as needed.     OLANZapine  (ZYPREXA ) 10 MG tablet Take 1 tablet (10 mg total) by mouth at bedtime. 30 tablet 2   ondansetron  (ZOFRAN  ODT) 4 MG disintegrating tablet Take one tab by mouth Q6hr prn nausea (Dissolve under tongue) (Patient taking differently: Take 4 mg by mouth every 8 (eight) hours as needed for nausea or vomiting.) 12 tablet 0   OZEMPIC, 0.25 OR 0.5 MG/DOSE, 2 MG/1.5ML SOPN Inject 0.5 mg into the skin once a week. Friday     pantoprazole  (PROTONIX ) 20 MG tablet Take 1 tablet (20 mg total) by mouth daily. 20 tablet 0   propranolol  (INDERAL ) 10 MG tablet Take 1 tablet (10 mg total) by mouth 3 (three) times daily as needed. 90 tablet 2   topiramate (TOPAMAX) 25 MG tablet Take 25 mg by mouth 2 (two) times daily.     VIBERZI 100 MG TABS Take 1 tablet by mouth 2 (two) times daily.     No current facility-administered medications for this visit.    Medication Side Effects: None  Allergies:  Allergies  Allergen Reactions   Imitrex [Sumatriptan] Shortness Of Breath    Past Medical History:  Diagnosis Date   ADHD    Anxiety    Asthma    Depression    GERD (gastroesophageal reflux disease)    IBS (irritable bowel syndrome)    PCOS (polycystic ovarian syndrome)     Past Medical History, Surgical history, Social history, and Family history were reviewed and updated as appropriate.   Please see review of systems for further details on the patient's review from today.   Objective:   Physical Exam:  There were no vitals taken for this visit.  Physical Exam Constitutional:      General: She is not in acute distress. Musculoskeletal:        General: No deformity.  Neurological:     Mental Status: She is alert and oriented to  person, place, and time.     Coordination: Coordination normal.  Psychiatric:        Attention and Perception: Attention and perception normal. She does not perceive auditory or visual hallucinations.        Mood and Affect: Mood normal. Mood is not anxious or depressed. Affect is not labile, blunt, angry or inappropriate.        Speech: Speech normal.        Behavior: Behavior normal.        Thought Content: Thought content normal. Thought content is not paranoid or delusional. Thought content does not include homicidal or suicidal ideation. Thought content does not include homicidal or suicidal plan.        Cognition and Memory: Cognition and memory normal.        Judgment: Judgment  normal.     Comments: Insight intact     Lab Review:     Component Value Date/Time   NA 136 03/23/2023 1134   NA 139 03/05/2017 1055   K 4.2 03/23/2023 1134   CL 107 03/23/2023 1134   CO2 20 (L) 03/23/2023 1134   GLUCOSE 89 03/23/2023 1134   BUN 13 03/23/2023 1134   BUN 14 03/05/2017 1055   CREATININE 0.88 03/23/2023 1134   CALCIUM 9.5 03/23/2023 1134   PROT 7.1 03/23/2023 1134   PROT 6.9 03/05/2017 1055   ALBUMIN 4.1 03/23/2023 1134   ALBUMIN 4.2 03/05/2017 1055   AST 13 (L) 03/23/2023 1134   ALT 10 03/23/2023 1134   ALKPHOS 74 03/23/2023 1134   BILITOT 0.3 03/23/2023 1134   BILITOT 0.3 03/05/2017 1055   GFRNONAA >60 03/23/2023 1134   GFRAA 123 03/05/2017 1055       Component Value Date/Time   WBC 8.6 03/23/2023 1001   RBC 4.80 03/23/2023 1001   HGB 13.8 03/23/2023 1001   HGB 12.5 05/15/2016 1156   HCT 41.9 03/23/2023 1001   HCT 38.4 05/15/2016 1156   PLT 272 03/23/2023 1001   MCV 87.3 03/23/2023 1001   MCV 85 05/15/2016 1156   MCH 28.8 03/23/2023 1001   MCHC 32.9 03/23/2023 1001   RDW 14.2 03/23/2023 1001   RDW 14.1 05/15/2016 1156   LYMPHSABS 3.0 03/23/2023 1001   LYMPHSABS 2.9 05/15/2016 1156   MONOABS 0.5 03/23/2023 1001   EOSABS 0.1 03/23/2023 1001   EOSABS 0.1 05/15/2016  1156   BASOSABS 0.1 03/23/2023 1001   BASOSABS 0.1 05/15/2016 1156    No results found for: POCLITH, LITHIUM    No results found for: PHENYTOIN, PHENOBARB, VALPROATE, CBMZ   .res Assessment: Plan:    Plan:  Clomipramine  50mg  at hs - requested lab 01/15/2024 Prozac  40mg  daily Propranolol  10mg  TID Clonazepam  0.5mg  - 2 at hs Dextroamphetamine  10mg  daily when working  Restart Olanzapine  10mg  at hs for sleep and mood symptoms - patient quit taking medication at least a month ago.  Therapist - Almarie Sprang  RTC 6 months  25 minutes spent dedicated to the care of this patient on the date of this encounter to include pre-visit review of records, ordering of medication, post visit documentation, and face-to-face time with the patient discussing ADHD, MDD, BED, panic disorder, emotional lability, and physiological insomnia. Discussed continuing current medication regimen.  Discussed potential benefits, risks, and side effects of stimulants with patient to include increased heart rate, palpitations, insomnia, increased anxiety, increased irritability, or decreased appetite.  Instructed patient to contact office if experiencing any significant tolerability issues.  Discussed potential benefits, risk, and side effects of benzodiazepines to include potential risk of tolerance and dependence, as well as possible drowsiness.  Advised patient not to drive if experiencing drowsiness and to take lowest possible effective dose to minimize risk of dependence and tolerance.  Patient advised to contact office with any questions, adverse effects, or acute worsening in signs and symptoms.  There are no diagnoses linked to this encounter.   Please see After Visit Summary for patient specific instructions.  Future Appointments  Date Time Provider Department Center  02/12/2024  2:00 PM Ginamarie Banfield, Angeline Mattocks, NP CP-[facility] None  04/01/2024 11:00 AM Sprang Almarie DASEN, [facility] None     No orders of the defined types were placed in this encounter.   -------------------------------

## 2024-01-19 ENCOUNTER — Other Ambulatory Visit: Payer: Self-pay | Admitting: Internal Medicine

## 2024-01-19 DIAGNOSIS — R1013 Epigastric pain: Secondary | ICD-10-CM

## 2024-01-19 DIAGNOSIS — R7401 Elevation of levels of liver transaminase levels: Secondary | ICD-10-CM

## 2024-01-21 ENCOUNTER — Telehealth: Admitting: Adult Health

## 2024-01-21 ENCOUNTER — Other Ambulatory Visit: Payer: Self-pay | Admitting: Adult Health

## 2024-01-21 DIAGNOSIS — F321 Major depressive disorder, single episode, moderate: Secondary | ICD-10-CM

## 2024-01-22 ENCOUNTER — Ambulatory Visit
Admission: RE | Admit: 2024-01-22 | Discharge: 2024-01-22 | Disposition: A | Discharge status: Home / Self Care | Source: Ambulatory Visit | Attending: Internal Medicine | Admitting: Internal Medicine

## 2024-01-22 DIAGNOSIS — R1013 Epigastric pain: Secondary | ICD-10-CM

## 2024-01-22 DIAGNOSIS — R7401 Elevation of levels of liver transaminase levels: Secondary | ICD-10-CM

## 2024-01-25 ENCOUNTER — Other Ambulatory Visit: Payer: Self-pay | Admitting: Adult Health

## 2024-01-25 DIAGNOSIS — F321 Major depressive disorder, single episode, moderate: Secondary | ICD-10-CM

## 2024-01-26 ENCOUNTER — Ambulatory Visit: Admitting: Professional Counselor

## 2024-02-03 ENCOUNTER — Other Ambulatory Visit: Payer: Self-pay | Admitting: Adult Health

## 2024-02-03 DIAGNOSIS — F321 Major depressive disorder, single episode, moderate: Secondary | ICD-10-CM

## 2024-02-12 ENCOUNTER — Ambulatory Visit: Admitting: Adult Health

## 2024-02-15 ENCOUNTER — Telehealth: Payer: Self-pay | Admitting: Adult Health

## 2024-02-15 NOTE — Telephone Encounter (Signed)
 Pharmacy called on the request of the patient. She has lost her klonopin  and she is asking for gina to approve an early refill. The pharmacy is [facility] please call and let them know if the refill is approved

## 2024-02-16 NOTE — Telephone Encounter (Signed)
 Pharmacy called, reporting patient lost her clonazepam  and asking for an early RF. LF 9/5, due 10/3. I called pt this morning to see if she had found medication and she has not. She has RF available.

## 2024-02-16 NOTE — Telephone Encounter (Addendum)
 Called pharmacy to authorize early RF for clonazepam . Insurance will not cover and they will use GoodRx.

## 2024-02-18 ENCOUNTER — Telehealth: Payer: Self-pay | Admitting: Adult Health

## 2024-02-18 ENCOUNTER — Ambulatory Visit (INDEPENDENT_AMBULATORY_CARE_PROVIDER_SITE_OTHER): Admitting: Professional Counselor

## 2024-02-18 DIAGNOSIS — F3341 Major depressive disorder, recurrent, in partial remission: Secondary | ICD-10-CM

## 2024-02-18 DIAGNOSIS — F411 Generalized anxiety disorder: Secondary | ICD-10-CM

## 2024-02-18 NOTE — Progress Notes (Signed)
      [facility] Counselor/Therapist Progress Note  Patient ID: Laura Perez, MRN: [id number],    Date: 02/18/2024  Time Spent: 11:06 AM through 11:56 AM  Treatment Type: Family with Patient   Patient presented to session with her mother.  Reported Symptoms: Grief/loss, health concerns, interpersonal concerns, stress, anxiousness, worries, motivational concerns, fatigue  Mental Status Exam:  Appearance:   Casual     Behavior:  Appropriate, Sharing, and Motivated  Motor:  Normal  Speech/Language:   Clear and Coherent and Normal Rate  Affect:  Appropriate and Congruent  Mood:  normal  Thought process:  normal  Thought content:    WNL  Sensory/Perceptual disturbances:    WNL  Orientation:  oriented to person, place, time/date, and situation  Attention:  Good  Concentration:  Good  Memory:  WNL  Fund of knowledge:   Good  Insight:    Good  Judgment:   Good  Impulse Control:  Good   Risk Assessment: Danger to Self:  No Self-injurious Behavior: No Danger to Others: No Duty to Warn:no Physical Aggression / Violence:No  Access to Firearms a concern: No  Gang Involvement:No   Subjective: Patient presented to session to address concerns of anxiety.  She presented to session with her mother.  She processed the experience of having been sick recently and having been out of work.  She reported illness to have been related to weight loss medication, and for issue to be resolved.  Patient identified new initiatives, and patient and patient parent discussed ways in which they could collaborate toward shared goals, such as cooking together and following healthy diet together, and prioritizing exercise.  Counselor shared resources regarding exercise opportunities.  Patient processed experience of having lost her cat and her grandmother taking a fall, and experience of reconnecting with friend with whom her parents had concerns.  Counseling helped to facilitate insight and develop  strategies around safety in reconciliation process, reinforced with patient parent, including safe choices of outlets to connect, and pacing/balance of friendship trajectory.    Interventions: Solution-Oriented/Positive Psychology, Humanistic/Existential, and Resourcing  Diagnosis:   ICD-10-CM   1. Generalized anxiety disorder  F41.1     2. Major depressive disorder, recurrent episode, in partial remission  F33.41       Plan: Patient is scheduled for follow-up; continue process work and developing coping skills.  STG between sessions for patient and patient parent to look into exercise opportunities, continue with paced in safe reconciliation with friend, continue with proactive and healthy regimens to enhance holistic wellbeing.  Laura Perez, [facility]

## 2024-02-18 NOTE — Telephone Encounter (Addendum)
 Pt is asking to switch from Dextrostat  to Vyvanse  70 mg. She reports one of her doctors recommended the Vyvanse  to help with weight loss and she said she is working longer hours. She has not been taking the Dextrostat  daily because she has not been filling it regularly. She had been on 60 mg when she was seeing Dr. Tonnie and he noted SE. When I asked her about this she said it made her lethargic, but you have prescribed 70 mg in the past.

## 2024-02-18 NOTE — Telephone Encounter (Signed)
 Pt checked out of appt today at 12p with [facility].  She asked if she could stop taking the Dextrostat  and go back on Vyvanse  70mg , which she has taken before.  If ok, send to   [facility] [id number], [place] - [address], [place] [address] Phone: [phone]  Fax: [phone]   She said it's ok to LVM on her phone.  Next appt 11/19

## 2024-02-19 ENCOUNTER — Telehealth: Payer: Self-pay

## 2024-02-19 ENCOUNTER — Other Ambulatory Visit: Payer: Self-pay

## 2024-02-19 DIAGNOSIS — F902 Attention-deficit hyperactivity disorder, combined type: Secondary | ICD-10-CM

## 2024-02-19 MED ORDER — LISDEXAMFETAMINE DIMESYLATE 30 MG PO CAPS: 30.0000 mg | ORAL_CAPSULE | Freq: Every day | ORAL | 0 refills | Status: DC

## 2024-02-19 NOTE — Telephone Encounter (Signed)
 Pt is Medicaid. Does not require PA if brand. Called pharmacy and they were able to change Rx to brand and it went thru.

## 2024-02-26 ENCOUNTER — Ambulatory Visit: Admitting: Professional Counselor

## 2024-03-01 ENCOUNTER — Telehealth: Payer: Self-pay | Admitting: Adult Health

## 2024-03-01 NOTE — Telephone Encounter (Signed)
 Pt called and said that she has been on the vyvanse  30 mg. She is having a hard time at work focusing and getting her work done. She would to increase the vyvanse . She said that she use to be on 70 mg.

## 2024-03-01 NOTE — Telephone Encounter (Signed)
 Please see message. LF 9/26.

## 2024-03-06 NOTE — Telephone Encounter (Signed)
 LF 30 mg , due 10/24.

## 2024-03-07 NOTE — Telephone Encounter (Signed)
 Pt notified that Vyvanse  50 mg would be sent when next RF due and no other changes until appt.

## 2024-03-08 ENCOUNTER — Ambulatory Visit (INDEPENDENT_AMBULATORY_CARE_PROVIDER_SITE_OTHER): Admitting: Adult Health

## 2024-03-08 ENCOUNTER — Encounter: Payer: Self-pay | Admitting: Adult Health

## 2024-03-08 DIAGNOSIS — F902 Attention-deficit hyperactivity disorder, combined type: Secondary | ICD-10-CM | POA: Diagnosis not present

## 2024-03-08 DIAGNOSIS — Z79899 Other long term (current) drug therapy: Secondary | ICD-10-CM | POA: Diagnosis not present

## 2024-03-08 DIAGNOSIS — F321 Major depressive disorder, single episode, moderate: Secondary | ICD-10-CM

## 2024-03-08 DIAGNOSIS — F411 Generalized anxiety disorder: Secondary | ICD-10-CM

## 2024-03-08 DIAGNOSIS — F410 Panic disorder [episodic paroxysmal anxiety] without agoraphobia: Secondary | ICD-10-CM

## 2024-03-08 MED ORDER — OLANZAPINE 10 MG PO TABS: 10.0000 mg | ORAL_TABLET | Freq: Every day | ORAL | 2 refills | Status: AC

## 2024-03-08 MED ORDER — LISDEXAMFETAMINE DIMESYLATE 30 MG PO CAPS: 30.0000 mg | ORAL_CAPSULE | Freq: Every day | ORAL | 0 refills | Status: DC

## 2024-03-08 MED ORDER — LISDEXAMFETAMINE DIMESYLATE 60 MG PO CAPS: 60.0000 mg | ORAL_CAPSULE | Freq: Every day | ORAL | 0 refills | Status: AC

## 2024-03-08 NOTE — Progress Notes (Signed)
 Ranetta Armacost [id number] [birth year] 33 y.o.  Subjective:   Patient ID:  Laura Perez is a 33 y.o. (DOB 12/03/1990) female.  Chief Complaint: No chief complaint on file.   HPI Helaina Stefano presents to the office today for follow-up of ADHD, MDD, BED, panic disorder, emotional lability, and physiological insomnia.  Describes mood today as not good. Pleasant. Reports tearfulness. Mood symptoms - reports anxiety, depression and irritability. Reports decreased interest and motivation. Reports one recent panic attacks. Reports some worry - work related. Reports rumination and over thinking. Reports obsessive thoughts. Denies reckless behaviors - over spending. Reports mood as lower. Stating I don't feel like I'm doing too good.  Reports medications are helpful - has not been taking the Olanzapine . Taking other medications as prescribed.  Energy levels lower. Active, does not have a regular exercise routine. Walking at work. Unable to enjoy usual interests and activities. Dating. Has a boyfriend. Lives with parents - 2 dogs and 1 cats. Spending time with family and her best friends.  Appetite adequate. Weight gain. Reports sleep has improved. Averages 8 hours. Reports improved focus and concentration. Completing tasks. Managing aspects of household. Working at [employer] part time.   Denies SI or HI.  Denies AH or VH.  Denies self harm. Denies substance use.  Previous medication trials: Zoloft, lamictal , Celexa, Elavil    GAD-7    Flowsheet Row Counselor from 08/06/2023 in [facility] Counselor from 06/11/2023 in [facility]  Total GAD-7 Score 3 19   PHQ2-9    Flowsheet Row Counselor from 08/06/2023 in [facility] Counselor from 06/11/2023 in [facility] Office Visit from 05/13/2016 in [facility] Total Score 1 6 4    PHQ-9 Total Score 8 24 16    Flowsheet Row ED from 03/23/2023 in [facility] Emergency Department at [facility] ED from 05/23/2022 in [facility] Emergency Department at [facility] ED from 04/30/2022 in [facility] Emergency Department at [facility]  C-SSRS RISK CATEGORY No Risk No Risk No Risk     Review of Systems:  Review of Systems  Musculoskeletal:  Negative for gait problem.  Neurological:  Negative for tremors.  Psychiatric/Behavioral:         Please refer to HPI    Medications: I have reviewed the patient's current medications.  Current Outpatient Medications  Medication Sig Dispense Refill   beclomethasone (QVAR) 80 MCG/ACT inhaler Inhale 1 puff into the lungs daily as needed (wheezing).     clomiPRAMINE  (ANAFRANIL ) 50 MG capsule TAKE 1 CAPSULE BY MOUTH AT BEDTIME 90 capsule 0   clonazePAM  (KLONOPIN ) 0.5 MG tablet TAKE 1 TABLET BY MOUTH 2 TIMES A DAY 60 tablet 2   diclofenac  (VOLTAREN ) 75 MG EC tablet Take 1 tablet (75 mg total) by mouth 2 (two) times daily. (Patient not taking: No sig reported) 60 tablet 1   dicyclomine  (BENTYL ) 20 MG tablet Take 1 tablet (20 mg total) by mouth 2 (two) times daily. 20 tablet 0   FLUoxetine  (PROZAC ) 40 MG capsule TAKE 1 CAPSULE BY MOUTH AT BEDTIME 90 capsule 1   hydrocortisone  (ANUSOL -HC) 25 MG suppository Place 1 suppository (25 mg total) rectally 2 (two) times daily. 12 suppository 0   Ibuprofen -diphenhydrAMINE Cit (ADVIL  PM PO) Take 2 tablets by mouth daily as needed (pain).     lisdexamfetamine (VYVANSE ) 30 MG capsule Take 1 capsule (30 mg total) by mouth daily.  30 capsule 0   loratadine  (CLARITIN ) 10 MG tablet Take 1 tablet (10 mg total) by mouth daily. 15 tablet 0   metFORMIN (GLUCOPHAGE-XR) 500 MG 24 hr tablet Take 1,000 mg by mouth 2 (two) times daily.     metoCLOPramide  (REGLAN ) 10 MG tablet Take 1 tablet (10 mg total) by mouth every 6 (six) hours. 8 tablet 0   NIKKI 3-0.02 MG tablet Take 1 tablet by mouth  daily.     NURTEC 75 MG TBDP Take 1 tablet by mouth daily as needed.     OLANZapine  (ZYPREXA ) 10 MG tablet Take 1 tablet (10 mg total) by mouth at bedtime. 30 tablet 2   ondansetron  (ZOFRAN  ODT) 4 MG disintegrating tablet Take one tab by mouth Q6hr prn nausea (Dissolve under tongue) (Patient taking differently: Take 4 mg by mouth every 8 (eight) hours as needed for nausea or vomiting.) 12 tablet 0   OZEMPIC, 0.25 OR 0.5 MG/DOSE, 2 MG/1.5ML SOPN Inject 0.5 mg into the skin once a week. Friday     pantoprazole  (PROTONIX ) 20 MG tablet Take 1 tablet (20 mg total) by mouth daily. 20 tablet 0   propranolol  (INDERAL ) 10 MG tablet Take 1 tablet (10 mg total) by mouth 3 (three) times daily as needed. 90 tablet 2   topiramate (TOPAMAX) 25 MG tablet Take 25 mg by mouth 2 (two) times daily.     VIBERZI 100 MG TABS Take 1 tablet by mouth 2 (two) times daily.     No current facility-administered medications for this visit.    Medication Side Effects: None  Allergies:  Allergies  Allergen Reactions   Imitrex [Sumatriptan] Shortness Of Breath    Past Medical History:  Diagnosis Date   ADHD    Anxiety    Asthma    Depression    GERD (gastroesophageal reflux disease)    IBS (irritable bowel syndrome)    PCOS (polycystic ovarian syndrome)     Past Medical History, Surgical history, Social history, and Family history were reviewed and updated as appropriate.   Please see review of systems for further details on the patient's review from today.   Objective:   Physical Exam:  There were no vitals taken for this visit.  Physical Exam Constitutional:      General: She is not in acute distress. Musculoskeletal:        General: No deformity.  Neurological:     Mental Status: She is alert and oriented to person, place, and time.     Coordination: Coordination normal.  Psychiatric:        Attention and Perception: Attention and perception normal. She does not perceive auditory or visual  hallucinations.        Mood and Affect: Mood normal. Mood is not anxious or depressed. Affect is not labile, blunt, angry or inappropriate.        Speech: Speech normal.        Behavior: Behavior normal.        Thought Content: Thought content normal. Thought content is not paranoid or delusional. Thought content does not include homicidal or suicidal ideation. Thought content does not include homicidal or suicidal plan.        Cognition and Memory: Cognition and memory normal.        Judgment: Judgment normal.     Comments: Insight intact     Lab Review:     Component Value Date/Time   NA 136 03/23/2023 1134   NA 139 03/05/2017 1055  K 4.2 03/23/2023 1134   CL 107 03/23/2023 1134   CO2 20 (L) 03/23/2023 1134   GLUCOSE 89 03/23/2023 1134   BUN 13 03/23/2023 1134   BUN 14 03/05/2017 1055   CREATININE 0.88 03/23/2023 1134   CALCIUM 9.5 03/23/2023 1134   PROT 7.1 03/23/2023 1134   PROT 6.9 03/05/2017 1055   ALBUMIN 4.1 03/23/2023 1134   ALBUMIN 4.2 03/05/2017 1055   AST 13 (L) 03/23/2023 1134   ALT 10 03/23/2023 1134   ALKPHOS 74 03/23/2023 1134   BILITOT 0.3 03/23/2023 1134   BILITOT 0.3 03/05/2017 1055   GFRNONAA >60 03/23/2023 1134   GFRAA 123 03/05/2017 1055       Component Value Date/Time   WBC 8.6 03/23/2023 1001   RBC 4.80 03/23/2023 1001   HGB 13.8 03/23/2023 1001   HGB 12.5 05/15/2016 1156   HCT 41.9 03/23/2023 1001   HCT 38.4 05/15/2016 1156   PLT 272 03/23/2023 1001   MCV 87.3 03/23/2023 1001   MCV 85 05/15/2016 1156   MCH 28.8 03/23/2023 1001   MCHC 32.9 03/23/2023 1001   RDW 14.2 03/23/2023 1001   RDW 14.1 05/15/2016 1156   LYMPHSABS 3.0 03/23/2023 1001   LYMPHSABS 2.9 05/15/2016 1156   MONOABS 0.5 03/23/2023 1001   EOSABS 0.1 03/23/2023 1001   EOSABS 0.1 05/15/2016 1156   BASOSABS 0.1 03/23/2023 1001   BASOSABS 0.1 05/15/2016 1156    No results found for: POCLITH, LITHIUM    No results found for: PHENYTOIN, PHENOBARB, VALPROATE,  CBMZ   .res Assessment: Plan:    Plan:  Patient totally disabled and unable to work at this time. Will take patient out of work 03/08/2024 through 03/25/2024.  Increase Vyvanse  30mg  to 60mg  daily Propranolol  10mg  BID  Clomipramine  50mg  at hs - requested lab 03/08/2024 Prozac  40mg  daily  Clonazepam  0.5mg  - 2 at hs Dextroamphetamine  10mg  daily when working  Olanzapine  10mg  at hs for sleep and mood symptoms  Therapist - Almarie Sprang  RTC 4 weeks  25 minutes spent dedicated to the care of this patient on the date of this encounter to include pre-visit review of records, ordering of medication, post visit documentation, and face-to-face time with the patient discussing ADHD, MDD, BED, panic disorder, emotional lability, and physiological insomnia. Discussed continuing current medication regimen.  Discussed potential benefits, risks, and side effects of stimulants with patient to include increased heart rate, palpitations, insomnia, increased anxiety, increased irritability, or decreased appetite.  Instructed patient to contact office if experiencing any significant tolerability issues.  Discussed potential benefits, risk, and side effects of benzodiazepines to include potential risk of tolerance and dependence, as well as possible drowsiness.  Advised patient not to drive if experiencing drowsiness and to take lowest possible effective dose to minimize risk of dependence and tolerance.  Patient advised to contact office with any questions, adverse effects, or acute worsening in signs and symptoms.  There are no diagnoses linked to this encounter.   Please see After Visit Summary for patient specific instructions.  Future Appointments  Date Time Provider Department Center  03/08/2024  4:00 PM Briellah Baik, Angeline Mattocks, NP CP-[facility] None  04/01/2024 11:00 AM Sprang Almarie DASEN [facility] None  04/13/2024  4:30 PM Perle Gibbon, Angeline Mattocks, NP [facility] None  05/06/2024  2:00 PM  Sprang Almarie DASEN [facility] None  06/06/2024  4:00 PM Sprang Almarie DASEN, [facility] CP-[facility] None    No orders of the defined types were placed in this encounter.   -------------------------------

## 2024-03-10 NOTE — Telephone Encounter (Signed)
 PA not needed

## 2024-03-10 NOTE — Telephone Encounter (Signed)
 Vyvanse  60 mg sent at patient's appt in October.

## 2024-03-17 ENCOUNTER — Telehealth: Payer: Self-pay | Admitting: Adult Health

## 2024-03-17 ENCOUNTER — Telehealth: Admitting: Adult Health

## 2024-03-17 NOTE — Telephone Encounter (Signed)
 Pt states Gina increased her Propanolol last week and she is not feeling any effect from it. Wanted to speak with the nurse.

## 2024-03-21 LAB — CLOMIPRAMINE
Clomipramine.: 177 ug/L (ref 50–250)
Desemethylclomipramine: 42 ug/L — ABNORMAL LOW (ref 150–350)
Tot Clomipramine+Desmethylclomipramine: 219 ug/L (ref 200–600)

## 2024-03-21 NOTE — Telephone Encounter (Signed)
 Reviewed recommendations with patient. She asks if you will write her out for another week. She said she is supposed to go back 11/1.

## 2024-03-21 NOTE — Telephone Encounter (Signed)
 Pt called back this morning at 9:12a stating she had not heard back from [place] about this.  Pls advise.

## 2024-03-21 NOTE — Telephone Encounter (Signed)
 Last seen 10/14:  Increase Vyvanse  30mg  to 60mg  daily Propranolol  10mg  BID   Clomipramine  50mg  at hs - requested lab 03/08/2024 Prozac  40mg  daily   Clonazepam  0.5mg  - 2 at hs Dextroamphetamine  10mg  daily when working   Olanzapine  10mg  at hs for sleep and mood symptoms  Pt said the increased propranolol  is not effective. She is reporting anxiety at 8/10. Her grandmother is still in the hospital, should be discharged Wednesday. She said she has to be by her mother all day or she panics.

## 2024-03-22 NOTE — Telephone Encounter (Signed)
 Notified patient that [facility] would write her out for another week as requested, but she said she thought she would try to go back on 11/1.

## 2024-03-24 ENCOUNTER — Telehealth: Payer: Self-pay | Admitting: Adult Health

## 2024-03-24 NOTE — Telephone Encounter (Signed)
 Patient lvm requesting a return call from the nurse. Appointment scheduled for 03/25/24 # [phone]

## 2024-03-24 NOTE — Telephone Encounter (Signed)
 Called patient and she has appt with [facility] tomorrow.

## 2024-03-25 ENCOUNTER — Telehealth: Admitting: Adult Health

## 2024-03-25 ENCOUNTER — Encounter: Payer: Self-pay | Admitting: Adult Health

## 2024-03-25 DIAGNOSIS — F321 Major depressive disorder, single episode, moderate: Secondary | ICD-10-CM

## 2024-03-25 DIAGNOSIS — F329 Major depressive disorder, single episode, unspecified: Secondary | ICD-10-CM

## 2024-03-25 DIAGNOSIS — G4700 Insomnia, unspecified: Secondary | ICD-10-CM | POA: Diagnosis not present

## 2024-03-25 DIAGNOSIS — F902 Attention-deficit hyperactivity disorder, combined type: Secondary | ICD-10-CM

## 2024-03-25 DIAGNOSIS — F909 Attention-deficit hyperactivity disorder, unspecified type: Secondary | ICD-10-CM

## 2024-03-25 DIAGNOSIS — F410 Panic disorder [episodic paroxysmal anxiety] without agoraphobia: Secondary | ICD-10-CM | POA: Diagnosis not present

## 2024-03-25 NOTE — Progress Notes (Signed)
 Laura Perez [id number] [birth year] 33 y.o.  Virtual Visit via Video Note  I connected with pt @ on 03/25/24 at  8:00 AM EDT by a video enabled telemedicine application and verified that I am speaking with the correct person using two identifiers.   I discussed the limitations of evaluation and management by telemedicine and the availability of in person appointments. The patient expressed understanding and agreed to proceed.  I discussed the assessment and treatment plan with the patient. The patient was provided an opportunity to ask questions and all were answered. The patient agreed with the plan and demonstrated an understanding of the instructions.   The patient was advised to call back or seek an in-person evaluation if the symptoms worsen or if the condition fails to improve as anticipated.  I provided 25 minutes of non-face-to-face time during this encounter.  The patient was located at home.  The provider was located at [facility].   Laura LOISE Sayers, NP   Subjective:   Patient ID:  Laura Perez is a 33 y.o. (DOB 22-Feb-1991) female.  Chief Complaint: No chief complaint on file.   HPI Laura Perez presents for follow-up of ADHD, MDD, BED, panic disorder, emotional lability, and physiological insomnia.  Accompanied by both parents.  Describes mood today as about the same. Pleasant. Reports tearfulness. Mood symptoms - reports anxiety, depression and irritability.  Reports varying interest and motivation. Reports panic attacks - primarily associated with returning to work. Reports some worry - work related. Reports rumination and over thinking. Reports obsessive thoughts. Reports mood as lower. Stating I am nervous about going to work and that is something I have to get over. Reports medications are helpful. Taking medications as prescribed.  Energy levels lower. Active, does not have a regular exercise routine.  Unable to enjoy usual interests and  activities. Lives with parents - 2 dogs and 1 cat. Spending time with family and her best friends.  Appetite adequate. Weight stable. Reports sleeping well most nights. Averages 8 to 9 hours. Reports napping during the day. Reports focus and concentration stable. Completing tasks. Managing aspects of household. Working at [employer] part time.   Denies SI or HI.  Denies AH or VH.  Denies self harm. Denies substance use.  Previous medication trials: Zoloft, lamictal , Celexa, Elavil   Review of Systems:  Review of Systems  Musculoskeletal:  Negative for gait problem.  Neurological:  Negative for tremors.  Psychiatric/Behavioral:         Please refer to HPI    Medications: I have reviewed the patient's current medications.  Current Outpatient Medications  Medication Sig Dispense Refill   beclomethasone (QVAR) 80 MCG/ACT inhaler Inhale 1 puff into the lungs daily as needed (wheezing).     clomiPRAMINE  (ANAFRANIL ) 50 MG capsule TAKE 1 CAPSULE BY MOUTH AT BEDTIME 90 capsule 0   clonazePAM  (KLONOPIN ) 0.5 MG tablet TAKE 1 TABLET BY MOUTH 2 TIMES A DAY 60 tablet 2   diclofenac  (VOLTAREN ) 75 MG EC tablet Take 1 tablet (75 mg total) by mouth 2 (two) times daily. (Patient not taking: No sig reported) 60 tablet 1   dicyclomine  (BENTYL ) 20 MG tablet Take 1 tablet (20 mg total) by mouth 2 (two) times daily. 20 tablet 0   FLUoxetine  (PROZAC ) 40 MG capsule TAKE 1 CAPSULE BY MOUTH AT BEDTIME 90 capsule 1   hydrocortisone  (ANUSOL -HC) 25 MG suppository Place 1 suppository (25 mg total) rectally 2 (two) times daily. 12 suppository 0   Ibuprofen -diphenhydrAMINE Cit (ADVIL   PM PO) Take 2 tablets by mouth daily as needed (pain).     lisdexamfetamine (VYVANSE ) 60 MG capsule Take 1 capsule (60 mg total) by mouth daily. 30 capsule 0   loratadine  (CLARITIN ) 10 MG tablet Take 1 tablet (10 mg total) by mouth daily. 15 tablet 0   metFORMIN (GLUCOPHAGE-XR) 500 MG 24 hr tablet Take 1,000 mg by mouth 2 (two) times  daily.     metoCLOPramide  (REGLAN ) 10 MG tablet Take 1 tablet (10 mg total) by mouth every 6 (six) hours. 8 tablet 0   Laura Perez 3-0.02 MG tablet Take 1 tablet by mouth daily.     NURTEC 75 MG TBDP Take 1 tablet by mouth daily as needed.     OLANZapine  (ZYPREXA ) 10 MG tablet Take 1 tablet (10 mg total) by mouth at bedtime. 30 tablet 2   ondansetron  (ZOFRAN  ODT) 4 MG disintegrating tablet Take one tab by mouth Q6hr prn nausea (Dissolve under tongue) (Patient taking differently: Take 4 mg by mouth every 8 (eight) hours as needed for nausea or vomiting.) 12 tablet 0   OZEMPIC, 0.25 OR 0.5 MG/DOSE, 2 MG/1.5ML SOPN Inject 0.5 mg into the skin once a week. Friday     pantoprazole  (PROTONIX ) 20 MG tablet Take 1 tablet (20 mg total) by mouth daily. 20 tablet 0   propranolol  (INDERAL ) 10 MG tablet Take 1 tablet (10 mg total) by mouth 3 (three) times daily as needed. 90 tablet 2   topiramate (TOPAMAX) 25 MG tablet Take 25 mg by mouth 2 (two) times daily.     VIBERZI 100 MG TABS Take 1 tablet by mouth 2 (two) times daily.     No current facility-administered medications for this visit.    Medication Side Effects: None  Allergies:  Allergies  Allergen Reactions   Imitrex [Sumatriptan] Shortness Of Breath    Past Medical History:  Diagnosis Date   ADHD    Anxiety    Asthma    Depression    GERD (gastroesophageal reflux disease)    IBS (irritable bowel syndrome)    PCOS (polycystic ovarian syndrome)     Family History  Adopted: Yes    Social History   Socioeconomic History   Marital status: Single    Spouse name: Not on file   Number of children: Not on file   Years of education: Not on file   Highest education level: Not on file  Occupational History   Occupation: Bagger     Employer: [employer]  Tobacco Use   Smoking status: Never   Smokeless tobacco: Never  Vaping Use   Vaping status: Never Used  Substance and Sexual Activity   Alcohol use: No    Alcohol/week: 0.0 standard  drinks of alcohol   Drug use: Never   Sexual activity: Not on file  Other Topics Concern   Not on file  Social History Narrative   ** Merged History Encounter **       Social Drivers of Health   Financial Resource Strain: Low Risk  (03/09/2021)   Received from [facility]   Overall Financial Resource Strain (CARDIA)    Difficulty of Paying Living Expenses: Not very hard  Food Insecurity: Unknown (03/09/2021)   Received from [facility]   Hunger Vital Sign    Within the past 12 months, you worried that your food would run out before you got the money to buy more.: Patient declined    Within the past 12 months, the food you bought  just didn't last and you didn't have money to get more.: Patient declined  Transportation Needs: No Transportation Needs (03/09/2021)   Received from [facility]   PRAPARE - Transportation    Lack of Transportation (Medical): No    Lack of Transportation (Non-Medical): No  Physical Activity: Insufficiently Active (03/09/2021)   Received from [facility]   Exercise Vital Sign    On average, how many days per week do you engage in moderate to strenuous exercise (like a brisk walk)?: 2 days    On average, how many minutes do you engage in exercise at this level?: 70 min  Stress: Stress Concern Present (03/09/2021)   Received from [facility] - Occupational Stress Questionnaire    Feeling of Stress : Very much  Social Connections: Unknown (10/08/2021)   Received from [facility]   Social Network    Social Network: Not on file  Intimate Partner Violence: Unknown (08/30/2021)   Received from [facility]   HITS    Physically Hurt: Not on file    Insult or Talk Down To: Not on file    Threaten Physical Harm: Not on file    Scream or Curse: Not on file    Past Medical History, Surgical history, Social history, and Family history were reviewed and updated as appropriate.   Please see review of  systems for further details on the patient's review from today.   Objective:   Physical Exam:  There were no vitals taken for this visit.  Physical Exam Constitutional:      General: She is not in acute distress. Musculoskeletal:        General: No deformity.  Neurological:     Mental Status: She is alert and oriented to person, place, and time.     Coordination: Coordination normal.  Psychiatric:        Attention and Perception: Attention and perception normal. She does not perceive auditory or visual hallucinations.        Mood and Affect: Mood normal. Mood is not anxious or depressed. Affect is not labile, blunt, angry or inappropriate.        Speech: Speech normal.        Behavior: Behavior normal.        Thought Content: Thought content normal. Thought content is not paranoid or delusional. Thought content does not include homicidal or suicidal ideation. Thought content does not include homicidal or suicidal plan.        Cognition and Memory: Cognition and memory normal.        Judgment: Judgment normal.     Comments: Insight intact     Lab Review:     Component Value Date/Time   NA 136 03/23/2023 1134   NA 139 03/05/2017 1055   K 4.2 03/23/2023 1134   CL 107 03/23/2023 1134   CO2 20 (L) 03/23/2023 1134   GLUCOSE 89 03/23/2023 1134   BUN 13 03/23/2023 1134   BUN 14 03/05/2017 1055   CREATININE 0.88 03/23/2023 1134   CALCIUM 9.5 03/23/2023 1134   PROT 7.1 03/23/2023 1134   PROT 6.9 03/05/2017 1055   ALBUMIN 4.1 03/23/2023 1134   ALBUMIN 4.2 03/05/2017 1055   AST 13 (L) 03/23/2023 1134   ALT 10 03/23/2023 1134   ALKPHOS 74 03/23/2023 1134   BILITOT 0.3 03/23/2023 1134   BILITOT 0.3 03/05/2017 1055   GFRNONAA >60 03/23/2023 1134   GFRAA 123 03/05/2017 1055  Component Value Date/Time   WBC 8.6 03/23/2023 1001   RBC 4.80 03/23/2023 1001   HGB 13.8 03/23/2023 1001   HGB 12.5 05/15/2016 1156   HCT 41.9 03/23/2023 1001   HCT 38.4 05/15/2016 1156   PLT  272 03/23/2023 1001   MCV 87.3 03/23/2023 1001   MCV 85 05/15/2016 1156   MCH 28.8 03/23/2023 1001   MCHC 32.9 03/23/2023 1001   RDW 14.2 03/23/2023 1001   RDW 14.1 05/15/2016 1156   LYMPHSABS 3.0 03/23/2023 1001   LYMPHSABS 2.9 05/15/2016 1156   MONOABS 0.5 03/23/2023 1001   EOSABS 0.1 03/23/2023 1001   EOSABS 0.1 05/15/2016 1156   BASOSABS 0.1 03/23/2023 1001   BASOSABS 0.1 05/15/2016 1156    No results found for: POCLITH, LITHIUM    No results found for: PHENYTOIN, PHENOBARB, VALPROATE, CBMZ   .res Assessment: Plan:    Plan:  Patient may return to work on 03/26/2024. Plans to reach out to employer.    Vyvanse  60mg  daily Propranolol  10mg  TID  Clomipramine  50mg  at hs - requested lab 03/08/2024 Prozac  40mg  daily  Clonazepam  0.5mg  - 2 at hs Dextroamphetamine  10mg  daily when working  Olanzapine  10mg  at hs for sleep and mood symptoms  Therapist - Almarie Sprang  RTC 4 weeks  25 minutes spent dedicated to the care of this patient on the date of this encounter to include pre-visit review of records, ordering of medication, post visit documentation, and face-to-face time with the patient discussing ADHD, MDD, BED, panic disorder, emotional lability, and physiological insomnia. Discussed continuing current medication regimen.  Discussed potential benefits, risks, and side effects of stimulants with patient to include increased heart rate, palpitations, insomnia, increased anxiety, increased irritability, or decreased appetite.  Instructed patient to contact office if experiencing any significant tolerability issues.  Discussed potential benefits, risk, and side effects of benzodiazepines to include potential risk of tolerance and dependence, as well as possible drowsiness. Advised patient not to drive if experiencing drowsiness and to take lowest possible effective dose to minimize risk of dependence and tolerance.  Patient advised to contact office with any  questions, adverse effects, or acute worsening in signs and symptoms.  There are no diagnoses linked to this encounter.   Please see After Visit Summary for patient specific instructions.  Future Appointments  Date Time Provider Department Center  03/25/2024  8:00 AM Melitta Tigue, Laura Mattocks, NP [facility] None  04/01/2024 11:00 AM Sprang Almarie DASEN [facility] None  04/13/2024  4:30 PM Brecken Dewoody, Laura Mattocks, NP [facility] None  06/06/2024  4:00 PM Sprang Almarie DASEN, [facility] None    No orders of the defined types were placed in this encounter.     -------------------------------

## 2024-04-01 ENCOUNTER — Ambulatory Visit: Admitting: Professional Counselor

## 2024-04-10 ENCOUNTER — Other Ambulatory Visit: Payer: Self-pay | Admitting: Adult Health

## 2024-04-10 DIAGNOSIS — F321 Major depressive disorder, single episode, moderate: Secondary | ICD-10-CM

## 2024-04-13 ENCOUNTER — Ambulatory Visit: Admitting: Adult Health

## 2024-04-14 NOTE — Telephone Encounter (Signed)
 Pt last seen 10/31 with RTC in 4 weeks. Please call to schedule FU.

## 2024-04-20 NOTE — Telephone Encounter (Signed)
 Spoke with patient unable to schedule appts having some family issues.

## 2024-04-20 NOTE — Telephone Encounter (Signed)
 Pt reports family issues and unable to schedule her 4 week follow up from 10/31.

## 2024-05-06 ENCOUNTER — Ambulatory Visit: Admitting: Professional Counselor

## 2024-05-10 ENCOUNTER — Other Ambulatory Visit: Payer: Self-pay | Admitting: Adult Health

## 2024-05-10 DIAGNOSIS — F321 Major depressive disorder, single episode, moderate: Secondary | ICD-10-CM

## 2024-05-25 ENCOUNTER — Other Ambulatory Visit (HOSPITAL_COMMUNITY): Payer: Self-pay

## 2024-06-06 ENCOUNTER — Ambulatory Visit: Admitting: Professional Counselor

## 2024-06-14 ENCOUNTER — Telehealth: Payer: Self-pay

## 2024-06-14 NOTE — Telephone Encounter (Signed)
 PA approved Olanzapine  10 mg tablets #30/30 day 06/11/24-06/11/25 [employer] [place] [id number]
